# Patient Record
Sex: Female | Born: 1940 | Race: White | Hispanic: No | Smoking: Former smoker
Health system: Southern US, Community
[De-identification: ages and names within clinical notes are randomized; demographics above are authoritative.]

## PROBLEM LIST (undated history)

## (undated) DIAGNOSIS — E785 Hyperlipidemia, unspecified: Secondary | ICD-10-CM

## (undated) DIAGNOSIS — F329 Major depressive disorder, single episode, unspecified: Secondary | ICD-10-CM

## (undated) DIAGNOSIS — F419 Anxiety disorder, unspecified: Secondary | ICD-10-CM

## (undated) DIAGNOSIS — I499 Cardiac arrhythmia, unspecified: Secondary | ICD-10-CM

## (undated) DIAGNOSIS — I110 Hypertensive heart disease with heart failure: Secondary | ICD-10-CM

## (undated) DIAGNOSIS — Z720 Tobacco use: Secondary | ICD-10-CM

## (undated) DIAGNOSIS — I5032 Chronic diastolic (congestive) heart failure: Secondary | ICD-10-CM

## (undated) DIAGNOSIS — J449 Chronic obstructive pulmonary disease, unspecified: Secondary | ICD-10-CM

## (undated) DIAGNOSIS — I10 Essential (primary) hypertension: Secondary | ICD-10-CM

## (undated) DIAGNOSIS — E119 Type 2 diabetes mellitus without complications: Secondary | ICD-10-CM

## (undated) DIAGNOSIS — K219 Gastro-esophageal reflux disease without esophagitis: Secondary | ICD-10-CM

## (undated) DIAGNOSIS — I480 Paroxysmal atrial fibrillation: Secondary | ICD-10-CM

## (undated) DIAGNOSIS — F32A Depression, unspecified: Secondary | ICD-10-CM

## (undated) DIAGNOSIS — N289 Disorder of kidney and ureter, unspecified: Secondary | ICD-10-CM

## (undated) DIAGNOSIS — R251 Tremor, unspecified: Secondary | ICD-10-CM

## (undated) HISTORY — DX: Hyperlipidemia, unspecified: E78.5

## (undated) HISTORY — PX: NOSE SURGERY: SHX723

## (undated) HISTORY — DX: Hypertensive heart disease with heart failure: I11.0

## (undated) HISTORY — DX: Paroxysmal atrial fibrillation: I48.0

## (undated) HISTORY — DX: Chronic obstructive pulmonary disease, unspecified: J44.9

## (undated) HISTORY — PX: ABDOMINAL HYSTERECTOMY: SHX81

## (undated) HISTORY — DX: Essential (primary) hypertension: I10

## (undated) HISTORY — DX: Gastro-esophageal reflux disease without esophagitis: K21.9

## (undated) HISTORY — DX: Tobacco use: Z72.0

## (undated) HISTORY — DX: Chronic diastolic (congestive) heart failure: I50.32

---

## 2010-10-25 HISTORY — PX: CORONARY ARTERY BYPASS GRAFT: SHX141

## 2011-07-12 DIAGNOSIS — I2510 Atherosclerotic heart disease of native coronary artery without angina pectoris: Secondary | ICD-10-CM

## 2011-08-25 ENCOUNTER — Ambulatory Visit (INDEPENDENT_AMBULATORY_CARE_PROVIDER_SITE_OTHER): Payer: Medicare Other | Admitting: Physician Assistant

## 2011-08-25 ENCOUNTER — Encounter: Payer: Self-pay | Admitting: Thoracic Surgery (Cardiothoracic Vascular Surgery)

## 2011-08-25 DIAGNOSIS — Z951 Presence of aortocoronary bypass graft: Secondary | ICD-10-CM

## 2011-08-25 DIAGNOSIS — I2510 Atherosclerotic heart disease of native coronary artery without angina pectoris: Secondary | ICD-10-CM

## 2011-08-25 NOTE — Progress Notes (Signed)
[name] return to the office today for scheduled followup after three-vessel coronary bypass grafting by Dr. 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 She has a history of congestive heart failure at the time of her left heart catheterization. She also has a history of atrial fibrillation. Her postoperative course was uneventful. She was discharged home on amiodarone, aspirin, Lipitor, Coreg, hydrochlorothiazide, quinapril, and her pulmonary drugs for COPD including sertraline and Singulair. Since her discharge home she is made steady progress. She continues to have some shortness of breath with activity but she reports this is not unlike her symptoms prior to the operation and are related to her long-standing smoking history. She denies any angina. Her appetite is good.  On exam today her heart is in a regular rate and rhythm. She has a Holter monitor attached which has been in place for the past 6 days and is being evaluated by Dr. mildly. Her amiodarone was discontinued one week ago. Her breath sounds are clear to auscultation. The sternotomy incision is intact healing with no complications. She has no peripheral edema. Plan sternal precautions are reviewed. No further followup scheduled with her office. She is scheduled to see Dr. mildly next week. I asked her to contact us if we can be of any further assistance with her care.

## 2011-09-28 ENCOUNTER — Encounter: Payer: Self-pay | Admitting: *Deleted

## 2014-10-25 HISTORY — PX: CATARACT EXTRACTION: SUR2

## 2015-06-09 DIAGNOSIS — I110 Hypertensive heart disease with heart failure: Secondary | ICD-10-CM | POA: Insufficient documentation

## 2015-06-09 DIAGNOSIS — I480 Paroxysmal atrial fibrillation: Secondary | ICD-10-CM

## 2015-06-09 DIAGNOSIS — I5032 Chronic diastolic (congestive) heart failure: Secondary | ICD-10-CM

## 2015-06-09 HISTORY — DX: Hypertensive heart disease with heart failure: I11.0

## 2015-06-09 HISTORY — DX: Paroxysmal atrial fibrillation: I48.0

## 2015-06-09 HISTORY — DX: Chronic diastolic (congestive) heart failure: I50.32

## 2016-01-05 DIAGNOSIS — R413 Other amnesia: Secondary | ICD-10-CM | POA: Diagnosis not present

## 2016-01-05 DIAGNOSIS — Z0000 Encounter for general adult medical examination without abnormal findings: Secondary | ICD-10-CM | POA: Diagnosis not present

## 2016-01-05 DIAGNOSIS — Z6837 Body mass index (BMI) 37.0-37.9, adult: Secondary | ICD-10-CM | POA: Diagnosis not present

## 2016-01-05 DIAGNOSIS — F39 Unspecified mood [affective] disorder: Secondary | ICD-10-CM | POA: Diagnosis not present

## 2016-01-05 DIAGNOSIS — I10 Essential (primary) hypertension: Secondary | ICD-10-CM | POA: Diagnosis not present

## 2016-01-05 DIAGNOSIS — E119 Type 2 diabetes mellitus without complications: Secondary | ICD-10-CM | POA: Diagnosis not present

## 2016-01-05 DIAGNOSIS — Z1231 Encounter for screening mammogram for malignant neoplasm of breast: Secondary | ICD-10-CM | POA: Diagnosis not present

## 2016-01-05 DIAGNOSIS — E559 Vitamin D deficiency, unspecified: Secondary | ICD-10-CM | POA: Diagnosis not present

## 2016-01-05 DIAGNOSIS — E785 Hyperlipidemia, unspecified: Secondary | ICD-10-CM | POA: Diagnosis not present

## 2016-01-05 DIAGNOSIS — Z1389 Encounter for screening for other disorder: Secondary | ICD-10-CM | POA: Diagnosis not present

## 2016-01-05 DIAGNOSIS — K219 Gastro-esophageal reflux disease without esophagitis: Secondary | ICD-10-CM | POA: Diagnosis not present

## 2016-01-15 DIAGNOSIS — Z1231 Encounter for screening mammogram for malignant neoplasm of breast: Secondary | ICD-10-CM | POA: Diagnosis not present

## 2016-01-26 DIAGNOSIS — R928 Other abnormal and inconclusive findings on diagnostic imaging of breast: Secondary | ICD-10-CM | POA: Diagnosis not present

## 2016-01-26 DIAGNOSIS — N6489 Other specified disorders of breast: Secondary | ICD-10-CM | POA: Diagnosis not present

## 2016-01-26 DIAGNOSIS — N641 Fat necrosis of breast: Secondary | ICD-10-CM | POA: Diagnosis not present

## 2016-05-06 DIAGNOSIS — Z6836 Body mass index (BMI) 36.0-36.9, adult: Secondary | ICD-10-CM | POA: Diagnosis not present

## 2016-05-06 DIAGNOSIS — M1990 Unspecified osteoarthritis, unspecified site: Secondary | ICD-10-CM | POA: Diagnosis not present

## 2016-05-06 DIAGNOSIS — M79675 Pain in left toe(s): Secondary | ICD-10-CM | POA: Diagnosis not present

## 2016-05-06 DIAGNOSIS — E119 Type 2 diabetes mellitus without complications: Secondary | ICD-10-CM | POA: Diagnosis not present

## 2016-05-06 DIAGNOSIS — S92532A Displaced fracture of distal phalanx of left lesser toe(s), initial encounter for closed fracture: Secondary | ICD-10-CM | POA: Diagnosis not present

## 2016-05-06 DIAGNOSIS — E669 Obesity, unspecified: Secondary | ICD-10-CM | POA: Diagnosis not present

## 2016-05-06 DIAGNOSIS — I10 Essential (primary) hypertension: Secondary | ICD-10-CM | POA: Diagnosis not present

## 2016-05-06 DIAGNOSIS — X58XXXA Exposure to other specified factors, initial encounter: Secondary | ICD-10-CM | POA: Diagnosis not present

## 2016-05-06 DIAGNOSIS — E785 Hyperlipidemia, unspecified: Secondary | ICD-10-CM | POA: Diagnosis not present

## 2016-05-06 DIAGNOSIS — E1140 Type 2 diabetes mellitus with diabetic neuropathy, unspecified: Secondary | ICD-10-CM | POA: Diagnosis not present

## 2016-05-24 DIAGNOSIS — S92502A Displaced unspecified fracture of left lesser toe(s), initial encounter for closed fracture: Secondary | ICD-10-CM | POA: Diagnosis not present

## 2016-05-28 DIAGNOSIS — Z13820 Encounter for screening for osteoporosis: Secondary | ICD-10-CM | POA: Diagnosis not present

## 2016-05-28 DIAGNOSIS — M810 Age-related osteoporosis without current pathological fracture: Secondary | ICD-10-CM | POA: Diagnosis not present

## 2016-06-02 DIAGNOSIS — R270 Ataxia, unspecified: Secondary | ICD-10-CM | POA: Diagnosis not present

## 2016-06-02 DIAGNOSIS — S92912A Unspecified fracture of left toe(s), initial encounter for closed fracture: Secondary | ICD-10-CM | POA: Diagnosis not present

## 2016-06-02 DIAGNOSIS — E1140 Type 2 diabetes mellitus with diabetic neuropathy, unspecified: Secondary | ICD-10-CM | POA: Diagnosis not present

## 2016-06-02 DIAGNOSIS — Z6836 Body mass index (BMI) 36.0-36.9, adult: Secondary | ICD-10-CM | POA: Diagnosis not present

## 2016-06-02 DIAGNOSIS — R252 Cramp and spasm: Secondary | ICD-10-CM | POA: Diagnosis not present

## 2016-06-14 DIAGNOSIS — Z9181 History of falling: Secondary | ICD-10-CM | POA: Diagnosis not present

## 2016-06-14 DIAGNOSIS — R270 Ataxia, unspecified: Secondary | ICD-10-CM | POA: Diagnosis not present

## 2016-06-29 DIAGNOSIS — Z9181 History of falling: Secondary | ICD-10-CM | POA: Diagnosis not present

## 2016-06-29 DIAGNOSIS — R270 Ataxia, unspecified: Secondary | ICD-10-CM | POA: Diagnosis not present

## 2016-07-08 DIAGNOSIS — I480 Paroxysmal atrial fibrillation: Secondary | ICD-10-CM | POA: Diagnosis not present

## 2016-07-08 DIAGNOSIS — Z951 Presence of aortocoronary bypass graft: Secondary | ICD-10-CM | POA: Diagnosis not present

## 2016-07-08 DIAGNOSIS — I110 Hypertensive heart disease with heart failure: Secondary | ICD-10-CM | POA: Diagnosis not present

## 2016-07-08 DIAGNOSIS — I5032 Chronic diastolic (congestive) heart failure: Secondary | ICD-10-CM | POA: Diagnosis not present

## 2016-07-26 DIAGNOSIS — R270 Ataxia, unspecified: Secondary | ICD-10-CM | POA: Diagnosis not present

## 2016-07-26 DIAGNOSIS — Z9181 History of falling: Secondary | ICD-10-CM | POA: Diagnosis not present

## 2016-08-10 DIAGNOSIS — J019 Acute sinusitis, unspecified: Secondary | ICD-10-CM | POA: Diagnosis not present

## 2016-08-10 DIAGNOSIS — M1990 Unspecified osteoarthritis, unspecified site: Secondary | ICD-10-CM | POA: Diagnosis not present

## 2016-08-10 DIAGNOSIS — J209 Acute bronchitis, unspecified: Secondary | ICD-10-CM | POA: Diagnosis not present

## 2016-08-10 DIAGNOSIS — Z6835 Body mass index (BMI) 35.0-35.9, adult: Secondary | ICD-10-CM | POA: Diagnosis not present

## 2016-08-10 DIAGNOSIS — M791 Myalgia: Secondary | ICD-10-CM | POA: Diagnosis not present

## 2016-08-10 DIAGNOSIS — E669 Obesity, unspecified: Secondary | ICD-10-CM | POA: Diagnosis not present

## 2016-09-02 DIAGNOSIS — I10 Essential (primary) hypertension: Secondary | ICD-10-CM | POA: Diagnosis not present

## 2016-09-02 DIAGNOSIS — E559 Vitamin D deficiency, unspecified: Secondary | ICD-10-CM | POA: Diagnosis not present

## 2016-09-02 DIAGNOSIS — Z79899 Other long term (current) drug therapy: Secondary | ICD-10-CM | POA: Diagnosis not present

## 2016-09-02 DIAGNOSIS — E669 Obesity, unspecified: Secondary | ICD-10-CM | POA: Diagnosis not present

## 2016-09-02 DIAGNOSIS — Z6835 Body mass index (BMI) 35.0-35.9, adult: Secondary | ICD-10-CM | POA: Diagnosis not present

## 2016-09-02 DIAGNOSIS — M1990 Unspecified osteoarthritis, unspecified site: Secondary | ICD-10-CM | POA: Diagnosis not present

## 2016-09-02 DIAGNOSIS — Z23 Encounter for immunization: Secondary | ICD-10-CM | POA: Diagnosis not present

## 2016-09-02 DIAGNOSIS — J309 Allergic rhinitis, unspecified: Secondary | ICD-10-CM | POA: Diagnosis not present

## 2016-09-02 DIAGNOSIS — E1140 Type 2 diabetes mellitus with diabetic neuropathy, unspecified: Secondary | ICD-10-CM | POA: Diagnosis not present

## 2016-10-12 DIAGNOSIS — H905 Unspecified sensorineural hearing loss: Secondary | ICD-10-CM | POA: Diagnosis not present

## 2016-10-12 DIAGNOSIS — H9193 Unspecified hearing loss, bilateral: Secondary | ICD-10-CM | POA: Diagnosis not present

## 2016-10-27 DIAGNOSIS — H9072 Mixed conductive and sensorineural hearing loss, unilateral, left ear, with unrestricted hearing on the contralateral side: Secondary | ICD-10-CM | POA: Diagnosis not present

## 2016-10-27 DIAGNOSIS — H903 Sensorineural hearing loss, bilateral: Secondary | ICD-10-CM | POA: Diagnosis not present

## 2017-01-03 DIAGNOSIS — E1140 Type 2 diabetes mellitus with diabetic neuropathy, unspecified: Secondary | ICD-10-CM | POA: Diagnosis not present

## 2017-01-03 DIAGNOSIS — J449 Chronic obstructive pulmonary disease, unspecified: Secondary | ICD-10-CM | POA: Diagnosis not present

## 2017-01-03 DIAGNOSIS — I10 Essential (primary) hypertension: Secondary | ICD-10-CM | POA: Diagnosis not present

## 2017-01-03 DIAGNOSIS — E669 Obesity, unspecified: Secondary | ICD-10-CM | POA: Diagnosis not present

## 2017-01-03 DIAGNOSIS — E785 Hyperlipidemia, unspecified: Secondary | ICD-10-CM | POA: Diagnosis not present

## 2017-01-03 DIAGNOSIS — M1990 Unspecified osteoarthritis, unspecified site: Secondary | ICD-10-CM | POA: Diagnosis not present

## 2017-01-03 DIAGNOSIS — Z6835 Body mass index (BMI) 35.0-35.9, adult: Secondary | ICD-10-CM | POA: Diagnosis not present

## 2017-01-03 DIAGNOSIS — Z79899 Other long term (current) drug therapy: Secondary | ICD-10-CM | POA: Diagnosis not present

## 2017-01-03 DIAGNOSIS — Z9181 History of falling: Secondary | ICD-10-CM | POA: Diagnosis not present

## 2017-01-03 DIAGNOSIS — R413 Other amnesia: Secondary | ICD-10-CM | POA: Diagnosis not present

## 2017-01-03 DIAGNOSIS — E559 Vitamin D deficiency, unspecified: Secondary | ICD-10-CM | POA: Diagnosis not present

## 2017-01-03 DIAGNOSIS — J309 Allergic rhinitis, unspecified: Secondary | ICD-10-CM | POA: Diagnosis not present

## 2017-05-09 DIAGNOSIS — E119 Type 2 diabetes mellitus without complications: Secondary | ICD-10-CM | POA: Diagnosis not present

## 2017-05-09 DIAGNOSIS — F4321 Adjustment disorder with depressed mood: Secondary | ICD-10-CM | POA: Diagnosis not present

## 2017-05-09 DIAGNOSIS — E1140 Type 2 diabetes mellitus with diabetic neuropathy, unspecified: Secondary | ICD-10-CM | POA: Diagnosis not present

## 2017-05-09 DIAGNOSIS — Z6836 Body mass index (BMI) 36.0-36.9, adult: Secondary | ICD-10-CM | POA: Diagnosis not present

## 2017-05-09 DIAGNOSIS — E559 Vitamin D deficiency, unspecified: Secondary | ICD-10-CM | POA: Diagnosis not present

## 2017-05-09 DIAGNOSIS — J309 Allergic rhinitis, unspecified: Secondary | ICD-10-CM | POA: Diagnosis not present

## 2017-05-09 DIAGNOSIS — Z1389 Encounter for screening for other disorder: Secondary | ICD-10-CM | POA: Diagnosis not present

## 2017-05-09 DIAGNOSIS — E669 Obesity, unspecified: Secondary | ICD-10-CM | POA: Diagnosis not present

## 2017-05-09 DIAGNOSIS — K219 Gastro-esophageal reflux disease without esophagitis: Secondary | ICD-10-CM | POA: Diagnosis not present

## 2017-05-09 DIAGNOSIS — I2510 Atherosclerotic heart disease of native coronary artery without angina pectoris: Secondary | ICD-10-CM | POA: Diagnosis not present

## 2017-05-09 DIAGNOSIS — Z9181 History of falling: Secondary | ICD-10-CM | POA: Diagnosis not present

## 2017-05-09 DIAGNOSIS — E785 Hyperlipidemia, unspecified: Secondary | ICD-10-CM | POA: Diagnosis not present

## 2017-05-13 DIAGNOSIS — E785 Hyperlipidemia, unspecified: Secondary | ICD-10-CM | POA: Insufficient documentation

## 2017-05-13 DIAGNOSIS — I10 Essential (primary) hypertension: Secondary | ICD-10-CM | POA: Insufficient documentation

## 2017-05-13 DIAGNOSIS — K219 Gastro-esophageal reflux disease without esophagitis: Secondary | ICD-10-CM | POA: Insufficient documentation

## 2017-05-13 DIAGNOSIS — I6529 Occlusion and stenosis of unspecified carotid artery: Secondary | ICD-10-CM | POA: Insufficient documentation

## 2017-05-13 DIAGNOSIS — Z720 Tobacco use: Secondary | ICD-10-CM | POA: Insufficient documentation

## 2017-05-13 DIAGNOSIS — J449 Chronic obstructive pulmonary disease, unspecified: Secondary | ICD-10-CM | POA: Insufficient documentation

## 2017-05-26 ENCOUNTER — Other Ambulatory Visit: Payer: Self-pay

## 2017-05-29 ENCOUNTER — Encounter: Payer: Self-pay | Admitting: Cardiology

## 2017-05-29 DIAGNOSIS — I25119 Atherosclerotic heart disease of native coronary artery with unspecified angina pectoris: Secondary | ICD-10-CM

## 2017-05-29 HISTORY — DX: Atherosclerotic heart disease of native coronary artery with unspecified angina pectoris: I25.119

## 2017-05-30 ENCOUNTER — Encounter: Payer: Self-pay | Admitting: Cardiology

## 2017-05-30 ENCOUNTER — Ambulatory Visit (INDEPENDENT_AMBULATORY_CARE_PROVIDER_SITE_OTHER): Payer: PPO | Admitting: Cardiology

## 2017-05-30 VITALS — BP 140/52 | HR 61 | Ht 65.0 in | Wt 217.0 lb

## 2017-05-30 DIAGNOSIS — I480 Paroxysmal atrial fibrillation: Secondary | ICD-10-CM

## 2017-05-30 DIAGNOSIS — I5032 Chronic diastolic (congestive) heart failure: Secondary | ICD-10-CM | POA: Diagnosis not present

## 2017-05-30 DIAGNOSIS — I25119 Atherosclerotic heart disease of native coronary artery with unspecified angina pectoris: Secondary | ICD-10-CM | POA: Diagnosis not present

## 2017-05-30 DIAGNOSIS — I110 Hypertensive heart disease with heart failure: Secondary | ICD-10-CM | POA: Diagnosis not present

## 2017-05-30 MED ORDER — TORSEMIDE 20 MG PO TABS: 20.0000 mg | ORAL_TABLET | Freq: Every day | ORAL | 3 refills | Status: DC

## 2017-05-30 NOTE — Patient Instructions (Addendum)
Medication Instructions:  Your physician has recommended you make the following change in your medication:  STOP furosemide STOP hydrochlorothiazide START torsemide (Demadex) 20 mg daily   Labwork: None  Testing/Procedures: None  Follow-Up: Your physician wants you to follow-up in: 6 months. You will receive a reminder letter in the mail two months in advance. If you don't receive a letter, please call our office to schedule the follow-up appointment.   Any Other Special Instructions Will Be Listed Below (If Applicable).     If you need a refill on your cardiac medications before your next appointment, please call your pharmacy.    KNOW YOUR HEART FAILURE ZONES  Green Zone (Your Goal):  Marland Kitchen No shortness of breath or trouble bleeding . No weight gain of more than 3 pounds in one day or 5 pounds in a week . No swelling in your ankles, feet, stomach, or hands . No chest discomfort, heaviness or pain  Yellow Zone (Call the Doctor to get help!): Having 1 or more of the following: . Weight gain of 3 pounds in 1 day or 5 pounds in 1 week . More swelling of your feet, ankles, stomach, or hands . It is harder to breath when lying down. You need to sit up . Chest discomfort, heaviness, or pain . You feel more tired or have less energy than normal . New or worsening dizziness . Dry hacking cough . You feel uneasy and you know something is not right  Red Zone (Call 911-EMERGENCY): . Struggling to breathe. This does not go away when you sit up. . Stronger and more regular amounts of chest discomfort . New confusion or can't think clearly . Fainting or near fainting   Resources form the American heart Association: RetroDivas.ch.jsp

## 2017-05-30 NOTE — Progress Notes (Signed)
Cardiology Office Note:    Date:  05/30/2017   ID:  Cheryl Summers, DOB [birth year], MRN [id number]  PCP:  Nicholos Johns, MD  Cardiologist:  Cheryl More, MD    Referring MD: Nicholos Johns, MD    ASSESSMENT:    1. Chronic diastolic heart failure ([place])   2. Hypertensive heart disease with heart failure ([place])   3. PAF (paroxysmal atrial fibrillation) ([place])   4. Coronary artery disease involving native coronary artery of native heart with angina pectoris ([place])    PLAN:    In order of problems listed above:  1. Mildly decompensated she be switched once daily Summers potent diuretic and initiate sodium restriction compliance of medications and self-management with daily home weights 2. Stable continue current antihypertensive including beta blocker ACE inhibitor 3. Stable no clinical recurrence continue her beta blocker and aspirin for stroke prophylaxis 4. Stable course continue medical therapy aspirin or statin beta blocker   Next appointment: 6 months   Medication Adjustments/Labs and Tests Ordered: Current medicines are reviewed at length with the patient today.  Concerns regarding medicines are outlined above.  No orders of the defined types were placed in this encounter.  Meds ordered this encounter  Medications  . torsemide (DEMADEX) 20 MG tablet    Sig: Take 1 tablet (20 mg total) by mouth daily.    Dispense:  90 tablet    Refill:  3    Chief Complaint  Patient presents with  . Annual Exam    No concerns     History of Present Illness:    Cheryl Summers is a 76 y.o. female with a hx of CAD with CABG in 2012,, CHF, Atrial Fibrillation-CHADS2 vasc score=5 last seen September 2017.She is not anticoagulated due to gait dysfunction and falls. Because he urinary frequency she often skips her diuretics does not fully sodium restrict and does not weigh herself. Despite this she has little or no edema shortness of breath orthopnea PND chest pain palpitation or syncope. I've  negotiated with her that we would simplify her diuretics stopping hydrochlorothiazide and switching to once daily torsemide and that she would initiate lifestyle modification. Compliance with diet, lifestyle and medications: No Past Medical History:  Diagnosis Date  . Carotid artery stenosis, asymptomatic    50% ASYMPTOMATIC  RIGHT CAROTID STENOSIS  . Chronic diastolic heart failure ([place]) 06/09/2015  . COPD (chronic obstructive pulmonary disease) ([place])   . Gastroesophageal reflux   . Hyperlipidemia   . Hypertension   . Hypertensive heart disease with heart failure ([place]) 06/09/2015  . PAF (paroxysmal atrial fibrillation) ([place]) 06/09/2015   Overview:  CHADS2 vasc score=6  . Tobacco abuse     Past Surgical History:  Procedure Laterality Date  . ABDOMINAL HYSTERECTOMY    . CORONARY ARTERY BYPASS GRAFT  2012  . NOSE SURGERY      Current Medications: Current Meds  Medication Sig  . amLODipine (NORVASC) 10 MG tablet Take 10 mg by mouth daily.    Marland Kitchen aspirin (GOODSENSE ASPIRIN) 325 MG tablet Take 325 mg by mouth daily.  Marland Kitchen atorvastatin (LIPITOR) 20 MG tablet Take 20 mg by mouth at bedtime.   . carvedilol (COREG) 12.5 MG tablet Take 25 mg by mouth 2 (two) times daily.  Marland Kitchen donepezil (ARICEPT) 10 MG tablet Take 10 mg by mouth at bedtime.  Marland Kitchen ezetimibe (ZETIA) 10 MG tablet Take 10 mg by mouth daily.  Marland Kitchen losartan-hydrochlorothiazide (HYZAAR) 50-12.5 MG tablet Take 1 tablet by mouth daily.  Marland Kitchen  metFORMIN (GLUCOPHAGE) 500 MG tablet Take 250 mg by mouth 2 (two) times daily.  . Multiple Vitamin (MULTIVITAMIN) tablet Take 1 tablet by mouth daily.  . MULTIPLE VITAMINS-CALCIUM PO Take 1 capsule by mouth daily.  . pregabalin (LYRICA) 100 MG capsule Take 100 mg by mouth daily.  . quinapril (ACCUPRIL) 40 MG tablet Take 40 mg by mouth daily.    . sertraline (ZOLOFT) 50 MG tablet Take 50 mg by mouth daily.    . traMADol (ULTRAM) 50 MG tablet Take 50 mg by mouth every 6 (six) hours as needed.  . Vitamin D,  Ergocalciferol, (DRISDOL) 50000 units CAPS capsule Take 5,000 Units by mouth once a week.  . [DISCONTINUED] furosemide (LASIX) 20 MG tablet Take 20 mg by mouth 2 (two) times daily.  . [DISCONTINUED] hydrochlorothiazide (HYDRODIURIL) 25 MG tablet Take 25 mg by mouth 2 (two) times daily.   . [DISCONTINUED] hydrochlorothiazide (HYDRODIURIL) 25 MG tablet Take 25 mg by mouth 2 (two) times daily.     Allergies:   Patient has no known allergies.   Social History   Social History  . Marital status: Married    Spouse name: N/A  . Number of children: N/A  . Years of education: N/A   Social History Main Topics  . Smoking status: Former Research scientist (life sciences)  . Smokeless tobacco: Never Used  . Alcohol use No  . Drug use: No  . Sexual activity: Not Asked   Other Topics Concern  . None   Social History Narrative  . None     Family History: The patient's family history includes Heart attack in her brother, father, mother, and sister; Hyperlipidemia in her father and mother; Hypertension in her daughter, father, and mother; Stroke in her brother. ROS:   Please see the history of present illness.    All other systems reviewed and are negative.  EKGs/Labs/Other Studies Reviewed:    The following studies were reviewed today:   Recent Labs: No results found for requested labs within last 8760 hours.  Recent Lipid Panel No results found for: CHOL, TRIG, HDL, CHOLHDL, VLDL, LDLCALC, LDLDIRECT  Physical Exam:    VS:  BP (!) 140/52   Pulse 61   Ht 5\' 5"  (1.651 m)   Wt 217 lb (98.4 kg)   SpO2 94%   BMI 36.11 kg/m     Wt Readings from Last 3 Encounters:  05/30/17 217 lb (98.4 kg)     GEN:  Well nourished, well developed in no acute distress HEENT: Normal NECK: No JVD; No carotid bruits LYMPHATICS: No lymphadenopathy CARDIAC: RRR, no murmurs, rubs, gallops RESPIRATORY:  Clear to auscultation without rales, wheezing or rhonchi  ABDOMEN: Soft, non-tender, non-distended MUSCULOSKELETAL:  1-2+  edema; No deformity  SKIN: Warm and dry NEUROLOGIC:  Alert and oriented x 3 PSYCHIATRIC:  Normal affect    Signed, Cheryl More, MD  05/30/2017 5:24 PM    [place]

## 2017-06-10 DIAGNOSIS — L720 Epidermal cyst: Secondary | ICD-10-CM | POA: Diagnosis not present

## 2017-06-10 DIAGNOSIS — C44629 Squamous cell carcinoma of skin of left upper limb, including shoulder: Secondary | ICD-10-CM | POA: Diagnosis not present

## 2017-06-10 DIAGNOSIS — L821 Other seborrheic keratosis: Secondary | ICD-10-CM | POA: Diagnosis not present

## 2017-06-15 DIAGNOSIS — Z9181 History of falling: Secondary | ICD-10-CM | POA: Diagnosis not present

## 2017-06-15 DIAGNOSIS — Z6837 Body mass index (BMI) 37.0-37.9, adult: Secondary | ICD-10-CM | POA: Diagnosis not present

## 2017-06-15 DIAGNOSIS — Z1389 Encounter for screening for other disorder: Secondary | ICD-10-CM | POA: Diagnosis not present

## 2017-06-15 DIAGNOSIS — Z0000 Encounter for general adult medical examination without abnormal findings: Secondary | ICD-10-CM | POA: Diagnosis not present

## 2017-06-15 DIAGNOSIS — E785 Hyperlipidemia, unspecified: Secondary | ICD-10-CM | POA: Diagnosis not present

## 2017-06-21 DIAGNOSIS — D0462 Carcinoma in situ of skin of left upper limb, including shoulder: Secondary | ICD-10-CM | POA: Diagnosis not present

## 2017-07-27 DIAGNOSIS — Z6837 Body mass index (BMI) 37.0-37.9, adult: Secondary | ICD-10-CM | POA: Diagnosis not present

## 2017-07-27 DIAGNOSIS — E669 Obesity, unspecified: Secondary | ICD-10-CM | POA: Diagnosis not present

## 2017-07-27 DIAGNOSIS — R197 Diarrhea, unspecified: Secondary | ICD-10-CM | POA: Diagnosis not present

## 2017-08-01 DIAGNOSIS — R197 Diarrhea, unspecified: Secondary | ICD-10-CM | POA: Diagnosis not present

## 2017-08-19 DIAGNOSIS — Z23 Encounter for immunization: Secondary | ICD-10-CM | POA: Diagnosis not present

## 2017-09-08 DIAGNOSIS — R0602 Shortness of breath: Secondary | ICD-10-CM | POA: Diagnosis not present

## 2017-09-08 DIAGNOSIS — Z79899 Other long term (current) drug therapy: Secondary | ICD-10-CM | POA: Diagnosis not present

## 2017-09-08 DIAGNOSIS — J449 Chronic obstructive pulmonary disease, unspecified: Secondary | ICD-10-CM | POA: Diagnosis not present

## 2017-09-08 DIAGNOSIS — E669 Obesity, unspecified: Secondary | ICD-10-CM | POA: Diagnosis not present

## 2017-09-08 DIAGNOSIS — Z951 Presence of aortocoronary bypass graft: Secondary | ICD-10-CM | POA: Diagnosis not present

## 2017-09-08 DIAGNOSIS — K529 Noninfective gastroenteritis and colitis, unspecified: Secondary | ICD-10-CM | POA: Diagnosis not present

## 2017-09-08 DIAGNOSIS — Z6837 Body mass index (BMI) 37.0-37.9, adult: Secondary | ICD-10-CM | POA: Diagnosis not present

## 2017-09-08 DIAGNOSIS — J309 Allergic rhinitis, unspecified: Secondary | ICD-10-CM | POA: Diagnosis not present

## 2017-09-22 DIAGNOSIS — I509 Heart failure, unspecified: Secondary | ICD-10-CM | POA: Diagnosis not present

## 2017-09-22 DIAGNOSIS — J45909 Unspecified asthma, uncomplicated: Secondary | ICD-10-CM | POA: Diagnosis not present

## 2017-09-22 DIAGNOSIS — R0602 Shortness of breath: Secondary | ICD-10-CM | POA: Diagnosis not present

## 2017-09-22 DIAGNOSIS — E669 Obesity, unspecified: Secondary | ICD-10-CM | POA: Diagnosis not present

## 2017-09-22 DIAGNOSIS — R270 Ataxia, unspecified: Secondary | ICD-10-CM | POA: Diagnosis not present

## 2017-09-22 DIAGNOSIS — E1140 Type 2 diabetes mellitus with diabetic neuropathy, unspecified: Secondary | ICD-10-CM | POA: Diagnosis not present

## 2017-09-22 DIAGNOSIS — J309 Allergic rhinitis, unspecified: Secondary | ICD-10-CM | POA: Diagnosis not present

## 2017-09-22 DIAGNOSIS — Z6837 Body mass index (BMI) 37.0-37.9, adult: Secondary | ICD-10-CM | POA: Diagnosis not present

## 2017-09-26 DIAGNOSIS — I509 Heart failure, unspecified: Secondary | ICD-10-CM | POA: Diagnosis not present

## 2017-09-26 DIAGNOSIS — J449 Chronic obstructive pulmonary disease, unspecified: Secondary | ICD-10-CM | POA: Diagnosis not present

## 2017-09-26 DIAGNOSIS — M1991 Primary osteoarthritis, unspecified site: Secondary | ICD-10-CM | POA: Diagnosis not present

## 2017-09-26 DIAGNOSIS — Z7982 Long term (current) use of aspirin: Secondary | ICD-10-CM | POA: Diagnosis not present

## 2017-09-26 DIAGNOSIS — Z602 Problems related to living alone: Secondary | ICD-10-CM | POA: Diagnosis not present

## 2017-09-26 DIAGNOSIS — R197 Diarrhea, unspecified: Secondary | ICD-10-CM | POA: Diagnosis not present

## 2017-09-26 DIAGNOSIS — Z951 Presence of aortocoronary bypass graft: Secondary | ICD-10-CM | POA: Diagnosis not present

## 2017-09-26 DIAGNOSIS — E669 Obesity, unspecified: Secondary | ICD-10-CM | POA: Diagnosis not present

## 2017-09-26 DIAGNOSIS — F4321 Adjustment disorder with depressed mood: Secondary | ICD-10-CM | POA: Diagnosis not present

## 2017-09-26 DIAGNOSIS — I2510 Atherosclerotic heart disease of native coronary artery without angina pectoris: Secondary | ICD-10-CM | POA: Diagnosis not present

## 2017-09-26 DIAGNOSIS — E1140 Type 2 diabetes mellitus with diabetic neuropathy, unspecified: Secondary | ICD-10-CM | POA: Diagnosis not present

## 2017-09-26 DIAGNOSIS — Z6837 Body mass index (BMI) 37.0-37.9, adult: Secondary | ICD-10-CM | POA: Diagnosis not present

## 2017-09-26 DIAGNOSIS — Z87891 Personal history of nicotine dependence: Secondary | ICD-10-CM | POA: Diagnosis not present

## 2017-09-26 DIAGNOSIS — E559 Vitamin D deficiency, unspecified: Secondary | ICD-10-CM | POA: Diagnosis not present

## 2017-09-26 DIAGNOSIS — K219 Gastro-esophageal reflux disease without esophagitis: Secondary | ICD-10-CM | POA: Diagnosis not present

## 2017-09-26 DIAGNOSIS — I110 Hypertensive heart disease with heart failure: Secondary | ICD-10-CM | POA: Diagnosis not present

## 2017-09-27 DIAGNOSIS — I700 Atherosclerosis of aorta: Secondary | ICD-10-CM | POA: Diagnosis not present

## 2017-09-27 DIAGNOSIS — J449 Chronic obstructive pulmonary disease, unspecified: Secondary | ICD-10-CM | POA: Diagnosis not present

## 2017-09-27 DIAGNOSIS — E041 Nontoxic single thyroid nodule: Secondary | ICD-10-CM | POA: Diagnosis not present

## 2017-09-27 DIAGNOSIS — I509 Heart failure, unspecified: Secondary | ICD-10-CM | POA: Diagnosis not present

## 2017-09-30 DIAGNOSIS — E041 Nontoxic single thyroid nodule: Secondary | ICD-10-CM | POA: Diagnosis not present

## 2017-10-10 DIAGNOSIS — E669 Obesity, unspecified: Secondary | ICD-10-CM | POA: Diagnosis not present

## 2017-10-10 DIAGNOSIS — I509 Heart failure, unspecified: Secondary | ICD-10-CM | POA: Diagnosis not present

## 2017-10-10 DIAGNOSIS — E041 Nontoxic single thyroid nodule: Secondary | ICD-10-CM | POA: Diagnosis not present

## 2017-10-10 DIAGNOSIS — Z6836 Body mass index (BMI) 36.0-36.9, adult: Secondary | ICD-10-CM | POA: Diagnosis not present

## 2017-10-10 DIAGNOSIS — J309 Allergic rhinitis, unspecified: Secondary | ICD-10-CM | POA: Diagnosis not present

## 2017-10-20 ENCOUNTER — Other Ambulatory Visit: Payer: Self-pay

## 2017-10-20 MED ORDER — EZETIMIBE 10 MG PO TABS: 10.0000 mg | ORAL_TABLET | Freq: Every day | ORAL | 1 refills | Status: DC

## 2017-10-28 DIAGNOSIS — J449 Chronic obstructive pulmonary disease, unspecified: Secondary | ICD-10-CM | POA: Diagnosis not present

## 2017-11-28 DIAGNOSIS — J449 Chronic obstructive pulmonary disease, unspecified: Secondary | ICD-10-CM | POA: Diagnosis not present

## 2017-12-20 DIAGNOSIS — E1140 Type 2 diabetes mellitus with diabetic neuropathy, unspecified: Secondary | ICD-10-CM | POA: Diagnosis not present

## 2017-12-20 DIAGNOSIS — R413 Other amnesia: Secondary | ICD-10-CM | POA: Diagnosis not present

## 2017-12-20 DIAGNOSIS — M5440 Lumbago with sciatica, unspecified side: Secondary | ICD-10-CM | POA: Diagnosis not present

## 2017-12-20 DIAGNOSIS — E669 Obesity, unspecified: Secondary | ICD-10-CM | POA: Diagnosis not present

## 2017-12-20 DIAGNOSIS — M1990 Unspecified osteoarthritis, unspecified site: Secondary | ICD-10-CM | POA: Diagnosis not present

## 2017-12-20 DIAGNOSIS — Z6837 Body mass index (BMI) 37.0-37.9, adult: Secondary | ICD-10-CM | POA: Diagnosis not present

## 2017-12-20 DIAGNOSIS — I10 Essential (primary) hypertension: Secondary | ICD-10-CM | POA: Diagnosis not present

## 2017-12-20 DIAGNOSIS — Z79899 Other long term (current) drug therapy: Secondary | ICD-10-CM | POA: Diagnosis not present

## 2017-12-26 DIAGNOSIS — J449 Chronic obstructive pulmonary disease, unspecified: Secondary | ICD-10-CM | POA: Diagnosis not present

## 2017-12-28 DIAGNOSIS — M5440 Lumbago with sciatica, unspecified side: Secondary | ICD-10-CM | POA: Diagnosis not present

## 2017-12-28 DIAGNOSIS — M545 Low back pain: Secondary | ICD-10-CM | POA: Diagnosis not present

## 2018-01-18 ENCOUNTER — Ambulatory Visit (INDEPENDENT_AMBULATORY_CARE_PROVIDER_SITE_OTHER): Payer: PPO | Admitting: Specialist

## 2018-01-18 ENCOUNTER — Encounter (INDEPENDENT_AMBULATORY_CARE_PROVIDER_SITE_OTHER): Payer: Self-pay | Admitting: Specialist

## 2018-01-18 ENCOUNTER — Ambulatory Visit (INDEPENDENT_AMBULATORY_CARE_PROVIDER_SITE_OTHER): Payer: PPO

## 2018-01-18 VITALS — BP 112/50 | HR 46 | Ht 65.0 in | Wt 216.0 lb

## 2018-01-18 DIAGNOSIS — G8929 Other chronic pain: Secondary | ICD-10-CM

## 2018-01-18 DIAGNOSIS — M4316 Spondylolisthesis, lumbar region: Secondary | ICD-10-CM

## 2018-01-18 DIAGNOSIS — R55 Syncope and collapse: Secondary | ICD-10-CM

## 2018-01-18 DIAGNOSIS — I70209 Unspecified atherosclerosis of native arteries of extremities, unspecified extremity: Secondary | ICD-10-CM

## 2018-01-18 DIAGNOSIS — R001 Bradycardia, unspecified: Secondary | ICD-10-CM | POA: Diagnosis not present

## 2018-01-18 DIAGNOSIS — M48062 Spinal stenosis, lumbar region with neurogenic claudication: Secondary | ICD-10-CM | POA: Diagnosis not present

## 2018-01-18 DIAGNOSIS — M5441 Lumbago with sciatica, right side: Secondary | ICD-10-CM

## 2018-01-18 DIAGNOSIS — I739 Peripheral vascular disease, unspecified: Secondary | ICD-10-CM | POA: Diagnosis not present

## 2018-01-18 DIAGNOSIS — E669 Obesity, unspecified: Secondary | ICD-10-CM | POA: Diagnosis not present

## 2018-01-18 DIAGNOSIS — R252 Cramp and spasm: Secondary | ICD-10-CM | POA: Diagnosis not present

## 2018-01-18 DIAGNOSIS — R413 Other amnesia: Secondary | ICD-10-CM | POA: Diagnosis not present

## 2018-01-18 DIAGNOSIS — Z6837 Body mass index (BMI) 37.0-37.9, adult: Secondary | ICD-10-CM | POA: Diagnosis not present

## 2018-01-18 DIAGNOSIS — Z79899 Other long term (current) drug therapy: Secondary | ICD-10-CM | POA: Diagnosis not present

## 2018-01-18 MED ORDER — GABAPENTIN 100 MG PO CAPS: 100.0000 mg | ORAL_CAPSULE | Freq: Every day | ORAL | 3 refills | Status: DC

## 2018-01-18 NOTE — Patient Instructions (Addendum)
Avoid bending, stooping and avoid lifting weights greater than 10 lbs. Avoid prolong standing and walking. Avoid frequent bending and stooping  No lifting greater than 10 lbs. May use ice or moist heat for pain. Weight loss is of benefit. Handicap license is approved. Want to have your heart assessed for concerns about syncope and dizziness. Also will schedule for tests the arteries of your legs for perpheral artery disease. Recommend physical therapy with home health referral. Avoid backwards bending exercises.  Avoid NSAIDs as these may cause a worsening of the current heart condition. Arthritis strength tylenol 3-4 times per day. Gabapentin 100mg  qhs for leg.

## 2018-01-18 NOTE — Progress Notes (Signed)
Office Visit Note   Patient: Cheryl Summers           Date of Birth: 1941/05/22           MRN: [id number] Visit Date: 01/18/2018              Requested by: Nicholos Johns, MD [address], [place] [address] PCP: Nicholos Johns, MD   Assessment & Plan: Visit Diagnoses:  1. Chronic right-sided low back pain with right-sided sciatica   2. Spondylolisthesis, lumbar region   3. Spinal stenosis of lumbar region with neurogenic claudication     Plan: Avoid bending, stooping and avoid lifting weights greater than 10 lbs. Avoid prolong standing and walking. Avoid frequent bending and stooping  No lifting greater than 10 lbs. May use ice or moist heat for pain. Weight loss is of benefit. Handicap license is approved. Want to have your heart assessed for concerns about syncope and dizziness. Also will schedule for tests the arteries of your legs for perpheral artery disease. Recommend physical therap with home health referral.y. Avoid backwards bending exercises.  Avoid NSAIDs as these may cause a worsening of the current heart condition. Arthritis strength tylenol 3-4 times per day. Gabapentin 100mg  qhs for leg.  Follow-Up Instructions: Return in about 1 month (around 02/15/2018).   Orders:  Orders Placed This Encounter  Procedures  . XR Lumbar Spine 2-3 Views   No orders of the defined types were placed in this encounter.     Procedures: No procedures performed   Clinical Data: No additional findings.   Subjective: Chief Complaint  Patient presents with  . Lower Back - Pain  . Results    MRI    77 year old female with long history of low back pain since a fall in a parking lot at a [facility] where she was walking on an icey day on a incline surface. She has been having worsening pain over the last 3 months especially, daily complaints of back and leg pain to her family. She lives alone with a daughter living next door. She has more recently Missed her bills  and had her water cut off. There are concern about her loss of memory.  Her alarm system and water utility are not being paid on time. She is having trouble  Making it to the mail box and back. Daughter notes last weekend she tried to walk across her back yard to see her favorite plant and had to stop and go back inside.  She has a History of CABG x 3 at [facility], 07/13/2011 by Cardiothoracic surgeon there, her cardiologist is Dr. Bettina Gavia in [place] with [place] [facility] [employer]. Her primary care physician is Kris Hartmann MD with [facility] Has decreased capacity to walk and stand. She fell last fall tripping on her vaacum cord and she fell two summers ago trying to pull some weeds. She has been depressed with loss of 2 of 4 children, due to aneursym and colon and liver cancer. Dr. Anders Grant gave hip injections one month ago during her appointment in the right hip and placed on steroids without help. She has no difficulty reaching her shoes and socks. Not lifing anything heavy, she has to lie on the sink, leaning forward to wash her dishes. Her daughter is having to do grocery shopping, does have to lean on the cart. She has difficulty standing in one place for  Not much more than 10-15 minutes  with leaning on the sink, probably not 5 minutes with being upright. Pain is in the right low back and into the right buttock. Has diabetes and her have bothered her for last 5-8 years due to neuropathy.Her sugar is occasionally out of control, last check called to her by her primary care was at a prediabetic level. She is on Farciga 10 mg qday in AM with breakfast. Sitting down with relief of pain, she can sit all day without difficulty. No artery tests on the legs. She has smoked in the past Up until 2012 when she stopped 1ppd has smoked 60 pack years.  Bowel and bladder function are not changing, she has some stress incontinence.    Review of Systems  Constitutional: Positive for activity change  and unexpected weight change. Negative for appetite change, chills, diaphoresis, fatigue and fever.  HENT: Positive for hearing loss. Negative for congestion, dental problem, drooling, ear discharge, ear pain, facial swelling, mouth sores, nosebleeds, postnasal drip, rhinorrhea, sinus pressure, sinus pain, sneezing, sore throat, tinnitus, trouble swallowing and voice change.   Eyes: Positive for itching and visual disturbance. Negative for photophobia, pain, discharge and redness.  Respiratory: Positive for shortness of breath and wheezing. Negative for apnea, cough, choking, chest tightness and stridor.   Cardiovascular: Negative.  Negative for chest pain, palpitations and leg swelling.  Gastrointestinal: Negative.  Negative for abdominal distention, abdominal pain, blood in stool, constipation, diarrhea, nausea, rectal pain and vomiting.  Endocrine: Negative.  Negative for cold intolerance and heat intolerance.  Genitourinary: Negative.  Negative for difficulty urinating, dyspareunia, dysuria, enuresis, flank pain, frequency and hematuria.  Musculoskeletal: Positive for arthralgias, back pain, gait problem and joint swelling. Negative for myalgias, neck pain and neck stiffness.  Skin: Negative.  Negative for color change, pallor, rash and wound.  Allergic/Immunologic: Positive for food allergies and immunocompromised state. Negative for environmental allergies.  Neurological: Positive for dizziness, weakness, light-headedness and numbness. Negative for tremors, seizures, syncope, facial asymmetry, speech difficulty and headaches.  Hematological: Negative for adenopathy. Bruises/bleeds easily.  Psychiatric/Behavioral: Negative for agitation, behavioral problems, confusion, decreased concentration, dysphoric mood, hallucinations, self-injury, sleep disturbance and suicidal ideas. The patient is nervous/anxious. The patient is not hyperactive.      Objective: Vital Signs: BP (!) 112/50   Pulse (!)  46   Ht 5\' 5"  (1.651 m)   Wt 216 lb (98 kg)   BMI 35.94 kg/m   Physical Exam  Constitutional: She is oriented to person, place, and time. She appears well-developed and well-nourished.  HENT:  Head: Normocephalic and atraumatic.  Eyes: Pupils are equal, round, and reactive to light. EOM are normal.  Neck: Normal range of motion. Neck supple.  Pulmonary/Chest: Effort normal and breath sounds normal.  Abdominal: Soft. Bowel sounds are normal.  Neurological: She is alert and oriented to person, place, and time.  Skin: Skin is warm and dry.  Psychiatric: She has a normal mood and affect. Her behavior is normal. Judgment and thought content normal.    Back Exam   Tenderness  The patient is experiencing tenderness in the lumbar.  Range of Motion  Extension: abnormal  Flexion: normal  Lateral bend right: normal  Lateral bend left: normal  Rotation right: normal  Rotation left: normal   Muscle Strength  Right Quadriceps:  5/5  Left Quadriceps:  5/5  Right Hamstrings:  5/5  Left Hamstrings:  5/5   Tests  Straight leg raise right: negative Straight leg raise left: negative  Reflexes  Patellar: normal Achilles:  0/4 Biceps: normal Babinski's sign: normal   Other  Toe walk: abnormal Heel walk: abnormal Sensation: normal Gait: normal  Erythema: no back redness Scars: absent  Comments:  Hip ROM is normal, pain free.      Specialty Comments:  No specialty comments available.  Imaging: Xr Lumbar Spine 2-3 Views  Result Date: 01/18/2018 Lumbar standing Ap and lateral flexion and extension radiographs with Grade 1 spondylolisthesis L4-5, DDD with disc narrowing L1-2, L2-3, L4-5. The L3-4 disc is mildly narrowing. No change in slip with flexion and extension.    PMFS History: Patient Active Problem List   Diagnosis Date Noted  . Coronary artery disease involving native coronary artery of native heart with angina pectoris ([place]) 05/29/2017  . Tobacco abuse  05/13/2017  . Hypertension 05/13/2017  . Hyperlipidemia 05/13/2017  . GERD (gastroesophageal reflux disease) 05/13/2017  . COPD (chronic obstructive pulmonary disease) ([place]) 05/13/2017  . Carotid artery stenosis 05/13/2017  . Chronic diastolic heart failure ([place]) 06/09/2015  . Hypertensive heart disease with heart failure ([place]) 06/09/2015  . PAF (paroxysmal atrial fibrillation) ([place]) 06/09/2015   Past Medical History:  Diagnosis Date  . Carotid artery stenosis, asymptomatic    50% ASYMPTOMATIC  RIGHT CAROTID STENOSIS  . Chronic diastolic heart failure ([place]) 06/09/2015  . COPD (chronic obstructive pulmonary disease) ([place])   . Gastroesophageal reflux   . Hyperlipidemia   . Hypertension   . Hypertensive heart disease with heart failure ([place]) 06/09/2015  . PAF (paroxysmal atrial fibrillation) ([place]) 06/09/2015   Overview:  CHADS2 vasc score=6  . Tobacco abuse     Family History  Problem Relation Age of Onset  . Heart attack Mother   . Hypertension Mother   . Hyperlipidemia Mother   . Heart attack Father   . Hypertension Father   . Hyperlipidemia Father   . Heart attack Sister   . Stroke Brother   . Heart attack Brother   . Hypertension Daughter     Past Surgical History:  Procedure Laterality Date  . ABDOMINAL HYSTERECTOMY    . CORONARY ARTERY BYPASS GRAFT  2012  . NOSE SURGERY     Social History   Occupational History  . Not on file  Tobacco Use  . Smoking status: Former Research scientist (life sciences)  . Smokeless tobacco: Never Used  Substance and Sexual Activity  . Alcohol use: No  . Drug use: No  . Sexual activity: Not on file

## 2018-01-19 ENCOUNTER — Ambulatory Visit (INDEPENDENT_AMBULATORY_CARE_PROVIDER_SITE_OTHER): Payer: PPO | Admitting: Cardiology

## 2018-01-19 ENCOUNTER — Encounter: Payer: Self-pay | Admitting: Cardiology

## 2018-01-19 ENCOUNTER — Other Ambulatory Visit: Payer: Self-pay | Admitting: *Deleted

## 2018-01-19 VITALS — BP 164/68 | HR 56 | Ht 65.0 in | Wt 215.8 lb

## 2018-01-19 DIAGNOSIS — R001 Bradycardia, unspecified: Secondary | ICD-10-CM

## 2018-01-19 DIAGNOSIS — I480 Paroxysmal atrial fibrillation: Secondary | ICD-10-CM | POA: Diagnosis not present

## 2018-01-19 DIAGNOSIS — I25119 Atherosclerotic heart disease of native coronary artery with unspecified angina pectoris: Secondary | ICD-10-CM | POA: Diagnosis not present

## 2018-01-19 DIAGNOSIS — I110 Hypertensive heart disease with heart failure: Secondary | ICD-10-CM

## 2018-01-19 DIAGNOSIS — E785 Hyperlipidemia, unspecified: Secondary | ICD-10-CM

## 2018-01-19 DIAGNOSIS — I5032 Chronic diastolic (congestive) heart failure: Secondary | ICD-10-CM

## 2018-01-19 HISTORY — DX: Bradycardia, unspecified: R00.1

## 2018-01-19 MED ORDER — TELMISARTAN 40 MG PO TABS: 40.0000 mg | ORAL_TABLET | Freq: Every day | ORAL | 3 refills | Status: DC

## 2018-01-19 NOTE — Telephone Encounter (Signed)
Patient's daughter, [name], called saying that [facility] is out of telmisartan so prescription sent to [facility] in [place].

## 2018-01-19 NOTE — Progress Notes (Signed)
Cardiology Office Note:    Date:  01/19/2018   ID:  Cheryl Summers, DOB 12-Nov-1940, MRN [id number]  PCP:  Nicholos Johns, MD  Cardiologist:  Shirlee More, MD    Referring MD: Nicholos Johns, MD    ASSESSMENT:    1. Bradycardia   2. PAF (paroxysmal atrial fibrillation) ([place])   3. Chronic diastolic heart failure ([place])   4. Hypertensive heart disease with heart failure ([place])   5. Coronary artery disease involving native coronary artery of native heart with angina pectoris ([place])   6. Hyperlipidemia, unspecified hyperlipidemia type    PLAN:    In order of problems listed above:  1. Improved she will stop her beta-blocker 2. Stable remains in sinus rhythm 3. Compensated continue current diuretic 4. Her daughter will check blood pressure at home may require additional antihypertensive off carvedilol.  She takes losartan was stopped and switched to a more potent ARB myocarditis in view of her elevated blood pressure today. 5. Stable continue medical treatment 6. Stable continue her statin recent labs requested from PCP   Next appointment: One month   Medication Adjustments/Labs and Tests Ordered: Current medicines are reviewed at length with the patient today.  Concerns regarding medicines are outlined above.  No orders of the defined types were placed in this encounter.  No orders of the defined types were placed in this encounter.   Chief Complaint  Patient presents with  . Follow-up    per Dr Rica Records to evaulate bradycardia and dizziness    History of Present Illness:    Cheryl Summers is a 77 y.o. female with a hx of CAD with CABG in 2012,, CHF, Atrial Fibrillation-CHADS2 vasc score=5.She is not anticoagulated due to gait dysfunction and falls last seen 05/30/17.She called and requested to be seen today for a slow HR.  ASSESSMENT:    05/30/17   1. Chronic diastolic heart failure ([place])   2. Hypertensive heart disease with heart failure ([place])   3. PAF (paroxysmal atrial  fibrillation) ([place])   4. Coronary artery disease involving native coronary artery of native heart with angina pectoris ([place])    PLAN:    In order of problems listed above:  1. Mildly decompensated she be switched once daily more potent diuretic and initiate sodium restriction compliance of medications and self-management with daily home weights 2. Stable continue current antihypertensive including beta blocker ACE inhibitor 3. Stable no clinical recurrence continue her beta blocker and aspirin for stroke prophylaxis 4. Stable course continue medical therapy aspirin or statin beta blocker   Compliance with diet, lifestyle and medications: Yes She was seeing a back surgeon yesterday was noted to have rate of 48 symptoms here PCP beta-blocker was stopped.  Fortunately she has had no palpitations syncope chest pain shortness of breath.  Her daughter is present she is also taking Aricept that can potentiate bradycardia and still has sinus bradycardia although the rate is greater than 50 today.  Improved she will stop her beta-blocker Past Medical History:  Diagnosis Date  . Carotid artery stenosis, asymptomatic    50% ASYMPTOMATIC  RIGHT CAROTID STENOSIS  . Chronic diastolic heart failure ([place]) 06/09/2015  . COPD (chronic obstructive pulmonary disease) ([place])   . Gastroesophageal reflux   . Hyperlipidemia   . Hypertension   . Hypertensive heart disease with heart failure ([place]) 06/09/2015  . PAF (paroxysmal atrial fibrillation) ([place]) 06/09/2015   Overview:  CHADS2 vasc score=6  . Tobacco abuse     Past Surgical History:  Procedure Laterality Date  . ABDOMINAL HYSTERECTOMY    . CORONARY ARTERY BYPASS GRAFT  2012  . NOSE SURGERY      Current Medications: Current Meds  Medication Sig  . albuterol (PROVENTIL HFA;VENTOLIN HFA) 108 (90 Base) MCG/ACT inhaler Inhale 2 puffs into the lungs every 6 (six) hours as needed for wheezing or shortness of breath.  Marland Kitchen amLODipine (NORVASC) 10 MG  tablet Take 10 mg by mouth daily.    Marland Kitchen aspirin (GOODSENSE ASPIRIN) 325 MG tablet Take 325 mg by mouth daily.  Marland Kitchen atorvastatin (LIPITOR) 20 MG tablet Take 20 mg by mouth daily.  . budesonide-formoterol (SYMBICORT) 160-4.5 MCG/ACT inhaler Inhale 2 puffs into the lungs 2 (two) times daily.  Marland Kitchen buPROPion (WELLBUTRIN XL) 150 MG 24 hr tablet Take 150 mg by mouth daily.  . dapagliflozin propanediol (FARXIGA) 10 MG TABS tablet Take 10 mg by mouth daily.  Marland Kitchen donepezil (ARICEPT) 10 MG tablet Take 10 mg by mouth at bedtime.  Marland Kitchen ezetimibe (ZETIA) 10 MG tablet Take 5 mg by mouth daily.   . fluticasone furoate-vilanterol (BREO ELLIPTA) 200-25 MCG/INH AEPB Inhale 1 puff into the lungs daily.  Marland Kitchen gabapentin (NEURONTIN) 100 MG capsule Take 1 capsule (100 mg total) by mouth at bedtime.  . montelukast (SINGULAIR) 10 MG tablet Take 10 mg by mouth daily.  . Multiple Vitamin (MULTIVITAMIN) tablet Take 1 tablet by mouth daily.  Marland Kitchen omeprazole (PRILOSEC) 20 MG capsule Take 20 mg by mouth daily. prn  . pregabalin (LYRICA) 100 MG capsule Take 100 mg by mouth daily.  . quinapril (ACCUPRIL) 40 MG tablet Take 40 mg by mouth daily.    . sertraline (ZOLOFT) 50 MG tablet Take 50 mg by mouth daily.    Marland Kitchen tiZANidine (ZANAFLEX) 2 MG tablet Take 2 mg by mouth once. Once evrey 12 hr.  . torsemide (DEMADEX) 20 MG tablet Take 1 tablet (20 mg total) by mouth daily.  . [DISCONTINUED] losartan-hydrochlorothiazide (HYZAAR) 50-12.5 MG tablet Take 1 tablet by mouth daily.     Allergies:   Patient has no known allergies.   Social History   Socioeconomic History  . Marital status: Married    Spouse name: Not on file  . Number of children: Not on file  . Years of education: Not on file  . Highest education level: Not on file  Occupational History  . Not on file  Social Needs  . Financial resource strain: Not on file  . Food insecurity:    Worry: Not on file    Inability: Not on file  . Transportation needs:    Medical: Not on file      Non-medical: Not on file  Tobacco Use  . Smoking status: Former Research scientist (life sciences)  . Smokeless tobacco: Never Used  Substance and Sexual Activity  . Alcohol use: No  . Drug use: No  . Sexual activity: Not on file  Lifestyle  . Physical activity:    Days per week: Not on file    Minutes per session: Not on file  . Stress: Not on file  Relationships  . Social connections:    Talks on phone: Not on file    Gets together: Not on file    Attends religious service: Not on file    Active member of club or organization: Not on file    Attends meetings of clubs or organizations: Not on file    Relationship status: Not on file  Other Topics Concern  . Not on file  Social History  Narrative  . Not on file     Family History: The patient's family history includes Heart attack in her brother, father, mother, and sister; Hyperlipidemia in her father and mother; Hypertension in her daughter, father, and mother; Stroke in her brother. ROS:   Please see the history of present illness.    All other systems reviewed and are negative.  EKGs/Labs/Other Studies Reviewed:    The following studies were reviewed today:  EKG:  EKG ordered today.  The ekg ordered today demonstrates Sinus bradycardia 56 /min  Recent Labs: No results found for requested labs within last 8760 hours.  Recent Lipid Panel No results found for: CHOL, TRIG, HDL, CHOLHDL, VLDL, LDLCALC, LDLDIRECT  Physical Exam:    VS:  BP (!) 164/68 (BP Location: Right Arm, Patient Position: Sitting, Cuff Size: Large)   Pulse (!) 56   Ht 5\' 5"  (1.651 m)   Wt 215 lb 12.8 oz (97.9 kg)   SpO2 98%   BMI 35.91 kg/m     Wt Readings from Last 3 Encounters:  01/19/18 215 lb 12.8 oz (97.9 kg)  01/18/18 216 lb (98 kg)  05/30/17 217 lb (98.4 kg)     GEN:  Well nourished, well developed in no acute distress HEENT: Normal NECK: No JVD; No carotid bruits LYMPHATICS: No lymphadenopathy CARDIAC:  RRR, no murmurs, rubs, gallops RESPIRATORY:   Clear to auscultation without rales, wheezing or rhonchi  ABDOMEN: Soft, non-tender, non-distended MUSCULOSKELETAL:  No edema; No deformity  SKIN: Warm and dry NEUROLOGIC:  Alert and oriented x 3 PSYCHIATRIC:  Normal affect    Signed, Shirlee More, MD  01/19/2018 4:00 PM    [facility]

## 2018-01-19 NOTE — Patient Instructions (Addendum)
Medication Instructions:  Your physician has recommended you make the following change in your medication:  STOP carvedilol START telmisartan (micardis) 40 mg daily  Labwork: None  Testing/Procedures: You had an EKG today.  Your physician has recommended that you wear an event monitor. Event monitors are medical devices that record the heart's electrical activity. Doctors most often [place] these monitors to diagnose arrhythmias. Arrhythmias are problems with the speed or rhythm of the heartbeat. The monitor is a small, portable device. You can wear one while you do your normal daily activities. This is usually used to diagnose what is causing palpitations/syncope (passing out). Wear for 2 weeks.     Follow-Up: Your physician recommends that you schedule a follow-up appointment in: 1 month.   Any Other Special Instructions Will Be Listed Below (If Applicable).     If you need a refill on your cardiac medications before your next appointment, please call your pharmacy.

## 2018-01-26 DIAGNOSIS — J449 Chronic obstructive pulmonary disease, unspecified: Secondary | ICD-10-CM | POA: Diagnosis not present

## 2018-01-27 ENCOUNTER — Telehealth: Payer: Self-pay | Admitting: Cardiology

## 2018-01-27 ENCOUNTER — Telehealth (INDEPENDENT_AMBULATORY_CARE_PROVIDER_SITE_OTHER): Payer: Self-pay

## 2018-01-27 NOTE — Telephone Encounter (Signed)
Patient's daughter, [name], states Dr. Bettina Gavia took her off tramadol and started her on gabapentin. The past couple of days, the patient has been really shaky.  When looking into the patient's chart. Dr. Louanne Skye made this change. Patient was seen by Dr. Louanne Skye the day before Dr. Bettina Gavia. Marylou Mccoy to call Dr. Otho Ket office for advice. Verbalized understanding, no further questions.

## 2018-01-27 NOTE — Telephone Encounter (Signed)
Has meeting until 2:30, please call after regarding pain medicine

## 2018-01-27 NOTE — Telephone Encounter (Signed)
Patient's daughter [name]) called to let us know that since the patient started the Gabapentin 100 mg hs, she has had "trembles."  She did not have this symptom on the Tramadol.  Epimenio Sarin told her mom to stop the Gabapentin and go back on the Tramadol. She checked on her mom today, who reported the trembles were not as bad.  Should she just stay on the Tramadol, try the Gabapentin and Tylenol again, or try something else? Please advise.

## 2018-01-30 NOTE — Telephone Encounter (Signed)
Patient's daughter [name]) called to let us know that since the patient started the Gabapentin 100 mg hs, she has had "trembles."  She did not have this symptom on the Tramadol.  Epimenio Sarin told her mom to stop the Gabapentin and go back on the Tramadol. She checked on her mom today, who reported the trembles were not as bad.  Should she just stay on the Tramadol, try the Gabapentin and Tylenol again, or try something else? Please advise.

## 2018-02-02 ENCOUNTER — Ambulatory Visit: Payer: PPO

## 2018-02-02 DIAGNOSIS — I480 Paroxysmal atrial fibrillation: Secondary | ICD-10-CM | POA: Diagnosis not present

## 2018-02-02 DIAGNOSIS — R001 Bradycardia, unspecified: Secondary | ICD-10-CM | POA: Diagnosis not present

## 2018-02-03 NOTE — Telephone Encounter (Signed)
Extremely low dose of gabapentin but she may be intolerant so stop the gabapentin. jen

## 2018-02-06 NOTE — Telephone Encounter (Signed)
I called and advised Cheryl Summers, she has stopped Gabapentin but has been giving her the tramadol at night only to help her sleep.

## 2018-02-07 DIAGNOSIS — I10 Essential (primary) hypertension: Secondary | ICD-10-CM | POA: Diagnosis not present

## 2018-02-07 DIAGNOSIS — Z6837 Body mass index (BMI) 37.0-37.9, adult: Secondary | ICD-10-CM | POA: Diagnosis not present

## 2018-02-07 DIAGNOSIS — M545 Low back pain: Secondary | ICD-10-CM | POA: Diagnosis not present

## 2018-02-07 DIAGNOSIS — E669 Obesity, unspecified: Secondary | ICD-10-CM | POA: Diagnosis not present

## 2018-02-07 DIAGNOSIS — R001 Bradycardia, unspecified: Secondary | ICD-10-CM | POA: Diagnosis not present

## 2018-02-09 NOTE — Telephone Encounter (Signed)
Patients daughter called saying she just received a call from [place] and wanted to make sure it wasn't anything about her mom. If you do need to speak with her, give the daughter a call back after 2 p.m. Today # [phone]

## 2018-02-09 NOTE — Telephone Encounter (Signed)
Can you call the daughter back?

## 2018-02-10 ENCOUNTER — Ambulatory Visit: Payer: PPO | Admitting: Cardiology

## 2018-02-16 DIAGNOSIS — R413 Other amnesia: Secondary | ICD-10-CM | POA: Diagnosis not present

## 2018-02-16 DIAGNOSIS — Z6837 Body mass index (BMI) 37.0-37.9, adult: Secondary | ICD-10-CM | POA: Diagnosis not present

## 2018-02-16 DIAGNOSIS — E669 Obesity, unspecified: Secondary | ICD-10-CM | POA: Diagnosis not present

## 2018-02-16 DIAGNOSIS — N750 Cyst of Bartholin's gland: Secondary | ICD-10-CM | POA: Diagnosis not present

## 2018-02-16 DIAGNOSIS — E1140 Type 2 diabetes mellitus with diabetic neuropathy, unspecified: Secondary | ICD-10-CM | POA: Diagnosis not present

## 2018-02-20 ENCOUNTER — Ambulatory Visit: Payer: PPO | Admitting: Cardiology

## 2018-02-22 ENCOUNTER — Ambulatory Visit (INDEPENDENT_AMBULATORY_CARE_PROVIDER_SITE_OTHER): Payer: PPO | Admitting: Specialist

## 2018-02-23 NOTE — Progress Notes (Signed)
Cardiology Office Note:    Date:  02/24/2018   ID:  Cheryl Summers, DOB 10-13-1941, MRN [id number]  PCP:  Nicholos Johns, MD  Cardiologist:  Shirlee More, MD    Referring MD: Nicholos Johns, MD    ASSESSMENT:    1. Bradycardia   2. Hypertensive heart disease with heart failure ([place])   3. Chronic diastolic heart failure ([facility])    PLAN:    In order of problems listed above:  1. Improved 2. Stable blood pressure target no volume overload 3. Stable compensated continue current treatment including loop diuretic   Next appointment: 6 months   Medication Adjustments/Labs and Tests Ordered: Current medicines are reviewed at length with the patient today.  Concerns regarding medicines are outlined above.  No orders of the defined types were placed in this encounter.  No orders of the defined types were placed in this encounter.   Chief Complaint  Patient presents with  . Follow-up    bradycardia  . Hypertension    History of Present Illness:    Cheryl Summers is a 77 y.o. female with a hx of [place] CABG in 2012,,CHF, Atrial Fibrillation-CHADS2 vasc score=5.She is not anticoagulated due to gait dysfunction  last seen 01/19/18.  ASSESSMENT:    01/19/18   1. Bradycardia   2. PAF (paroxysmal atrial fibrillation) ([place])   3. Chronic diastolic heart failure ([place])   4. Hypertensive heart disease with heart failure ([place])   5. Coronary artery disease involving native coronary artery of native heart with angina pectoris ([place])   6. Hyperlipidemia, unspecified hyperlipidemia type    PLAN:    1.         Improved she will stop her beta-blocker 4. Stable remains in sinus rhythm 5. Compensated continue current diuretic 6. Her daughter will check blood pressure at home may require additional antihypertensive off carvedilol.  She takes losartan was stopped and switched to a more potent ARB myocarditis in view of her elevated blood pressure today. 7. Stable continue medical  treatment 8. Stable continue her statin recent labs requested from PCP  Compliance with diet, lifestyle and medications: She is improved heart rate and blood pressure target compliant with treatment.  Her daughter asked me if she is a candidate for back surgery and reviewed records from the surgeon he had ordered a lower extremity arterial duplex is never performed I ordered an echocardiogram to evaluate her murmur suggest aortic stenosis and left ventricular function however I think she is an acceptable operative candidate despite her comorbidities.  She has no fluid overload shortness of breath orthopnea or chest pain Past Medical History:  Diagnosis Date  . Carotid artery stenosis, asymptomatic    50% ASYMPTOMATIC  RIGHT CAROTID STENOSIS  . Chronic diastolic heart failure ([place]) 06/09/2015  . COPD (chronic obstructive pulmonary disease) ([place])   . Gastroesophageal reflux   . Hyperlipidemia   . Hypertension   . Hypertensive heart disease with heart failure ([place]) 06/09/2015  . PAF (paroxysmal atrial fibrillation) ([place]) 06/09/2015   Overview:  CHADS2 vasc score=6  . Tobacco abuse     Past Surgical History:  Procedure Laterality Date  . ABDOMINAL HYSTERECTOMY    . CORONARY ARTERY BYPASS GRAFT  2012  . NOSE SURGERY      Current Medications: Current Meds  Medication Sig  . albuterol (PROVENTIL HFA;VENTOLIN HFA) 108 (90 Base) MCG/ACT inhaler Inhale 2 puffs into the lungs every 6 (six) hours as needed for wheezing or shortness of breath.  Marland Kitchen  amLODipine (NORVASC) 10 MG tablet Take 10 mg by mouth daily.    Marland Kitchen aspirin (GOODSENSE ASPIRIN) 325 MG tablet Take 325 mg by mouth daily.  Marland Kitchen atorvastatin (LIPITOR) 20 MG tablet Take 20 mg by mouth daily.  . budesonide-formoterol (SYMBICORT) 160-4.5 MCG/ACT inhaler Inhale 2 puffs into the lungs 2 (two) times daily.  Marland Kitchen buPROPion (WELLBUTRIN XL) 150 MG 24 hr tablet Take 150 mg by mouth daily.  . dapagliflozin propanediol (FARXIGA) 10 MG TABS tablet Take 10 mg  by mouth daily.  Marland Kitchen doxycycline (DORYX) 100 MG EC tablet Take 100 mg by mouth daily.  Marland Kitchen ezetimibe (ZETIA) 10 MG tablet Take 5 mg by mouth daily.   . fluticasone furoate-vilanterol (BREO ELLIPTA) 200-25 MCG/INH AEPB Inhale 1 puff into the lungs daily.  . Memantine HCl-Donepezil HCl (NAMZARIC) 28-10 MG CP24 Take 1 capsule by mouth daily.  . montelukast (SINGULAIR) 10 MG tablet Take 10 mg by mouth daily.  . Multiple Vitamin (MULTIVITAMIN) tablet Take 1 tablet by mouth daily.  Marland Kitchen omeprazole (PRILOSEC) 20 MG capsule Take 20 mg by mouth daily. prn  . pregabalin (LYRICA) 100 MG capsule Take 100 mg by mouth daily.  . quinapril (ACCUPRIL) 40 MG tablet Take 40 mg by mouth daily.    . sertraline (ZOLOFT) 50 MG tablet Take 50 mg by mouth daily.    . Sulfamethoxazole-Trimethoprim (BACTRIM DS PO) Take 1 tablet by mouth daily.  Marland Kitchen telmisartan (MICARDIS) 40 MG tablet Take 1 tablet (40 mg total) by mouth daily.  Marland Kitchen tiZANidine (ZANAFLEX) 2 MG tablet Take 2 mg by mouth once. Once evrey 12 hr.  . torsemide (DEMADEX) 20 MG tablet Take 1 tablet (20 mg total) by mouth daily.  . traMADol (ULTRAM) 50 MG tablet Take 50 mg by mouth at bedtime.     Allergies:   Patient has no known allergies.   Social History   Socioeconomic History  . Marital status: Married    Spouse name: Not on file  . Number of children: Not on file  . Years of education: Not on file  . Highest education level: Not on file  Occupational History  . Not on file  Social Needs  . Financial resource strain: Not on file  . Food insecurity:    Worry: Not on file    Inability: Not on file  . Transportation needs:    Medical: Not on file    Non-medical: Not on file  Tobacco Use  . Smoking status: Former Research scientist (life sciences)  . Smokeless tobacco: Never Used  Substance and Sexual Activity  . Alcohol use: No  . Drug use: No  . Sexual activity: Not on file  Lifestyle  . Physical activity:    Days per week: Not on file    Minutes per session: Not on file   . Stress: Not on file  Relationships  . Social connections:    Talks on phone: Not on file    Gets together: Not on file    Attends religious service: Not on file    Active member of club or organization: Not on file    Attends meetings of clubs or organizations: Not on file    Relationship status: Not on file  Other Topics Concern  . Not on file  Social History Narrative  . Not on file     Family History: The patient's family history includes Heart attack in her brother, father, mother, and sister; Hyperlipidemia in her father and mother; Hypertension in her daughter, father, and mother;  Stroke in her brother. ROS:   Please see the history of present illness.    All other systems reviewed and are negative.  EKGs/Labs/Other Studies Reviewed:    The following studies were reviewed today:  Event Monitor: Study Highlights   Dates:                                      01/18/2018 to 02/16/2018 Indication:                              Syncope Ordering;                                Dr. Bettina Gavia Interpreting:                           Dr. Bettina Gavia Baseline transmission:             Sinus rhythm interventricular conduction delay                                                  Minimum rate 55 average rate 70 maximum rate 121 bpm Atrial arrhythmia:                    None no episodes of atrial fibrillation,  Ventricular arrhythmia:          None Conduction abnormality:       None Bradycardia:                            None Symptoms:                               10 events 1 symptomatic with lightheadedness none associated with arrhythmia Conclusion:                            Unremarkable event monitor   Recent Labs: No results found for requested labs within last 8760 hours.  Recent Lipid Panel No results found for: CHOL, TRIG, HDL, CHOLHDL, VLDL, LDLCALC, LDLDIRECT  Physical Exam:    VS:  BP (!) 90/50 (BP Location: Left Arm, Patient Position: Sitting, Cuff Size: Large)    Pulse 76   Ht 5\' 5"  (1.651 m)   Wt 215 lb (97.5 kg)   SpO2 98%   BMI 35.78 kg/m     Wt Readings from Last 3 Encounters:  02/24/18 215 lb (97.5 kg)  01/19/18 215 lb 12.8 oz (97.9 kg)  01/18/18 216 lb (98 kg)     GEN:  Well nourished, well developed in no acute distress HEENT: Normal NECK: No JVD; No carotid bruits LYMPHATICS: No lymphadenopathy CARDIAC: 2/6 SEM aortic area to carotids bilaterallyRRR, no murmurs, rubs, gallops RESPIRATORY:  Clear to auscultation without rales, wheezing or rhonchi  ABDOMEN: Soft, non-tender, non-distended MUSCULOSKELETAL:  No edema; No deformity  SKIN: Warm and dry NEUROLOGIC:  Alert and oriented x 3 PSYCHIATRIC:  Normal affect    Signed, Shirlee More, MD   02/24/2018 8:30  AM    [place]

## 2018-02-24 ENCOUNTER — Encounter: Payer: Self-pay | Admitting: Cardiology

## 2018-02-24 ENCOUNTER — Ambulatory Visit (INDEPENDENT_AMBULATORY_CARE_PROVIDER_SITE_OTHER): Payer: PPO | Admitting: Cardiology

## 2018-02-24 VITALS — BP 90/50 | HR 76 | Ht 65.0 in | Wt 215.0 lb

## 2018-02-24 DIAGNOSIS — I5032 Chronic diastolic (congestive) heart failure: Secondary | ICD-10-CM | POA: Diagnosis not present

## 2018-02-24 DIAGNOSIS — I110 Hypertensive heart disease with heart failure: Secondary | ICD-10-CM | POA: Diagnosis not present

## 2018-02-24 DIAGNOSIS — R011 Cardiac murmur, unspecified: Secondary | ICD-10-CM | POA: Insufficient documentation

## 2018-02-24 DIAGNOSIS — R001 Bradycardia, unspecified: Secondary | ICD-10-CM

## 2018-02-24 DIAGNOSIS — M79604 Pain in right leg: Secondary | ICD-10-CM | POA: Diagnosis not present

## 2018-02-24 DIAGNOSIS — M79605 Pain in left leg: Secondary | ICD-10-CM

## 2018-02-24 HISTORY — DX: Cardiac murmur, unspecified: R01.1

## 2018-02-24 NOTE — Patient Instructions (Signed)
Medication Instructions:  Your physician recommends that you continue on your current medications as directed. Please refer to the Current Medication list given to you today.  Labwork: None  Testing/Procedures: Your physician has requested that you have an echocardiogram. Echocardiography is a painless test that uses sound waves to create images of your heart. It provides your doctor with information about the size and shape of your heart and how well your heart's chambers and valves are working. This procedure takes approximately one hour. There are no restrictions for this procedure.  Your physician has requested that you have a lower extremity arterial duplex. This test is an ultrasound of the arteries in the legs. It looks at arterial blood flow in the legs. Allow one hour for Lower Arterial scans. There are no restrictions or special instructions  Follow-Up: Your physician wants you to follow-up in: 3 months. You will receive a reminder letter in the mail two months in advance. If you don't receive a letter, please call our office to schedule the follow-up appointment.  Any Other Special Instructions Will Be Listed Below (If Applicable).     If you need a refill on your cardiac medications before your next appointment, please call your pharmacy.

## 2018-02-25 DIAGNOSIS — J449 Chronic obstructive pulmonary disease, unspecified: Secondary | ICD-10-CM | POA: Diagnosis not present

## 2018-03-02 DIAGNOSIS — R42 Dizziness and giddiness: Secondary | ICD-10-CM | POA: Diagnosis not present

## 2018-03-02 DIAGNOSIS — Z6836 Body mass index (BMI) 36.0-36.9, adult: Secondary | ICD-10-CM | POA: Diagnosis not present

## 2018-03-03 ENCOUNTER — Ambulatory Visit (HOSPITAL_BASED_OUTPATIENT_CLINIC_OR_DEPARTMENT_OTHER)
Admission: RE | Admit: 2018-03-03 | Discharge: 2018-03-03 | Disposition: A | Discharge status: Home / Self Care | Payer: PPO | Source: Ambulatory Visit | Attending: Cardiology | Admitting: Cardiology

## 2018-03-03 ENCOUNTER — Ambulatory Visit (HOSPITAL_COMMUNITY)
Admission: RE | Admit: 2018-03-03 | Discharge: 2018-03-03 | Disposition: A | Discharge status: Home / Self Care | Payer: PPO | Source: Ambulatory Visit | Attending: Cardiology | Admitting: Cardiology

## 2018-03-03 ENCOUNTER — Other Ambulatory Visit: Payer: Self-pay | Admitting: Cardiology

## 2018-03-03 DIAGNOSIS — M79604 Pain in right leg: Secondary | ICD-10-CM

## 2018-03-03 DIAGNOSIS — I70201 Unspecified atherosclerosis of native arteries of extremities, right leg: Secondary | ICD-10-CM | POA: Insufficient documentation

## 2018-03-03 DIAGNOSIS — I739 Peripheral vascular disease, unspecified: Secondary | ICD-10-CM

## 2018-03-03 DIAGNOSIS — M79605 Pain in left leg: Secondary | ICD-10-CM

## 2018-03-03 DIAGNOSIS — I5032 Chronic diastolic (congestive) heart failure: Secondary | ICD-10-CM | POA: Diagnosis not present

## 2018-03-03 NOTE — Progress Notes (Signed)
ABIs and TBIs completed bilaterally. Preliminary result - Right - ABI 0.81 TBI 0.47 Left ABI 0.33 TBI 0.59. [facility], [place] 03/03/2018 3:28 PM

## 2018-03-03 NOTE — Progress Notes (Signed)
  Echocardiogram 2D Echocardiogram has been performed.  Johny Chess 03/03/2018, 2:54 PM

## 2018-03-03 NOTE — Progress Notes (Signed)
*  PRELIMINARY RESULTS* Vascular Ultrasound Left Lower Extremity Arterial Duplex has been completed.   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 No obvious evidence of hemodynamically significant stenosis involving the visualized arteries of the left lower extremity.  03/03/2018 3:32 PM Maudry Mayhew, BS, RVT, RDCS, RDMS

## 2018-03-06 ENCOUNTER — Telehealth: Payer: Self-pay

## 2018-03-06 DIAGNOSIS — I739 Peripheral vascular disease, unspecified: Secondary | ICD-10-CM

## 2018-03-06 NOTE — Telephone Encounter (Signed)
Spoke with [name] per DPR. Advised of abnormal results and need to see a vascular surgeon. [name] agreed to referral. Advised the surgeon's office would contact her to schedule an appointment.  [name] asked if this would interfere with the patient having back surgery. Marylou Mccoy would discuss with Dr. Bettina Gavia and return call. [name] verbalized understanding. No further questions.

## 2018-03-06 NOTE — Telephone Encounter (Signed)
-----   Message from Richardo Priest, MD sent at 03/04/2018  9:00 PM EDT ----- She has severe PAD. Refer to vascular surgery Dr Trula Slade

## 2018-03-06 NOTE — Telephone Encounter (Signed)
Cheryl Summers that per Dr. Bettina Gavia the patient should not have back surgery at this time. [name] verbalized understanding. No further questions.

## 2018-03-09 ENCOUNTER — Ambulatory Visit (INDEPENDENT_AMBULATORY_CARE_PROVIDER_SITE_OTHER): Payer: PPO | Admitting: Specialist

## 2018-03-09 ENCOUNTER — Encounter (INDEPENDENT_AMBULATORY_CARE_PROVIDER_SITE_OTHER): Payer: Self-pay | Admitting: Specialist

## 2018-03-09 VITALS — BP 164/67 | HR 77 | Ht 64.0 in | Wt 213.0 lb

## 2018-03-09 DIAGNOSIS — M4316 Spondylolisthesis, lumbar region: Secondary | ICD-10-CM

## 2018-03-09 DIAGNOSIS — M48062 Spinal stenosis, lumbar region with neurogenic claudication: Secondary | ICD-10-CM | POA: Diagnosis not present

## 2018-03-09 DIAGNOSIS — I70209 Unspecified atherosclerosis of native arteries of extremities, unspecified extremity: Secondary | ICD-10-CM | POA: Diagnosis not present

## 2018-03-09 MED ORDER — TRAMADOL HCL 50 MG PO TABS: 50.0000 mg | ORAL_TABLET | Freq: Four times a day (QID) | ORAL | 0 refills | Status: DC | PRN

## 2018-03-09 NOTE — Patient Instructions (Signed)
Avoid bending, stooping and avoid lifting weights greater than 10 lbs. Avoid prolong standing and walking. Avoid frequent bending and stooping  No lifting greater than 10 lbs. May use ice or moist heat for pain. Weight loss is of benefit. Handicap license is approved. Dr. Newton's secretary/Assistant will call to arrange for epidural steroid injection  

## 2018-03-09 NOTE — Progress Notes (Signed)
Office Visit Note   Patient: Cheryl Summers           Date of Birth: 12-28-1940           MRN: [id number] Visit Date: 03/09/2018              Requested by: Nicholos Johns, MD [address], [place] [id number] PCP: Nicholos Johns, MD   Assessment & Plan: Visit Diagnoses:  1. Spinal stenosis of lumbar region with neurogenic claudication   2. Spondylolisthesis, lumbar region   3. Atherosclerotic peripheral vascular disease ([place])     Plan: Avoid bending, stooping and avoid lifting weights greater than 10 lbs. Avoid prolong standing and walking. Avoid frequent bending and stooping  No lifting greater than 10 lbs. May use ice or moist heat for pain. Weight loss is of benefit. Handicap license is approved. Dr. Romona Curls secretary/Assistant will call to arrange for epidural steroid injection  Follow-Up Instructions: Return in about 3 weeks (around 03/30/2018).   Orders:  Orders Placed This Encounter  Procedures  . Ambulatory referral to Physical Medicine Rehab   Meds ordered this encounter  Medications  . traMADol (ULTRAM) 50 MG tablet    Sig: Take 1 tablet (50 mg total) by mouth every 6 (six) hours as needed for moderate pain.    Dispense:  40 tablet    Refill:  0      Procedures: No procedures performed   Clinical Data: No additional findings.   Subjective: Chief Complaint  Patient presents with  . Lower Back - Follow-up, Pain    HPI  Review of Systems   Objective: Vital Signs: BP (!) 164/67 (BP Location: Right Arm, Patient Position: Sitting, Cuff Size: Normal)   Pulse 77   Ht 5\' 4"  (1.626 m)   Wt 213 lb (96.6 kg)   BMI 36.56 kg/m   Physical Exam  Ortho Exam  Specialty Comments:  No specialty comments available.  Imaging: No results found.   PMFS History: Patient Active Problem List   Diagnosis Date Noted  . Murmur, heart 02/24/2018  . Bradycardia 01/19/2018  . Coronary artery disease involving native coronary artery of native  heart with angina pectoris ([place]) 05/29/2017  . Tobacco abuse 05/13/2017  . Hypertension 05/13/2017  . Hyperlipidemia 05/13/2017  . GERD (gastroesophageal reflux disease) 05/13/2017  . COPD (chronic obstructive pulmonary disease) ([place]) 05/13/2017  . Carotid artery stenosis 05/13/2017  . Chronic diastolic heart failure ([place]) 06/09/2015  . Hypertensive heart disease with heart failure ([place]) 06/09/2015  . PAF (paroxysmal atrial fibrillation) ([place]) 06/09/2015   Past Medical History:  Diagnosis Date  . Carotid artery stenosis, asymptomatic    50% ASYMPTOMATIC  RIGHT CAROTID STENOSIS  . Chronic diastolic heart failure ([place]) 06/09/2015  . COPD (chronic obstructive pulmonary disease) ([place])   . Gastroesophageal reflux   . Hyperlipidemia   . Hypertension   . Hypertensive heart disease with heart failure ([place]) 06/09/2015  . PAF (paroxysmal atrial fibrillation) ([place]) 06/09/2015   Overview:  CHADS2 vasc score=6  . Tobacco abuse     Family History  Problem Relation Age of Onset  . Heart attack Mother   . Hypertension Mother   . Hyperlipidemia Mother   . Heart attack Father   . Hypertension Father   . Hyperlipidemia Father   . Heart attack Sister   . Stroke Brother   . Heart attack Brother   . Hypertension Daughter     Past Surgical History:  Procedure Laterality Date  .  ABDOMINAL HYSTERECTOMY    . CORONARY ARTERY BYPASS GRAFT  2012  . NOSE SURGERY     Social History   Occupational History  . Not on file  Tobacco Use  . Smoking status: Former Research scientist (life sciences)  . Smokeless tobacco: Never Used  Substance and Sexual Activity  . Alcohol use: No  . Drug use: No  . Sexual activity: Not on file

## 2018-03-10 ENCOUNTER — Telehealth (INDEPENDENT_AMBULATORY_CARE_PROVIDER_SITE_OTHER): Payer: Self-pay | Admitting: *Deleted

## 2018-03-10 NOTE — Telephone Encounter (Signed)
I called and spoke with Cheryl Summers, and advised that I can only fill out the form based on our office visits which would be 1 a month. She states that she takes her mother to multiple appts a month with other Drs. She is going to call her HR dept and see what they advise to do about this.  I told her that I would discuss this with Cheryl Summers next week.

## 2018-03-13 NOTE — Telephone Encounter (Signed)
Please advise---Patients Daughter needs FMLA form completed, however we generally only fill out the form for OUR office, however patients daughter is requesting the form completed for all other appointments for her mother as well.

## 2018-03-16 NOTE — Telephone Encounter (Signed)
[name] said the fmla form was supposed to be faxed to her at # [phone].

## 2018-03-17 NOTE — Telephone Encounter (Signed)
I refaxed forms and rec'd confirmation for forms

## 2018-03-28 DIAGNOSIS — J449 Chronic obstructive pulmonary disease, unspecified: Secondary | ICD-10-CM | POA: Diagnosis not present

## 2018-03-30 ENCOUNTER — Ambulatory Visit (INDEPENDENT_AMBULATORY_CARE_PROVIDER_SITE_OTHER): Payer: PPO | Admitting: Specialist

## 2018-04-03 ENCOUNTER — Other Ambulatory Visit (INDEPENDENT_AMBULATORY_CARE_PROVIDER_SITE_OTHER): Payer: Self-pay | Admitting: Specialist

## 2018-04-03 ENCOUNTER — Encounter (INDEPENDENT_AMBULATORY_CARE_PROVIDER_SITE_OTHER): Payer: Self-pay | Admitting: Physical Medicine and Rehabilitation

## 2018-04-03 NOTE — Telephone Encounter (Signed)
Tramadol refill request 

## 2018-04-04 NOTE — Telephone Encounter (Signed)
This rx was NOT called into [facility], when I called to give it to them, the pharmacist states that Dr. Chipper Herb office called in an rx for tramadol 50mg ,1 po every 12 hours PRN, and it was called in this morning.

## 2018-04-04 NOTE — Telephone Encounter (Signed)
This rx was NOT called into [facility], when I called to give it to them, the pharmacist states that Dr. Chipper Herb office called in an rx for tramadol 50mg ,1 po every 12 hours PRN

## 2018-04-05 ENCOUNTER — Ambulatory Visit (INDEPENDENT_AMBULATORY_CARE_PROVIDER_SITE_OTHER): Payer: PPO | Admitting: Physical Medicine and Rehabilitation

## 2018-04-05 ENCOUNTER — Ambulatory Visit (INDEPENDENT_AMBULATORY_CARE_PROVIDER_SITE_OTHER): Payer: PPO

## 2018-04-05 ENCOUNTER — Encounter (INDEPENDENT_AMBULATORY_CARE_PROVIDER_SITE_OTHER): Payer: Self-pay | Admitting: Physical Medicine and Rehabilitation

## 2018-04-05 VITALS — BP 139/73 | HR 75

## 2018-04-05 DIAGNOSIS — M5416 Radiculopathy, lumbar region: Secondary | ICD-10-CM

## 2018-04-05 MED ORDER — BETAMETHASONE SOD PHOS & ACET 6 (3-3) MG/ML IJ SUSP: 12.0000 mg | Freq: Once | INTRAMUSCULAR | Status: DC

## 2018-04-05 NOTE — Progress Notes (Signed)
   Numeric Pain Rating Scale and Functional Assessment Average Pain 10   In the last MONTH (on 0-10 scale) has pain interfered with the following?  1. General activity like being  able to carry out your everyday physical activities such as walking, climbing stairs, carrying groceries, or moving a chair?  Rating(8)   +Driver, -BT, -Dye Allergies.  

## 2018-04-06 NOTE — Progress Notes (Signed)
Cheryl Summers - 77 y.o. female MRN [id number]  Date of birth: [birth year]  Office Visit Note: Visit Date: 04/05/2018 PCP: Nicholos Johns, MD Referred by: Nicholos Johns, MD  Subjective: Chief Complaint  Patient presents with  . Lower Back - Pain  . Right Leg - Pain  . Left Leg - Pain   HPI: Cheryl Summers is a 77 year old female who is accompanied by her daughter who provides some of the history.  She recently saw Dr. [name] who request a right L4 transforaminal injection for her low back and hip and leg pain.  The patient is having bilateral hip and leg pain consistent more with a claudication type pattern.  She has had x-ray imaging showing spondylitic change with more degenerative change at L4-5.  She has not had prior lumbar spine intervention but is failed conservative care including therapy and medication management.  She has a complicated history of coronary artery disease as well as heart failure and COPD.  We will complete bilateral L4 transforaminal injection today diagnostically and therapeutically.  Depending on her relief she really would need advanced imaging either CT of the lumbar spine or MRI before any other interventions.   ROS Otherwise per HPI.  Assessment & Plan: Visit Diagnoses:  1. Lumbar radiculopathy     Plan: No additional findings.   Meds & Orders:  Meds ordered this encounter  Medications  . betamethasone acetate-betamethasone sodium phosphate (CELESTONE) injection 12 mg    Orders Placed This Encounter  Procedures  . XR C-ARM NO REPORT  . Epidural Steroid injection    Follow-up: No follow-ups on file.   Procedures: No procedures performed  Lumbosacral Transforaminal Epidural Steroid Injection - Sub-Pedicular Approach with Fluoroscopic Guidance  Patient: Cheryl Summers      Date of Birth: 04/06/1941 MRN: [id number] PCP: Nicholos Johns, MD      Visit Date: 04/05/2018   Universal Protocol:    Date/Time: 04/05/2018  Consent Given By: the  patient  Position: PRONE  Additional Comments: Vital signs were monitored before and after the procedure. Patient was prepped and draped in the usual sterile fashion. The correct patient, procedure, and site was verified.   Injection Procedure Details:  Procedure Site One Meds Administered:  Meds ordered this encounter  Medications  . betamethasone acetate-betamethasone sodium phosphate (CELESTONE) injection 12 mg    Laterality: Bilateral  Location/Site:  L4-L5  Needle size: 22 G  Needle type: Spinal  Needle Placement: Transforaminal  Findings:    -Comments: Excellent flow of contrast along the nerve and into the epidural space.  Procedure Details: After squaring off the end-plates to get a true AP view, the C-arm was positioned so that an oblique view of the foramen as noted above was visualized. The target area is just inferior to the "nose of the scotty dog" or sub pedicular. The soft tissues overlying this structure were infiltrated with 2-3 ml. of 1% Lidocaine without Epinephrine.  The spinal needle was inserted toward the target using a "trajectory" view along the fluoroscope beam.  Under AP and lateral visualization, the needle was advanced so it did not puncture dura and was located close the 6 O'Clock position of the pedical in AP tracterory. Biplanar projections were used to confirm position. Aspiration was confirmed to be negative for CSF and/or blood. A 1-2 ml. volume of Isovue-250 was injected and flow of contrast was noted at each level. Radiographs were obtained for documentation purposes.   After attaining the desired flow of contrast  documented above, a 0.5 to 1.0 ml test dose of 0.25% Marcaine was injected into each respective transforaminal space.  The patient was observed for 90 seconds post injection.  After no sensory deficits were reported, and normal lower extremity motor function was noted,   the above injectate was administered so that equal amounts of  the injectate were placed at each foramen (level) into the transforaminal epidural space.   Additional Comments:  The patient tolerated the procedure well Dressing: Band-Aid    Post-procedure details: Patient was observed during the procedure. Post-procedure instructions were reviewed.  Patient left the clinic in stable condition.    Clinical History: No specialty comments available.   She reports that she has quit smoking. She has never used smokeless tobacco. No results for input(s): HGBA1C, LABURIC in the last 8760 hours.  Objective:  VS:  HT:    WT:   BMI:     BP:139/73  HR:75bpm  TEMP: ( )  RESP:  Physical Exam  Ortho Exam Imaging: Xr C-arm No Report  Result Date: 04/05/2018 Please see Notes or Procedures tab for imaging impression.   Past Medical/Family/Surgical/Social History: Medications & Allergies reviewed per EMR, new medications updated. Patient Active Problem List   Diagnosis Date Noted  . Murmur, heart 02/24/2018  . Bradycardia 01/19/2018  . Coronary artery disease involving native coronary artery of native heart with angina pectoris ([place]) 05/29/2017  . Tobacco abuse 05/13/2017  . Hypertension 05/13/2017  . Hyperlipidemia 05/13/2017  . GERD (gastroesophageal reflux disease) 05/13/2017  . COPD (chronic obstructive pulmonary disease) ([place]) 05/13/2017  . Carotid artery stenosis 05/13/2017  . Chronic diastolic heart failure ([place]) 06/09/2015  . Hypertensive heart disease with heart failure ([place]) 06/09/2015  . PAF (paroxysmal atrial fibrillation) ([place]) 06/09/2015   Past Medical History:  Diagnosis Date  . Carotid artery stenosis, asymptomatic    50% ASYMPTOMATIC  RIGHT CAROTID STENOSIS  . Chronic diastolic heart failure ([place]) 06/09/2015  . COPD (chronic obstructive pulmonary disease) ([place])   . Gastroesophageal reflux   . Hyperlipidemia   . Hypertension   . Hypertensive heart disease with heart failure ([place]) 06/09/2015  . PAF (paroxysmal atrial  fibrillation) ([place]) 06/09/2015   Overview:  CHADS2 vasc score=6  . Tobacco abuse    Family History  Problem Relation Age of Onset  . Heart attack Mother   . Hypertension Mother   . Hyperlipidemia Mother   . Heart attack Father   . Hypertension Father   . Hyperlipidemia Father   . Heart attack Sister   . Stroke Brother   . Heart attack Brother   . Hypertension Daughter    Past Surgical History:  Procedure Laterality Date  . ABDOMINAL HYSTERECTOMY    . CORONARY ARTERY BYPASS GRAFT  2012  . NOSE SURGERY     Social History   Occupational History  . Not on file  Tobacco Use  . Smoking status: Former Research scientist (life sciences)  . Smokeless tobacco: Never Used  Substance and Sexual Activity  . Alcohol use: No  . Drug use: No  . Sexual activity: Not on file

## 2018-04-06 NOTE — Procedures (Signed)
Lumbosacral Transforaminal Epidural Steroid Injection - Sub-Pedicular Approach with Fluoroscopic Guidance  Patient: Cheryl Summers      Date of Birth: 10-03-1941 MRN: [id number] PCP: Nicholos Johns, MD      Visit Date: 04/05/2018   Universal Protocol:    Date/Time: 04/05/2018  Consent Given By: the patient  Position: PRONE  Additional Comments: Vital signs were monitored before and after the procedure. Patient was prepped and draped in the usual sterile fashion. The correct patient, procedure, and site was verified.   Injection Procedure Details:  Procedure Site One Meds Administered:  Meds ordered this encounter  Medications  . betamethasone acetate-betamethasone sodium phosphate (CELESTONE) injection 12 mg    Laterality: Bilateral  Location/Site:  L4-L5  Needle size: 22 G  Needle type: Spinal  Needle Placement: Transforaminal  Findings:    -Comments: Excellent flow of contrast along the nerve and into the epidural space.  Procedure Details: After squaring off the end-plates to get a true AP view, the C-arm was positioned so that an oblique view of the foramen as noted above was visualized. The target area is just inferior to the "nose of the scotty dog" or sub pedicular. The soft tissues overlying this structure were infiltrated with 2-3 ml. of 1% Lidocaine without Epinephrine.  The spinal needle was inserted toward the target using a "trajectory" view along the fluoroscope beam.  Under AP and lateral visualization, the needle was advanced so it did not puncture dura and was located close the 6 O'Clock position of the pedical in AP tracterory. Biplanar projections were used to confirm position. Aspiration was confirmed to be negative for CSF and/or blood. A 1-2 ml. volume of Isovue-250 was injected and flow of contrast was noted at each level. Radiographs were obtained for documentation purposes.   After attaining the desired flow of contrast documented above, a 0.5 to  1.0 ml test dose of 0.25% Marcaine was injected into each respective transforaminal space.  The patient was observed for 90 seconds post injection.  After no sensory deficits were reported, and normal lower extremity motor function was noted,   the above injectate was administered so that equal amounts of the injectate were placed at each foramen (level) into the transforaminal epidural space.   Additional Comments:  The patient tolerated the procedure well Dressing: Band-Aid    Post-procedure details: Patient was observed during the procedure. Post-procedure instructions were reviewed.  Patient left the clinic in stable condition.

## 2018-04-12 ENCOUNTER — Encounter

## 2018-04-12 ENCOUNTER — Encounter: Payer: Self-pay | Admitting: Vascular Surgery

## 2018-04-12 ENCOUNTER — Other Ambulatory Visit: Payer: Self-pay

## 2018-04-12 ENCOUNTER — Ambulatory Visit (INDEPENDENT_AMBULATORY_CARE_PROVIDER_SITE_OTHER): Payer: PPO | Admitting: Vascular Surgery

## 2018-04-12 VITALS — BP 129/64 | HR 71 | Temp 97.4°F | Resp 20 | Ht 64.0 in | Wt 214.0 lb

## 2018-04-12 DIAGNOSIS — I739 Peripheral vascular disease, unspecified: Secondary | ICD-10-CM

## 2018-04-12 NOTE — Progress Notes (Signed)
Patient name: Cheryl Summers MRN: [id number] DOB: 1940/12/20 Sex: female   REASON FOR CONSULT:    Peripheral vascular disease.  The consult is requested by Dr. Bettina Gavia.  HPI:   Cheryl Summers is a pleasant 77 y.o. female, sent for evaluation of peripheral vascular disease.  The patient has had some numbness in her feet for years which has been attributed to neuropathy.  Most recently she is had some back issues and is followed by Dr. Louanne Skye.  Her back pain limits her ability to walk.  For this reason I really do not get any history of claudication.  She denies any history of rest pain or nonhealing ulcers.  Her risk factors for peripheral vascular disease include diabetes, hypertension, hypercholesterolemia, a family history of premature cardiovascular disease, and a history of tobacco use.  She quit in 2012.  She had a myocardial infarction in 2012.  She has undergone previous coronary revascularization.  Vein was taken from the left leg.  Past Medical History:  Diagnosis Date  . Carotid artery stenosis, asymptomatic    50% ASYMPTOMATIC  RIGHT CAROTID STENOSIS  . Chronic diastolic heart failure ([place]) 06/09/2015  . COPD (chronic obstructive pulmonary disease) ([place])   . Gastroesophageal reflux   . Hyperlipidemia   . Hypertension   . Hypertensive heart disease with heart failure ([place]) 06/09/2015  . PAF (paroxysmal atrial fibrillation) ([place]) 06/09/2015   Overview:  CHADS2 vasc score=6  . Tobacco abuse     Family History  Problem Relation Age of Onset  . Heart attack Mother   . Hypertension Mother   . Hyperlipidemia Mother   . Heart attack Father   . Hypertension Father   . Hyperlipidemia Father   . Heart attack Sister   . Stroke Brother   . Heart attack Brother   . Hypertension Daughter   She does have a family history of premature cardiovascular disease.   SOCIAL HISTORY: Social History   Socioeconomic History  . Marital status: Married    Spouse name: Not on file  .  Number of children: Not on file  . Years of education: Not on file  . Highest education level: Not on file  Occupational History  . Not on file  Social Needs  . Financial resource strain: Not on file  . Food insecurity:    Worry: Not on file    Inability: Not on file  . Transportation needs:    Medical: Not on file    Non-medical: Not on file  Tobacco Use  . Smoking status: Former Research scientist (life sciences)  . Smokeless tobacco: Never Used  Substance and Sexual Activity  . Alcohol use: No  . Drug use: No  . Sexual activity: Not on file  Lifestyle  . Physical activity:    Days per week: Not on file    Minutes per session: Not on file  . Stress: Not on file  Relationships  . Social connections:    Talks on phone: Not on file    Gets together: Not on file    Attends religious service: Not on file    Active member of club or organization: Not on file    Attends meetings of clubs or organizations: Not on file    Relationship status: Not on file  . Intimate partner violence:    Fear of current or ex partner: Not on file    Emotionally abused: Not on file    Physically abused: Not on file    Forced  sexual activity: Not on file  Other Topics Concern  . Not on file  Social History Narrative  . Not on file    No Known Allergies  Current Outpatient Medications  Medication Sig Dispense Refill  . albuterol (PROVENTIL HFA;VENTOLIN HFA) 108 (90 Base) MCG/ACT inhaler Inhale 2 puffs into the lungs every 6 (six) hours as needed for wheezing or shortness of breath.    Marland Kitchen amLODipine (NORVASC) 10 MG tablet Take 10 mg by mouth daily.      Marland Kitchen aspirin (GOODSENSE ASPIRIN) 325 MG tablet Take 325 mg by mouth daily.    Marland Kitchen atorvastatin (LIPITOR) 20 MG tablet Take 20 mg by mouth daily.    . budesonide-formoterol (SYMBICORT) 160-4.5 MCG/ACT inhaler Inhale 2 puffs into the lungs 2 (two) times daily.    Marland Kitchen buPROPion (WELLBUTRIN XL) 150 MG 24 hr tablet Take 150 mg by mouth daily.    . dapagliflozin propanediol (FARXIGA)  10 MG TABS tablet Take 10 mg by mouth daily.    Marland Kitchen ezetimibe (ZETIA) 10 MG tablet Take 5 mg by mouth daily.     . fluticasone furoate-vilanterol (BREO ELLIPTA) 200-25 MCG/INH AEPB Inhale 1 puff into the lungs daily.    . Memantine HCl-Donepezil HCl (NAMZARIC) 28-10 MG CP24 Take 1 capsule by mouth daily.    . montelukast (SINGULAIR) 10 MG tablet Take 10 mg by mouth daily.    . Multiple Vitamin (MULTIVITAMIN) tablet Take 1 tablet by mouth daily.    Marland Kitchen omeprazole (PRILOSEC) 20 MG capsule Take 20 mg by mouth daily. prn    . pregabalin (LYRICA) 100 MG capsule Take 100 mg by mouth daily.    . quinapril (ACCUPRIL) 40 MG tablet Take 40 mg by mouth daily.      . sertraline (ZOLOFT) 50 MG tablet Take 50 mg by mouth daily.      . Sulfamethoxazole-Trimethoprim (BACTRIM DS PO) Take 1 tablet by mouth daily.    Marland Kitchen telmisartan (MICARDIS) 40 MG tablet Take 1 tablet (40 mg total) by mouth daily. 30 tablet 3  . tiZANidine (ZANAFLEX) 2 MG tablet Take 2 mg by mouth once. Once evrey 12 hr.    . torsemide (DEMADEX) 20 MG tablet Take 1 tablet (20 mg total) by mouth daily. 90 tablet 3  . traMADol (ULTRAM) 50 MG tablet TAKE 1 TABLET BY MOUTH EVERY 6 HOURS AS NEEDED FOR MODERATE PAIN 40 tablet 0   Current Facility-Administered Medications  Medication Dose Route Frequency Provider Last Rate Last Dose  . betamethasone acetate-betamethasone sodium phosphate (CELESTONE) injection 12 mg  12 mg Other Once Magnus Sinning, MD        REVIEW OF SYSTEMS:  [X]  denotes positive finding, [ ]  denotes negative finding Cardiac  Comments:  Chest pain or chest pressure:    Shortness of breath upon exertion: x   Short of breath when lying flat:    Irregular heart rhythm:        Vascular    Pain in calf, thigh, or hip brought on by ambulation: x   Pain in feet at night that wakes you up from your sleep:  x   Blood clot in your veins:    Leg swelling:  x       Pulmonary    Oxygen at home:    Productive cough:     Wheezing:           Neurologic    Sudden weakness in arms or legs:     Sudden numbness in arms or  legs:     Sudden onset of difficulty speaking or slurred speech:    Temporary loss of vision in one eye:     Problems with dizziness:         Gastrointestinal    Blood in stool:     Vomited blood:         Genitourinary    Burning when urinating:     Blood in urine:        Psychiatric    Major depression:         Hematologic    Bleeding problems:    Problems with blood clotting too easily:        Skin    Rashes or ulcers:        Constitutional    Fever or chills:     PHYSICAL EXAM:   Vitals:   04/12/18 1100  BP: 129/64  Pulse: 71  Resp: 20  Temp: (!) 97.4 F (36.3 C)  TempSrc: Oral  SpO2: 95%  Weight: 214 lb (97.1 kg)  Height: 5\' 4"  (1.626 m)    GENERAL: The patient is a well-nourished female, in no acute distress. The vital signs are documented above. CARDIAC: There is a regular rate and rhythm.  VASCULAR: She has soft bilateral carotid bruits. On the left side, I cannot palpate a femoral pulse.  I cannot palpate popliteal or pedal pulses. On the right side, she has a palpable femoral pulse and a diminished but palpable right dorsalis pedis pulse. She has mild bilateral lower extremity swelling. PULMONARY: There is good air exchange bilaterally without wheezing or rales. ABDOMEN: Soft and non-tender with normal pitched bowel sounds.  MUSCULOSKELETAL: There are no major deformities or cyanosis. NEUROLOGIC: No focal weakness or paresthesias are detected. SKIN: There are no ulcers or rashes noted. PSYCHIATRIC: The patient has a normal affect.  DATA:    ARTERIAL DOPPLER STUDY: I reviewed her arterial Doppler study that was done on 03/03/2018.  This shows triphasic Doppler signals in the right foot with an ABI of 81%.  Toe pressure on the right is 59 mmHg.  On the left side there are monophasic Doppler signals in the dorsalis pedis and posterior tibial positions.  ABI is 33%.  Toe  pressure on the left was noted to be 74 mmHg.  ARTERIAL DUPLEX: I have reviewed her arterial duplex scan that was done on 03/03/2018.  This shows a biphasic common femoral artery waveform on the right.  On the left side she has a monophasic common femoral artery waveform and monophasic signals below that.  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  Given that she is asymptomatic, at this point I would not recommend arteriography unless she developed rest pain or nonhealing ulcer.  I have discussed with her the importance of being sure that she does not develop any wounds on her left foot as this could quickly become a limb threatening problem.  I encouraged her to make sure that her shoes fit correctly and to inspect her feet daily.  I have recommended a follow-up Doppler study in 1 year and I will see her back at that time.  She knows to call sooner if she has problems.  I have encouraged her to stay as active as possible.  We also discussed the importance of nutrition.  Fortunately  she has quit smoking.  I do not see her vascular disease is a contraindication to back surgery if she is being considered for this.  BILATERAL CAROTID BRUITS: The patient has soft bilateral carotid bruits.  She is asymptomatic.  She is on aspirin and is on a statin.  Patient tells me that she is previously had carotid duplex scans done by Dr. Joya Gaskins office.  It would be easier for her to have these tests done there so I have not ordered a carotid duplex scan today.  She is scheduled to follow-up with Dr. Bettina Gavia next week.  Deitra Mayo [facility] [phone]

## 2018-04-19 ENCOUNTER — Ambulatory Visit (INDEPENDENT_AMBULATORY_CARE_PROVIDER_SITE_OTHER): Payer: Self-pay | Admitting: Specialist

## 2018-04-24 ENCOUNTER — Ambulatory Visit (INDEPENDENT_AMBULATORY_CARE_PROVIDER_SITE_OTHER): Payer: PPO | Admitting: Specialist

## 2018-04-24 ENCOUNTER — Encounter (INDEPENDENT_AMBULATORY_CARE_PROVIDER_SITE_OTHER): Payer: Self-pay | Admitting: Specialist

## 2018-04-24 ENCOUNTER — Encounter: Payer: PPO | Admitting: Surgery

## 2018-04-24 ENCOUNTER — Ambulatory Visit (INDEPENDENT_AMBULATORY_CARE_PROVIDER_SITE_OTHER): Payer: Self-pay

## 2018-04-24 VITALS — BP 150/74 | HR 74 | Ht 64.0 in | Wt 213.0 lb

## 2018-04-24 DIAGNOSIS — M25551 Pain in right hip: Secondary | ICD-10-CM

## 2018-04-24 DIAGNOSIS — M4316 Spondylolisthesis, lumbar region: Secondary | ICD-10-CM

## 2018-04-24 DIAGNOSIS — M48062 Spinal stenosis, lumbar region with neurogenic claudication: Secondary | ICD-10-CM

## 2018-04-24 MED ORDER — HYDROCODONE-ACETAMINOPHEN 5-325 MG PO TABS: 1.0000 | ORAL_TABLET | Freq: Four times a day (QID) | ORAL | 0 refills | Status: DC | PRN

## 2018-04-24 NOTE — Progress Notes (Addendum)
Office Visit Note   Patient: Cheryl Summers           Date of Birth: 08/03/1941           MRN: [id number] Visit Date: 04/24/2018              Requested by: Nicholos Johns, MD [address] [place], [place] [address] PCP: Nicholos Johns, MD   Assessment & Plan: Visit Diagnoses:  1. Pain of right hip joint   2. Spinal stenosis of lumbar region with neurogenic claudication   3. Spondylolisthesis, lumbar region     Plan: Avoid bending, stooping and avoid lifting weights greater than 10 lbs. Avoid prolong standing and walking. Order for a new walker with wheels. Surgery scheduling secretary Kandice Hams, will call you in the next week to schedule for surgery.  Surgery recommended is a four level lumbar decompression L2-3, L3-4, L4-5 and L5-S1 with fusion L4-5 this fusion would be done with rods, screws and cages with local bone graft and allograft (donor bone graft). Take hydrocodone for for pain.The laminectomies at L2-3, L3-4 and L4-5 would be central, the L5-S1 would be bilateral partial hemilaminectomies.  Risk of surgery includes risk of infection 1 in 100 patients, bleeding 4% chance you would need a transfusion.   Risk to the nerves is one in 10,000. You will need to use a brace for 3 months and wean from the brace on the 4th month. Expect improved walking and standing tolerance. Expect relief of leg pain but numbness may persist depending on the length and degree of pressure that has been present.   Follow-Up Instructions: Return in about 1 month (around 05/22/2018).   Orders:  Orders Placed This Encounter  Procedures  . XR HIP UNILAT W OR W/O PELVIS 2-3 VIEWS RIGHT   No orders of the defined types were placed in this encounter.     Procedures: No procedures performed   Clinical Data: No additional findings.   Subjective: Chief Complaint  Patient presents with  . Lower Back - Pain  . Right Hip - Pain    77 year old female with history of lumbar spinal  stenosis. Difficulty with standing or walking. She is unable to stand in the backyard and make it back into the house. She is not experiencing pain with sitting but immediately with standing up and walking. Using a wheel chair for longer distance. History of CHF and CAD. History of carotic artery stenosis. Underwent arterial studies by Dr. Doren Custard showing left leg ASPVD greater than right. Most of her pain is in the right buttocka and right leg. Attempt at  transforamenal ESI by Dr. Ernestina Patches was not successful in relieving her pain for more than a few hours. As soon as she began getting up she began hurting again.Currently on Lyrica, wellbutrin XL and Zoloft. We have given low dose of tramadol q 12 hours but she is not getting any relief.    Review of Systems  Constitutional: Negative.   HENT: Negative.   Eyes: Negative.   Respiratory: Negative.   Cardiovascular: Negative.   Gastrointestinal: Negative.   Endocrine: Negative.   Genitourinary: Negative.   Musculoskeletal: Negative.   Skin: Negative.   Allergic/Immunologic: Negative.   Neurological: Negative.   Hematological: Negative.   Psychiatric/Behavioral: Negative.      Objective: Vital Signs: BP (!) 150/74 (BP Location: Left Arm, Patient Position: Sitting)   Pulse 74   Ht 5\' 4"  (1.626 m)   Wt 213 lb (  96.6 kg)   BMI 36.56 kg/m   Physical Exam  Constitutional: She is oriented to person, place, and time. She appears well-developed and well-nourished.  HENT:  Head: Normocephalic and atraumatic.  Eyes: Pupils are equal, round, and reactive to light. EOM are normal.  Neck: Normal range of motion. Neck supple.  Pulmonary/Chest: Effort normal and breath sounds normal.  Abdominal: Soft. Bowel sounds are normal.  Neurological: She is alert and oriented to person, place, and time.  Skin: Skin is warm and dry.  Psychiatric: She has a normal mood and affect. Her behavior is normal. Judgment and thought content normal.    Back Exam    Tenderness  The patient is experiencing tenderness in the lumbar.  Range of Motion  Extension: abnormal  Flexion: normal  Lateral bend right: abnormal  Lateral bend left: abnormal  Rotation right: abnormal  Rotation left: abnormal   Muscle Strength  Right Quadriceps:  5/5  Left Quadriceps:  5/5  Right Hamstrings:  5/5  Left Hamstrings:  5/5   Tests  Straight leg raise right: negative Straight leg raise left: negative  Reflexes  Patellar: normal Achilles: normal Biceps: normal Babinski's sign: normal   Other  Toe walk: abnormal Heel walk: abnormal Sensation: normal Gait: normal  Erythema: no back redness Scars: absent  Comments:  Motor in sitting position is normal.       Specialty Comments:  No specialty comments available.  Imaging: No results found.   PMFS History: Patient Active Problem List   Diagnosis Date Noted  . Murmur, heart 02/24/2018  . Bradycardia 01/19/2018  . Coronary artery disease involving native coronary artery of native heart with angina pectoris ([place]) 05/29/2017  . Tobacco abuse 05/13/2017  . Hypertension 05/13/2017  . Hyperlipidemia 05/13/2017  . GERD (gastroesophageal reflux disease) 05/13/2017  . COPD (chronic obstructive pulmonary disease) ([place]) 05/13/2017  . Carotid artery stenosis 05/13/2017  . Chronic diastolic heart failure ([place]) 06/09/2015  . Hypertensive heart disease with heart failure ([place]) 06/09/2015  . PAF (paroxysmal atrial fibrillation) ([place]) 06/09/2015   Past Medical History:  Diagnosis Date  . Carotid artery stenosis, asymptomatic    50% ASYMPTOMATIC  RIGHT CAROTID STENOSIS  . Chronic diastolic heart failure ([place]) 06/09/2015  . COPD (chronic obstructive pulmonary disease) ([place])   . Gastroesophageal reflux   . Hyperlipidemia   . Hypertension   . Hypertensive heart disease with heart failure ([place]) 06/09/2015  . PAF (paroxysmal atrial fibrillation) ([place]) 06/09/2015   Overview:  CHADS2 vasc score=6  .  Tobacco abuse     Family History  Problem Relation Age of Onset  . Heart attack Mother   . Hypertension Mother   . Hyperlipidemia Mother   . Heart attack Father   . Hypertension Father   . Hyperlipidemia Father   . Heart attack Sister   . Stroke Brother   . Heart attack Brother   . Hypertension Daughter     Past Surgical History:  Procedure Laterality Date  . ABDOMINAL HYSTERECTOMY    . CORONARY ARTERY BYPASS GRAFT  2012  . NOSE SURGERY     Social History   Occupational History  . Not on file  Tobacco Use  . Smoking status: Former Research scientist (life sciences)  . Smokeless tobacco: Never Used  Substance and Sexual Activity  . Alcohol use: No  . Drug use: No  . Sexual activity: Not on file

## 2018-04-24 NOTE — Patient Instructions (Addendum)
Avoid bending, stooping and avoid lifting weights greater than 10 lbs. Avoid prolong standing and walking. Order for a new walker with wheels. Surgery scheduling secretary Kandice Hams, will call you in the next week to schedule for surgery.  Surgery recommended is a four level lumbar decompression L2-3, L3-4, L4-5 and L5-S1 with fusion L4-5 this fusion would be done with rods, screws and cages with local bone graft and allograft (donor bone graft). Take hydrocodone for for pain.The laminectomies at L2-3, L3-4 and L4-5 would be central, the L5-S1 would be bilateral partial hemilaminectomies.  Risk of surgery includes risk of infection 1 in 100 patients, bleeding 4% chance you would need a transfusion.   Risk to the nerves is one in 10,000. You will need to use a brace for 3 months and wean from the brace on the 4th month. Expect improved walking and standing tolerance. Expect relief of leg pain but numbness may persist depending on the length and degree of pressure that has been present.

## 2018-04-27 DIAGNOSIS — J449 Chronic obstructive pulmonary disease, unspecified: Secondary | ICD-10-CM | POA: Diagnosis not present

## 2018-04-28 ENCOUNTER — Telehealth (INDEPENDENT_AMBULATORY_CARE_PROVIDER_SITE_OTHER): Payer: Self-pay | Admitting: Specialist

## 2018-04-28 NOTE — Telephone Encounter (Signed)
Patients daughter called and would like to know if patient can take Hydrocodone 3 times a day because it wears off after 8 hours. CB # [phone]

## 2018-05-01 NOTE — Telephone Encounter (Signed)
Patients daughter called and would like to know if patient can take Hydrocodone 3 times a day because it wears off after 8 hours.

## 2018-05-01 NOTE — Progress Notes (Signed)
Cardiology Office Note:    Date:  05/02/2018   ID:  Cheryl Summers, DOB January 08, 1941, MRN [id number]  PCP:  Cheryl Johns, MD  Cardiologist:  Cheryl More, MD    Referring MD: Cheryl Johns, MD    ASSESSMENT:    1. Preop cardiovascular exam   2. PAF (paroxysmal atrial fibrillation) ([place])   3. Chronic diastolic heart failure ([place])   4. Hypertensive heart disease with heart failure ([place])   5. Coronary artery disease involving native coronary artery of native heart with angina pectoris ([place])   6. Bradycardia   7. Bilateral carotid artery stenosis    PLAN:     Preoperative cardiovascular evaluation summary  Surgeon: Dr Cheryl Summers   Procedure: Surgery recommended is a four level lumbar decompression L2-3, L3-4, L4-5 and L5-S1 with fusion L4-5 this fusion would be   done  with rods, screws and cages with local bone graft and allograft (donor bone graft).   The surgery is elective Active cardiac problems  CAD, CABG 2012,diastolic heart failure and paroxysmal atrial fibrillation.  The cardiac status is stable. The planned procedure is intermediate risk. The functional capacity is 4 mets or greater no she is limited by pain Recent cardiac tests performed cardiac echo May 2019 Given the above his overall risk for the planned procedure is low acceptable Antiplatelet/ anticoagulant recommendation: Discontinue aspirin 1 week prior to surgery please tell her when she can restart Other cardiac medication or device recommendation: None Anesthesia recommendation: None Observation, monitoring,and postoperative test recommendation: Postoperatively please place her in a monitored bed with there are a history of atrial fibrillation check an EKG postoperative day 1 and if any concerns contact heart care The patient is optimized from a cardiology perspective: Yes  In order of problems listed above:  1. Please review above she is optimized from a cardiac perspective for planned back surgery 2. Stable  maintaining sinus rhythm on today's EKG 3. Stable compensated continue current diuretic 4. Blood pressure control is acceptable continue treatment including calcium channel blocker 5. Stable since bypass surgery 2012 I do not think she requires a preoperative ischemia evaluation 6. Improved off of beta-blocker 7. As recommended by vascular surgery will do a carotid duplex in my office in the next week.  I do not think that her back surgery needs to be delayed  Next appointment: 6 months  Medication Adjustments/Labs and Tests Ordered: Current medicines are reviewed at length with the patient today.  Concerns regarding medicines are outlined above.  Orders Placed This Encounter  Procedures  . EKG 12-Lead   No orders of the defined types were placed in this encounter.   Chief Complaint  Patient presents with  . Follow-up    History of Present Illness:    Cheryl Summers is a 77 y.o. female with a hx of CABG in 2012,, CHF, Atrial Fibrillation-CHADS2 vasc score=5, bradycardia, hypertension, and hyperlipidemia.She is not anticoagulated due to gait dysfunction. She was last seen 02/24/18  Surgery recommended by Dr Cheryl Summers a four level lumbar decompression L2-3, L3-4, L4-5 and L5-S1 with fusion L4-5 this fusion would be done with rods, screws and cages with local bone graft and allograft (donor bone graft).  She has multilevel arterial occlusive disease left lower extremity ABI of 0.33 has been seen by vascular surgery and at this time not recommended for any intervention.  She also has bilateral carotid bruit, I have no record of the carotid duplex being performed in my records in epic  Compliance with  diet, lifestyle and medications: Yes  She is committed to having her surgery and improving function and reducing pain.  She is limited in her activities by her back pain but still is capable of ADLs and ambulating in and out of the car and into our office.  She has had no chest pain dyspnea  palpitations syncope or TIA  Past Medical History:  Diagnosis Date  . Carotid artery stenosis, asymptomatic    50% ASYMPTOMATIC  RIGHT CAROTID STENOSIS  . Chronic diastolic heart failure ([place]) 06/09/2015  . COPD (chronic obstructive pulmonary disease) ([place])   . Gastroesophageal reflux   . Hyperlipidemia   . Hypertension   . Hypertensive heart disease with heart failure ([place]) 06/09/2015  . PAF (paroxysmal atrial fibrillation) ([place]) 06/09/2015   Overview:  CHADS2 vasc score=6  . Tobacco abuse     Past Surgical History:  Procedure Laterality Date  . ABDOMINAL HYSTERECTOMY    . CORONARY ARTERY BYPASS GRAFT  2012  . NOSE SURGERY      Current Medications: Current Meds  Medication Sig  . albuterol (PROVENTIL HFA;VENTOLIN HFA) 108 (90 Base) MCG/ACT inhaler Inhale 2 puffs into the lungs every 6 (six) hours as needed for wheezing or shortness of breath.  Marland Kitchen amLODipine (NORVASC) 10 MG tablet Take 10 mg by mouth daily.    Marland Kitchen aspirin (GOODSENSE ASPIRIN) 325 MG tablet Take 325 mg by mouth daily.  Marland Kitchen atorvastatin (LIPITOR) 20 MG tablet Take 20 mg by mouth daily.  . budesonide-formoterol (SYMBICORT) 160-4.5 MCG/ACT inhaler Inhale 2 puffs into the lungs 2 (two) times daily.  Marland Kitchen buPROPion (WELLBUTRIN XL) 150 MG 24 hr tablet Take 150 mg by mouth daily.  . dapagliflozin propanediol (FARXIGA) 10 MG TABS tablet Take 10 mg by mouth daily.  Marland Kitchen ezetimibe (ZETIA) 10 MG tablet Take 5 mg by mouth daily.   . fluticasone furoate-vilanterol (BREO ELLIPTA) 200-25 MCG/INH AEPB Inhale 1 puff into the lungs daily.  Marland Kitchen HYDROcodone-acetaminophen (NORCO/VICODIN) 5-325 MG tablet Take 1-2 tablets by mouth every 6 (six) hours as needed for moderate pain. (Patient taking differently: Take 1 tablet by mouth 2 (two) times daily. )  . Memantine HCl-Donepezil HCl (NAMZARIC) 28-10 MG CP24 Take 1 capsule by mouth daily.  . montelukast (SINGULAIR) 10 MG tablet Take 10 mg by mouth daily.  . Multiple Vitamin (MULTIVITAMIN) tablet Take 1  tablet by mouth daily.  Marland Kitchen omeprazole (PRILOSEC) 20 MG capsule Take 20 mg by mouth daily. prn  . pregabalin (LYRICA) 100 MG capsule Take 100 mg by mouth 2 (two) times daily.   . sertraline (ZOLOFT) 50 MG tablet Take 50 mg by mouth daily.    Marland Kitchen telmisartan (MICARDIS) 40 MG tablet Take 1 tablet (40 mg total) by mouth daily.  Marland Kitchen tiZANidine (ZANAFLEX) 2 MG tablet Take 2 mg by mouth once. Once evrey 12 hr.  . torsemide (DEMADEX) 20 MG tablet Take 1 tablet (20 mg total) by mouth daily.   Current Facility-Administered Medications for the 05/02/18 encounter (Office Visit) with Richardo Priest, MD  Medication  . betamethasone acetate-betamethasone sodium phosphate (CELESTONE) injection 12 mg     Allergies:   Patient has no known allergies.   Social History   Socioeconomic History  . Marital status: Married    Spouse name: Not on file  . Number of children: Not on file  . Years of education: Not on file  . Highest education level: Not on file  Occupational History  . Not on file  Social Needs  .  Financial resource strain: Not on file  . Food insecurity:    Worry: Not on file    Inability: Not on file  . Transportation needs:    Medical: Not on file    Non-medical: Not on file  Tobacco Use  . Smoking status: Former Research scientist (life sciences)  . Smokeless tobacco: Never Used  Substance and Sexual Activity  . Alcohol use: No  . Drug use: No  . Sexual activity: Not on file  Lifestyle  . Physical activity:    Days per week: Not on file    Minutes per session: Not on file  . Stress: Not on file  Relationships  . Social connections:    Talks on phone: Not on file    Gets together: Not on file    Attends religious service: Not on file    Active member of club or organization: Not on file    Attends meetings of clubs or organizations: Not on file    Relationship status: Not on file  Other Topics Concern  . Not on file  Social History Narrative  . Not on file     Family History: The patient's family  history includes Heart attack in her brother, father, mother, and sister; Hyperlipidemia in her father and mother; Hypertension in her daughter, father, and mother; Stroke in her brother. ROS:   Please see the history of present illness.    All other systems reviewed and are negative.  EKGs/Labs/Other Studies Reviewed:    The following studies were reviewed today:  EKG:  EKG ordered today.  The ekg ordered today demonstrates sinus rhythm lead reversal was recognized on PVC Echocardiogram 03/03/2018 EF 60 to 65% normal left ventricular diastolic pressure trivial mitral regurgitation Recent Labs:   01/18/2018 CMP was normal except for creatinine 1.76 GFR 27 cc/min CBC was normal No results found for requested labs within last 8760 hours.  Recent Lipid Panel No results found for: CHOL, TRIG, HDL, CHOLHDL, VLDL, LDLCALC, LDLDIRECT  Physical Exam:    VS:  BP (!) 154/64 (BP Location: Right Arm, Patient Position: Sitting, Cuff Size: Large)   Pulse 71   Ht 5\' 4"  (1.626 m)   Wt 215 lb 12.8 oz (97.9 kg)   SpO2 98%   BMI 37.04 kg/m     Wt Readings from Last 3 Encounters:  05/02/18 215 lb 12.8 oz (97.9 kg)  04/24/18 213 lb (96.6 kg)  04/12/18 214 lb (97.1 kg)     GEN:  Well nourished, well developed in no acute distress HEENT: Normal NECK: No JVD; soft bilateral carotid bruits LYMPHATICS: No lymphadenopathy CARDIAC:  RRR, no murmurs, rubs, gallops RESPIRATORY:  Clear to auscultation without rales, wheezing or rhonchi  ABDOMEN: Soft, non-tender, non-distended MUSCULOSKELETAL:  No edema; No deformity  SKIN: Warm and dry NEUROLOGIC:  Alert and oriented x 3 PSYCHIATRIC:  Normal affect    Signed, Cheryl More, MD  05/02/2018 4:53 PM    [facility]

## 2018-05-01 NOTE — Telephone Encounter (Signed)
I called and her of this

## 2018-05-01 NOTE — Telephone Encounter (Signed)
She call go to 3 times per day, but need for more narcotics can also relate to developing a tolerance. I am okay with x per day. jen

## 2018-05-02 ENCOUNTER — Ambulatory Visit (INDEPENDENT_AMBULATORY_CARE_PROVIDER_SITE_OTHER): Payer: PPO | Admitting: Cardiology

## 2018-05-02 ENCOUNTER — Encounter: Payer: Self-pay | Admitting: Cardiology

## 2018-05-02 VITALS — BP 154/64 | HR 71 | Ht 64.0 in | Wt 215.8 lb

## 2018-05-02 DIAGNOSIS — I6523 Occlusion and stenosis of bilateral carotid arteries: Secondary | ICD-10-CM | POA: Diagnosis not present

## 2018-05-02 DIAGNOSIS — I110 Hypertensive heart disease with heart failure: Secondary | ICD-10-CM | POA: Diagnosis not present

## 2018-05-02 DIAGNOSIS — I25119 Atherosclerotic heart disease of native coronary artery with unspecified angina pectoris: Secondary | ICD-10-CM

## 2018-05-02 DIAGNOSIS — Z01810 Encounter for preprocedural cardiovascular examination: Secondary | ICD-10-CM | POA: Diagnosis not present

## 2018-05-02 DIAGNOSIS — I5032 Chronic diastolic (congestive) heart failure: Secondary | ICD-10-CM

## 2018-05-02 DIAGNOSIS — R001 Bradycardia, unspecified: Secondary | ICD-10-CM

## 2018-05-02 DIAGNOSIS — I480 Paroxysmal atrial fibrillation: Secondary | ICD-10-CM | POA: Diagnosis not present

## 2018-05-02 NOTE — Patient Instructions (Signed)
Medication Instructions:  Your physician recommends that you continue on your current medications as directed. Please refer to the Current Medication list given to you today.   Labwork: NONE  Testing/Procedures: You had an EKG today  Your physician has requested that you have a carotid duplex. This test is an ultrasound of the carotid arteries in your neck. It looks at blood flow through these arteries that supply the brain with blood. Allow one hour for this exam. There are no restrictions or special instructions.    Follow-Up: Your physician wants you to follow-up in: 6 months. You will receive a reminder letter in the mail two months in advance. If you don't receive a letter, please call our office to schedule the follow-up appointment.   Any Other Special Instructions Will Be Listed Below (If Applicable).     If you need a refill on your cardiac medications before your next appointment, please call your pharmacy.

## 2018-05-05 ENCOUNTER — Ambulatory Visit (HOSPITAL_COMMUNITY)
Admission: RE | Admit: 2018-05-05 | Discharge: 2018-05-05 | Disposition: A | Discharge status: Home / Self Care | Payer: PPO | Source: Ambulatory Visit | Attending: Cardiology | Admitting: Cardiology

## 2018-05-05 DIAGNOSIS — I6523 Occlusion and stenosis of bilateral carotid arteries: Secondary | ICD-10-CM | POA: Diagnosis not present

## 2018-05-05 DIAGNOSIS — I5032 Chronic diastolic (congestive) heart failure: Secondary | ICD-10-CM | POA: Insufficient documentation

## 2018-05-05 DIAGNOSIS — Z01810 Encounter for preprocedural cardiovascular examination: Secondary | ICD-10-CM | POA: Insufficient documentation

## 2018-05-05 DIAGNOSIS — I480 Paroxysmal atrial fibrillation: Secondary | ICD-10-CM | POA: Diagnosis not present

## 2018-05-05 NOTE — Progress Notes (Signed)
Bilateral carotid duplex completed. 40% to 59% ICA stenosis probable upper end of scale. [facility], [place] 05/05/2018, 3:55 pm

## 2018-05-08 DIAGNOSIS — Z6837 Body mass index (BMI) 37.0-37.9, adult: Secondary | ICD-10-CM | POA: Diagnosis not present

## 2018-05-08 DIAGNOSIS — I10 Essential (primary) hypertension: Secondary | ICD-10-CM | POA: Diagnosis not present

## 2018-05-08 DIAGNOSIS — Z01818 Encounter for other preprocedural examination: Secondary | ICD-10-CM | POA: Diagnosis not present

## 2018-05-08 DIAGNOSIS — E1140 Type 2 diabetes mellitus with diabetic neuropathy, unspecified: Secondary | ICD-10-CM | POA: Diagnosis not present

## 2018-05-08 DIAGNOSIS — M545 Low back pain: Secondary | ICD-10-CM | POA: Diagnosis not present

## 2018-05-12 ENCOUNTER — Telehealth (INDEPENDENT_AMBULATORY_CARE_PROVIDER_SITE_OTHER): Payer: Self-pay | Admitting: Specialist

## 2018-05-12 NOTE — Telephone Encounter (Signed)
Pt daughter called stated mother needed a refill for Hydrocodone.  Please call daughter [name] [phone]

## 2018-05-12 NOTE — Telephone Encounter (Signed)
Rx request 

## 2018-05-15 ENCOUNTER — Other Ambulatory Visit (INDEPENDENT_AMBULATORY_CARE_PROVIDER_SITE_OTHER): Payer: Self-pay | Admitting: Specialist

## 2018-05-15 MED ORDER — HYDROCODONE-ACETAMINOPHEN 5-325 MG PO TABS: 1.0000 | ORAL_TABLET | Freq: Two times a day (BID) | ORAL | 0 refills | Status: DC

## 2018-05-15 NOTE — Telephone Encounter (Signed)
Sent request to Dr. Nitka 

## 2018-05-15 NOTE — Telephone Encounter (Signed)
Daughter is aware her rx is ready for pick up

## 2018-05-15 NOTE — Telephone Encounter (Signed)
Patient's daughter [name]) called advised her mother took her last pain medicine this morning. [name] said she will need to pick up Rx today (Hydrocodone)  [name] asked if her mother is a candidate for pain management rather than having surgery. The number to contact [name] is [phone]

## 2018-05-15 NOTE — Telephone Encounter (Signed)
Cheryl Summers called to check again on prescription, she said she needed to know something by 4 since she lives in [place] and will have to leave work to come get prescription. Please call as soon as possible # [phone]

## 2018-05-17 ENCOUNTER — Telehealth (INDEPENDENT_AMBULATORY_CARE_PROVIDER_SITE_OTHER): Payer: Self-pay | Admitting: Specialist

## 2018-05-17 NOTE — Telephone Encounter (Signed)
Patient's daughter [name]) called asking if pain management would be an option for her mother instead of surgery?  The number to contact [name] is [phone]

## 2018-05-17 NOTE — Telephone Encounter (Signed)
Patient's daughter [name]) called asking if pain management would be an option for her mother instead of surgery?

## 2018-05-23 ENCOUNTER — Telehealth: Payer: Self-pay | Admitting: Cardiology

## 2018-05-23 NOTE — Telephone Encounter (Signed)
Spoke with daughter regarding preoperative clearance question. Patient has been cleared per Dr. Bettina Gavia. Staff message sent asking if anything else was need for this clearance to [facility] with Dr. Louanne Skye. Will contact patient by end of the day with updates.

## 2018-05-23 NOTE — Telephone Encounter (Signed)
Please call daughter about patient having surgery. No responses from surgical people.

## 2018-05-23 NOTE — Telephone Encounter (Signed)
Spoke with patient's daughter. Surgery scheduled. Issues resolved.

## 2018-05-31 NOTE — Progress Notes (Signed)
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   History includes former smoker, COPD, CAD (s/p CABG '12, details unknown), chronic diastolic CHF, PAF (not anticoagulated due to gait dysfunction), DM2, carotid artery stenosis (40-59% BICA 04/2018), GERD, HTN, HLD, tremors. Labs from PCP and PAT show renal insufficiency (Cr ~ 1.60-1.70). BMI is consistent with obesity.   Patient has medical and cardiac clearance for surgery. Overall, I think her renal function is stable (Cr 1.77) but will need on-going monitoring. Based on currently available information, I would anticipate that she can proceed as planned if no acute changes.   VS: BP (!) 116/54   Pulse 73   Temp 36.6 C   Resp 20   Ht 5' 4" (1.626 m)   Wt 98.6 kg   SpO2 96%   BMI 37.32 kg/m   PROVIDERS: Nicholos Johns, MD is PCP ([facility]) who signed a medical clearance for surgery. Following 05/09/18 labs, Dr. Rica Records noted that patient had an elevated creatinine. She advised patient to avoid NSAIDS and OTC medications unless recommended by medical provider. She discussed periodic monitoring of renal function and symptoms to watch out for. If progressive increase then would consider nephrology referral.    Shirlee More, MD is cardiologist. Last visit 05/02/18 for preoperative evaluation. According to his note, overall risk was considered "low acceptable". Permission to hold ASA 1 week prior to surgery given. Post-operatively, he recommended place her  on a monitored bed and EKG of POD #1 and contact [facility] if needed.     LABS: Preoperative labs noted. BUN 24, Cr 1.77, eGFR (non AA) 26. Comparison labs from 05/09/18 from PCP show a Creatinine of 1.61, BUN 25, eGFR 30, A1c 5.4, TSH 1.63, H/H 12.2/37.9. Dr. Rica Records is currently monitoring patient's renal function.  (all labs ordered are listed, but only abnormal results are displayed)  Labs Reviewed  GLUCOSE, CAPILLARY - Abnormal; Notable for the following components:      Result Value   Glucose-Capillary 139 (*)    All other components within normal limits  CBC - Abnormal; Notable for the following components:   MCHC 29.9 (*)    All other components within normal limits  COMPREHENSIVE METABOLIC PANEL - Abnormal; Notable for the following components:   Sodium 147 (*)    Glucose, Bld 162 (*)    BUN 24 (*)    Creatinine, Ser 1.77 (*)    GFR calc non Af Amer 26 (*)    GFR calc Af Amer 30 (*)    All other components within normal limits  URINALYSIS, ROUTINE W REFLEX MICROSCOPIC - Abnormal; Notable for the following components:   Color, Urine STRAW (*)    Specific Gravity, Urine 1.003 (*)    Glucose, UA >=500 (*)    All other components within normal limits  SURGICAL PCR SCREEN  SURGICAL PCR SCREEN  APTT  PROTIME-INR  TYPE AND SCREEN  ABO/RH    EKG: 01/19/18: SB at 56 bpm. Cannot  rule out anterior infarct (age undetermined). There is also a tracing from 05/02/18 that showed SR with occasional PCV, but has suspected lead reversal.     CV: Carotid U/S 05/05/18: Final Interpretation: Right Carotid: Velocities in the right ICA are consistent with a 40-59% stenosis. Left Carotid: Velocities in the left ICA are consistent with a 40-59% stenosis. Vertebrals: Bilateral vertebral arteries demonstrate antegrade flow. Subclavians: Normal flow hemodynamics were seen in bilateral subclavian arteries.  Echo 03/03/18: Study Conclusions - Left ventricle: The cavity size was normal. Wall  thickness was   normal. Systolic function was normal. The estimated ejection   fraction was in the range of 60% to 65%. Wall motion was normal;   there were no regional wall motion abnormalities. Doppler   parameters are consistent with abnormal left ventricular   relaxation (grade 1 diastolic dysfunction). The E/e&' ratio is <8,   suggesting normal LV filling pressure. - Mitral valve: Mildly thickened leaflets . There was trivial   regurgitation. - Left atrium: The atrium was normal in size. - Inferior vena cava: The vessel was normal in size. The   respirophasic diameter changes were in the normal range (>= 50%),   consistent with normal central venous pressure. Impressions: - LVEF 60-65%, normal wall thickness, normal wall motion, grade 1   DD, normal LV filling pressure, trivial MR, normal LA size,   normal IVC.  Cardiac event monitor 01/18/18-02/16/18: Baseline transmission: Sinus rhythm interventricular conduction delay                                          Minimum rate 55 average rate 70 maximum rate 121 bpm Atrial arrhythmia:                     None no episodes of atrial fibrillation,  Ventricular arrhythmia:          None Conduction abnormality:        None Bradycardia:                           None Symptoms:                             10 events 1 symptomatic with lightheadedness none associated with arrhythmia Conclusion:                            Unremarkable event monitor   Past Medical History:  Diagnosis Date  . Anxiety   . Carotid artery stenosis, asymptomatic    50% ASYMPTOMATIC  RIGHT CAROTID STENOSIS  . Chronic diastolic heart failure ([place]) 06/09/2015  . COPD (chronic obstructive pulmonary disease) ([place])   . Depression   . Diabetes mellitus without complication ([place])   . Dysrhythmia   . Gastroesophageal reflux   . Hyperlipidemia   . Hypertension   . Hypertensive heart disease with heart failure ([place]) 06/09/2015  . PAF (paroxysmal atrial fibrillation)  ([place]) 06/09/2015   Overview:  CHADS2 vasc score=6  . Renal insufficiency   . Tobacco abuse   . Tremors of nervous system     Past Surgical History:  Procedure Laterality Date  . ABDOMINAL HYSTERECTOMY    . CORONARY ARTERY BYPASS GRAFT  2012  . NOSE SURGERY      MEDICATIONS: .  acetaminophen (TYLENOL) 650 MG CR tablet  . albuterol (PROVENTIL HFA;VENTOLIN HFA) 108 (90 Base) MCG/ACT inhaler  . albuterol (PROVENTIL) (2.5 MG/3ML) 0.083% nebulizer solution  . amLODipine (NORVASC) 10 MG tablet  . Artificial Tear Solution (SOOTHE XP) SOLN  . aspirin (GOODSENSE ASPIRIN) 325 MG tablet  . atorvastatin (LIPITOR) 20 MG tablet  . budesonide-formoterol (SYMBICORT) 160-4.5 MCG/ACT inhaler  . buPROPion (WELLBUTRIN XL) 150 MG 24 hr tablet  . dapagliflozin propanediol (FARXIGA) 10 MG TABS tablet  . ezetimibe (ZETIA) 10 MG tablet  . HYDROcodone-acetaminophen (NORCO/VICODIN) 5-325 MG tablet  . HYDROcodone-acetaminophen (NORCO/VICODIN) 5-325 MG tablet  . Memantine HCl-Donepezil HCl (NAMZARIC) 28-10 MG CP24  . montelukast (SINGULAIR) 10 MG tablet  . Multiple Vitamin (MULTIVITAMIN) tablet  . nystatin-triamcinolone (MYCOLOG II) cream  . omeprazole (PRILOSEC) 20 MG capsule  . pregabalin (LYRICA) 75 MG capsule  . quinapril (ACCUPRIL) 40 MG tablet  . sertraline (ZOLOFT) 50 MG tablet  . sodium chloride (OCEAN) 0.65 % SOLN nasal spray  . telmisartan (MICARDIS) 40 MG tablet  . tiZANidine (ZANAFLEX) 2 MG tablet  . torsemide (DEMADEX) 20 MG tablet   . betamethasone acetate-betamethasone sodium phosphate (CELESTONE) injection 12 mg    George Hugh [facility] Short Stay Center/Anesthesiology Phone [phone] 06/01/2018 5:31 PM

## 2018-05-31 NOTE — Pre-Procedure Instructions (Signed)
Cheryl Summers  05/31/2018      [facility] - [address], [address] [place] [address] Phone: [phone] Fax: [phone]    Your procedure is scheduled on Tuesday August 13.  Report to [facility] Admitting at 5:30 A.M.  Call this number if you have problems the morning of surgery:  [phone]   Remember:  Do not eat or drink after midnight.    Take these medicines the morning of surgery with A SIP OF WATER:   Amlodipine (norvasc) Bupropion (wellbutrin) Namzarc Pregabalin (Lyrica) Sertraline (zoloft) Omeprazole (prilosec) if needed Albuterol if needed (please bring inhaler to hospital with you) Symbicort BREO Hydrocodone-acetaminophen ([place]) if needed  DO NOT TAKE Farxiga the day of surgery  7 days prior to surgery STOP taking any Aspirin(unless otherwise instructed by your surgeon), Aleve, Naproxen, Ibuprofen, Motrin, Advil, Goody's, BC's, all herbal medications, fish oil, and all vitamins     Do not wear jewelry, make-up or nail polish.  Do not wear lotions, powders, or perfumes, or deodorant.  Do not shave 48 hours prior to surgery.  Men may shave face and neck.  Do not bring valuables to the hospital.  [facility] is not responsible for any belongings or valuables.  Contacts, dentures or bridgework may not be worn into surgery.  Leave your suitcase in the car.  After surgery it may be brought to your room.  For patients admitted to the hospital, discharge time will be determined by your treatment team.  Patients discharged the day of surgery will not be allowed to drive home.   Special instructions:    [facility]- Preparing For Surgery  Before surgery, you can play an important role. Because skin is not sterile, your skin needs to be as free of germs as possible. You can reduce the number of germs on your skin by washing with CHG (chlorahexidine gluconate) Soap before surgery.  CHG is an antiseptic  cleaner which kills germs and bonds with the skin to continue killing germs even after washing.    Oral Hygiene is also important to reduce your risk of infection.  Remember - BRUSH YOUR TEETH THE MORNING OF SURGERY WITH YOUR REGULAR TOOTHPASTE  Please do not use if you have an allergy to CHG or antibacterial soaps. If your skin becomes reddened/irritated stop using the CHG.  Do not shave (including legs and underarms) for at least 48 hours prior to first CHG shower. It is OK to shave your face.  Please follow these instructions carefully.   1. Shower the NIGHT BEFORE SURGERY and the MORNING OF SURGERY with CHG.   2. If you chose to wash your hair, wash your hair first as usual with your normal shampoo.  3. After you shampoo, rinse your hair and body thoroughly to remove the shampoo.  4. Use CHG as you would any other liquid soap. You can apply CHG directly to the skin and wash gently with a scrungie or a clean washcloth.   5. Apply the CHG Soap to your body ONLY FROM THE NECK DOWN.  Do not use on open wounds or open sores. Avoid contact with your eyes, ears, mouth and genitals (private parts). Wash Face and genitals (private parts)  with your normal soap.  6. Wash thoroughly, paying special attention to the area where your surgery will be performed.  7. Thoroughly rinse your body with warm water from the neck down.  8. DO NOT  shower/wash with your normal soap after using and rinsing off the CHG Soap.  9. Pat yourself dry with a CLEAN TOWEL.  10. Wear CLEAN PAJAMAS to bed the night before surgery, wear comfortable clothes the morning of surgery  11. Place CLEAN SHEETS on your bed the night of your first shower and DO NOT SLEEP WITH PETS.    Day of Surgery:  Do not apply any deodorants/lotions.  Please wear clean clothes to the hospital/surgery center.   Remember to brush your teeth WITH YOUR REGULAR TOOTHPASTE.    Please read over the following fact sheets that you were  given. Coughing and Deep Breathing, MRSA Information and Surgical Site Infection Prevention

## 2018-06-01 ENCOUNTER — Encounter (HOSPITAL_COMMUNITY)
Admission: RE | Admit: 2018-06-01 | Discharge: 2018-06-01 | Disposition: A | Discharge status: Home / Self Care | Payer: PPO | Source: Ambulatory Visit | Attending: Specialist | Admitting: Specialist

## 2018-06-01 ENCOUNTER — Ambulatory Visit (INDEPENDENT_AMBULATORY_CARE_PROVIDER_SITE_OTHER): Payer: PPO | Admitting: Surgery

## 2018-06-01 ENCOUNTER — Encounter (INDEPENDENT_AMBULATORY_CARE_PROVIDER_SITE_OTHER): Payer: Self-pay | Admitting: Surgery

## 2018-06-01 ENCOUNTER — Encounter (HOSPITAL_COMMUNITY): Payer: Self-pay

## 2018-06-01 VITALS — BP 128/66 | HR 70 | Ht 64.0 in | Wt 213.0 lb

## 2018-06-01 DIAGNOSIS — M48062 Spinal stenosis, lumbar region with neurogenic claudication: Secondary | ICD-10-CM

## 2018-06-01 DIAGNOSIS — Z79899 Other long term (current) drug therapy: Secondary | ICD-10-CM | POA: Diagnosis not present

## 2018-06-01 DIAGNOSIS — M48061 Spinal stenosis, lumbar region without neurogenic claudication: Secondary | ICD-10-CM | POA: Diagnosis not present

## 2018-06-01 DIAGNOSIS — K219 Gastro-esophageal reflux disease without esophagitis: Secondary | ICD-10-CM | POA: Insufficient documentation

## 2018-06-01 DIAGNOSIS — E119 Type 2 diabetes mellitus without complications: Secondary | ICD-10-CM | POA: Diagnosis not present

## 2018-06-01 DIAGNOSIS — I5032 Chronic diastolic (congestive) heart failure: Secondary | ICD-10-CM | POA: Diagnosis not present

## 2018-06-01 DIAGNOSIS — I480 Paroxysmal atrial fibrillation: Secondary | ICD-10-CM | POA: Diagnosis not present

## 2018-06-01 DIAGNOSIS — I110 Hypertensive heart disease with heart failure: Secondary | ICD-10-CM | POA: Diagnosis not present

## 2018-06-01 DIAGNOSIS — J449 Chronic obstructive pulmonary disease, unspecified: Secondary | ICD-10-CM | POA: Diagnosis not present

## 2018-06-01 DIAGNOSIS — Z7982 Long term (current) use of aspirin: Secondary | ICD-10-CM | POA: Diagnosis not present

## 2018-06-01 DIAGNOSIS — Z01812 Encounter for preprocedural laboratory examination: Secondary | ICD-10-CM | POA: Diagnosis not present

## 2018-06-01 DIAGNOSIS — Z951 Presence of aortocoronary bypass graft: Secondary | ICD-10-CM | POA: Insufficient documentation

## 2018-06-01 DIAGNOSIS — E785 Hyperlipidemia, unspecified: Secondary | ICD-10-CM | POA: Insufficient documentation

## 2018-06-01 HISTORY — DX: Disorder of kidney and ureter, unspecified: N28.9

## 2018-06-01 HISTORY — DX: Major depressive disorder, single episode, unspecified: F32.9

## 2018-06-01 HISTORY — DX: Anxiety disorder, unspecified: F41.9

## 2018-06-01 HISTORY — DX: Type 2 diabetes mellitus without complications: E11.9

## 2018-06-01 HISTORY — DX: Tremor, unspecified: R25.1

## 2018-06-01 HISTORY — DX: Cardiac arrhythmia, unspecified: I49.9

## 2018-06-01 HISTORY — DX: Depression, unspecified: F32.A

## 2018-06-01 LAB — URINALYSIS, ROUTINE W REFLEX MICROSCOPIC
BACTERIA UA: NONE SEEN
BILIRUBIN URINE: NEGATIVE
Glucose, UA: >=500 mg/dL — AB
Hgb urine dipstick: NEGATIVE
Ketones, ur: NEGATIVE mg/dL
Leukocytes, UA: NEGATIVE
Nitrite: NEGATIVE
Protein, ur: NEGATIVE mg/dL
Specific Gravity, Urine: 1.003 — ABNORMAL LOW (ref 1.005–1.030)
pH: 5 (ref 5.0–8.0)

## 2018-06-01 LAB — COMPREHENSIVE METABOLIC PANEL
ALBUMIN: 3.9 g/dL (ref 3.5–5.0)
ALK PHOS: 101 U/L (ref 38–126)
ALT: 25 U/L (ref 0–44)
AST: 26 U/L (ref 15–41)
Anion gap: 12 (ref 5–15)
BILIRUBIN TOTAL: 0.9 mg/dL (ref 0.3–1.2)
BUN: 24 mg/dL — AB (ref 8–23)
CALCIUM: 9.7 mg/dL (ref 8.9–10.3)
CO2: 28 mmol/L (ref 22–32)
CREATININE: 1.77 mg/dL — AB (ref 0.44–1.00)
Chloride: 107 mmol/L (ref 98–111)
GFR calc Af Amer: 30 mL/min — ABNORMAL LOW (ref >60)
GFR calc non Af Amer: 26 mL/min — ABNORMAL LOW (ref >60)
GLUCOSE: 162 mg/dL — AB (ref 70–99)
Potassium: 4.2 mmol/L (ref 3.5–5.1)
Sodium: 147 mmol/L — ABNORMAL HIGH (ref 135–145)
TOTAL PROTEIN: 6.8 g/dL (ref 6.5–8.1)

## 2018-06-01 LAB — SURGICAL PCR SCREEN
MRSA, PCR: NEGATIVE
Staphylococcus aureus: NEGATIVE

## 2018-06-01 LAB — APTT: APTT: 30 s (ref 24–36)

## 2018-06-01 LAB — CBC
HEMATOCRIT: 40.1 % (ref 36.0–46.0)
HEMOGLOBIN: 12 g/dL (ref 12.0–15.0)
MCH: 29.6 pg (ref 26.0–34.0)
MCHC: 29.9 g/dL — ABNORMAL LOW (ref 30.0–36.0)
MCV: 98.8 fL (ref 78.0–100.0)
Platelets: 217 10*3/uL (ref 150–400)
RBC: 4.06 MIL/uL (ref 3.87–5.11)
RDW: 14.3 % (ref 11.5–15.5)
WBC: 7.1 10*3/uL (ref 4.0–10.5)

## 2018-06-01 LAB — PROTIME-INR
INR: 0.94
Prothrombin Time: 12.5 seconds (ref 11.4–15.2)

## 2018-06-01 LAB — GLUCOSE, CAPILLARY: Glucose-Capillary: 139 mg/dL — ABNORMAL HIGH (ref 70–99)

## 2018-06-01 LAB — ABO/RH: ABO/RH(D): O POS

## 2018-06-01 MED ORDER — HYDROCODONE-ACETAMINOPHEN 5-325 MG PO TABS: 1.0000 | ORAL_TABLET | Freq: Four times a day (QID) | ORAL | 0 refills | Status: DC | PRN

## 2018-06-01 NOTE — Progress Notes (Signed)
49 77-year-old white female history of lumbar stenosis comes in for preop evaluation.  We have received preop medical and cardiac clearances.  Today full history and physical performed.

## 2018-06-05 NOTE — Anesthesia Preprocedure Evaluation (Addendum)
Anesthesia Evaluation  Patient identified by MRN, date of birth, ID band Patient awake    Reviewed: Allergy & Precautions, NPO status , Patient's Chart, lab work & pertinent test results  History of Anesthesia Complications Negative for: history of anesthetic complications  Airway Mallampati: III  TM Distance: >3 FB Neck ROM: Full    Dental  (+) Edentulous Upper, Edentulous Lower, Dental Advisory Given   Pulmonary COPD,  COPD inhaler, former smoker,    breath sounds clear to auscultation       Cardiovascular hypertension, Pt. on medications + CAD, + CABG (2012, no futher details known), + Peripheral Vascular Disease and +CHF (diastolic CHF)  + dysrhythmias (paroxysmal afib, not on anticoagulation secondary to unstable gait)  Rhythm:Regular Rate:Normal  05/05/2018 Carotid duplex: 40-59% stenosis of both right and left ICA  03/03/18 TTE EF 00-37%, grade 1 diastolic dysfunction, no valvular abnormalities    Neuro/Psych Anxiety Depression negative neurological ROS     GI/Hepatic Neg liver ROS, GERD  Medicated,  Endo/Other  diabetes, Type 2, Oral Hypoglycemic Agents  Renal/GU Renal InsufficiencyRenal disease (Cr. 1.6)negative Renal ROS     Musculoskeletal Lumbar stenosis   Abdominal   Peds  Hematology negative hematology ROS (+)   Anesthesia Other Findings Day of surgery medications reviewed with the patient.  Reproductive/Obstetrics                            Anesthesia Physical Anesthesia Plan  ASA: III  Anesthesia Plan: General   Post-op Pain Management:    Induction: Intravenous  PONV Risk Score and Plan: 3 and Dexamethasone and Ondansetron  Airway Management Planned: Oral ETT  Additional Equipment:   Intra-op Plan:   Post-operative Plan: Extubation in OR  Informed Consent: I have reviewed the patients History and Physical, chart, labs and discussed the procedure including the  risks, benefits and alternatives for the proposed anesthesia with the patient or authorized representative who has indicated his/her understanding and acceptance.   Dental advisory given  Plan Discussed with: CRNA  Anesthesia Plan Comments:        Anesthesia Quick Evaluation

## 2018-06-05 NOTE — H&P (Signed)
Cheryl Summers is an 77 y.o. female.   Chief Complaint: Back pain and leg pain HPI: [age]-year-old female history of lumbar stenosis and HNP and the above complaint presents for surgical intervention.  Progressively worsening symptoms.  Failed conservative treatment.  Past Medical History:  Diagnosis Date  . Anxiety   . Carotid artery stenosis, asymptomatic    50% ASYMPTOMATIC  RIGHT CAROTID STENOSIS  . Chronic diastolic heart failure ([place]) 06/09/2015  . COPD (chronic obstructive pulmonary disease) ([place])   . Depression   . Diabetes mellitus without complication ([place] [facility])   . Dysrhythmia   . Gastroesophageal reflux   . Hyperlipidemia   . Hypertension   . Hypertensive heart disease with heart failure ([place]) 06/09/2015  . PAF (paroxysmal atrial fibrillation) ([place]) 06/09/2015   Overview:  CHADS2 vasc score=6  . Renal insufficiency   . Tobacco abuse   . Tremors of nervous system     Past Surgical History:  Procedure Laterality Date  . ABDOMINAL HYSTERECTOMY    . CORONARY ARTERY BYPASS GRAFT  2012  . NOSE SURGERY      Family History  Problem Relation Age of Onset  . Heart attack Mother   . Hypertension Mother   . Hyperlipidemia Mother   . Heart attack Father   . Hypertension Father   . Hyperlipidemia Father   . Heart attack Sister   . Stroke Brother   . Heart attack Brother   . Hypertension Daughter    Social History:  reports that she has quit smoking. She has never used smokeless tobacco. She reports that she does not drink alcohol or use drugs.  Allergies: No Known Allergies  No medications prior to admission.    No results found for this or any previous visit (from the past 48 hour(s)). No results found.  Review of Systems  Constitutional: Negative.   HENT: Negative.   Respiratory: Negative.   Cardiovascular: Negative.   Skin: Negative.     There were no vitals taken for this visit. Physical Exam  Constitutional: She is oriented to person, place, and time. She  appears well-developed. No distress.  HENT:  Head: Normocephalic and atraumatic.  Eyes: Pupils are equal, round, and reactive to light. EOM are normal.  Cardiovascular: Normal rate.  Respiratory: Effort normal. No respiratory distress.  GI: She exhibits no distension.  Musculoskeletal: She exhibits tenderness.  Neurological: She is alert and oriented to person, place, and time.  Skin: Skin is warm and dry.  Psychiatric: She has a normal mood and affect.     Assessment/Plan Lumbar stenosis and HNP  We will proceed with CENTRAL LAMINECTOMY L2-3, L3-4, L4-5, BILATERAL PARTIAL HEMILAMINECTOMY L5-S1, RIGHT L4-5 TRANSFORAMINAL LUMBAR INTERBODY FUSION WITH CAGE, MPACT SCREWS AND RODS, VIVIGEN, LOCAL AND ALLOGRAFT BONE GRAFT as scheduled.  Surgical procedure along with possible rehab/recovery time discussed.  All questions answered.  Benjiman Core, PA-C 06/05/2018, 3:27 PM

## 2018-06-06 ENCOUNTER — Inpatient Hospital Stay (HOSPITAL_COMMUNITY): Payer: PPO | Admitting: Anesthesiology

## 2018-06-06 ENCOUNTER — Encounter (HOSPITAL_COMMUNITY)
Admission: RE | Disposition: A | Discharge status: Skilled Nursing Facility | Payer: Self-pay | Source: Ambulatory Visit | Attending: Specialist

## 2018-06-06 ENCOUNTER — Inpatient Hospital Stay (HOSPITAL_COMMUNITY): Payer: PPO

## 2018-06-06 ENCOUNTER — Encounter (HOSPITAL_COMMUNITY): Payer: Self-pay | Admitting: Certified Registered"

## 2018-06-06 ENCOUNTER — Inpatient Hospital Stay (HOSPITAL_COMMUNITY): Payer: PPO | Admitting: Vascular Surgery

## 2018-06-06 ENCOUNTER — Inpatient Hospital Stay (HOSPITAL_COMMUNITY)
Admission: RE | Admit: 2018-06-06 | Discharge: 2018-06-10 | DRG: 453 | Disposition: A | Discharge status: Skilled Nursing Facility | Payer: PPO | Source: Ambulatory Visit | Attending: Specialist | Admitting: Specialist

## 2018-06-06 DIAGNOSIS — Z981 Arthrodesis status: Secondary | ICD-10-CM | POA: Diagnosis not present

## 2018-06-06 DIAGNOSIS — D519 Vitamin B12 deficiency anemia, unspecified: Secondary | ICD-10-CM | POA: Diagnosis not present

## 2018-06-06 DIAGNOSIS — G9782 Other postprocedural complications and disorders of nervous system: Secondary | ICD-10-CM | POA: Diagnosis not present

## 2018-06-06 DIAGNOSIS — F419 Anxiety disorder, unspecified: Secondary | ICD-10-CM | POA: Diagnosis not present

## 2018-06-06 DIAGNOSIS — I959 Hypotension, unspecified: Secondary | ICD-10-CM

## 2018-06-06 DIAGNOSIS — I5030 Unspecified diastolic (congestive) heart failure: Secondary | ICD-10-CM | POA: Diagnosis not present

## 2018-06-06 DIAGNOSIS — M4316 Spondylolisthesis, lumbar region: Secondary | ICD-10-CM | POA: Diagnosis not present

## 2018-06-06 DIAGNOSIS — I480 Paroxysmal atrial fibrillation: Secondary | ICD-10-CM | POA: Diagnosis not present

## 2018-06-06 DIAGNOSIS — J449 Chronic obstructive pulmonary disease, unspecified: Secondary | ICD-10-CM | POA: Diagnosis present

## 2018-06-06 DIAGNOSIS — K5900 Constipation, unspecified: Secondary | ICD-10-CM | POA: Diagnosis not present

## 2018-06-06 DIAGNOSIS — Z6835 Body mass index (BMI) 35.0-35.9, adult: Secondary | ICD-10-CM

## 2018-06-06 DIAGNOSIS — I2510 Atherosclerotic heart disease of native coronary artery without angina pectoris: Secondary | ICD-10-CM | POA: Diagnosis present

## 2018-06-06 DIAGNOSIS — I10 Essential (primary) hypertension: Secondary | ICD-10-CM | POA: Diagnosis not present

## 2018-06-06 DIAGNOSIS — I9589 Other hypotension: Secondary | ICD-10-CM | POA: Diagnosis not present

## 2018-06-06 DIAGNOSIS — K219 Gastro-esophageal reflux disease without esophagitis: Secondary | ICD-10-CM | POA: Diagnosis present

## 2018-06-06 DIAGNOSIS — E1142 Type 2 diabetes mellitus with diabetic polyneuropathy: Secondary | ICD-10-CM | POA: Diagnosis not present

## 2018-06-06 DIAGNOSIS — D72829 Elevated white blood cell count, unspecified: Secondary | ICD-10-CM | POA: Diagnosis not present

## 2018-06-06 DIAGNOSIS — M5489 Other dorsalgia: Secondary | ICD-10-CM | POA: Diagnosis not present

## 2018-06-06 DIAGNOSIS — N170 Acute kidney failure with tubular necrosis: Secondary | ICD-10-CM | POA: Diagnosis not present

## 2018-06-06 DIAGNOSIS — G960 Cerebrospinal fluid leak: Secondary | ICD-10-CM | POA: Diagnosis not present

## 2018-06-06 DIAGNOSIS — M48062 Spinal stenosis, lumbar region with neurogenic claudication: Principal | ICD-10-CM | POA: Diagnosis present

## 2018-06-06 DIAGNOSIS — Z951 Presence of aortocoronary bypass graft: Secondary | ICD-10-CM | POA: Diagnosis not present

## 2018-06-06 DIAGNOSIS — Z87891 Personal history of nicotine dependence: Secondary | ICD-10-CM | POA: Diagnosis not present

## 2018-06-06 DIAGNOSIS — E119 Type 2 diabetes mellitus without complications: Secondary | ICD-10-CM | POA: Diagnosis present

## 2018-06-06 DIAGNOSIS — F329 Major depressive disorder, single episode, unspecified: Secondary | ICD-10-CM | POA: Diagnosis not present

## 2018-06-06 DIAGNOSIS — Z419 Encounter for procedure for purposes other than remedying health state, unspecified: Secondary | ICD-10-CM

## 2018-06-06 DIAGNOSIS — E669 Obesity, unspecified: Secondary | ICD-10-CM | POA: Diagnosis not present

## 2018-06-06 DIAGNOSIS — I110 Hypertensive heart disease with heart failure: Secondary | ICD-10-CM | POA: Diagnosis not present

## 2018-06-06 DIAGNOSIS — M2550 Pain in unspecified joint: Secondary | ICD-10-CM | POA: Diagnosis not present

## 2018-06-06 DIAGNOSIS — M4807 Spinal stenosis, lumbosacral region: Secondary | ICD-10-CM | POA: Diagnosis not present

## 2018-06-06 DIAGNOSIS — Z7951 Long term (current) use of inhaled steroids: Secondary | ICD-10-CM

## 2018-06-06 DIAGNOSIS — R278 Other lack of coordination: Secondary | ICD-10-CM | POA: Diagnosis not present

## 2018-06-06 DIAGNOSIS — D62 Acute posthemorrhagic anemia: Secondary | ICD-10-CM | POA: Diagnosis not present

## 2018-06-06 DIAGNOSIS — D509 Iron deficiency anemia, unspecified: Secondary | ICD-10-CM | POA: Diagnosis not present

## 2018-06-06 DIAGNOSIS — E785 Hyperlipidemia, unspecified: Secondary | ICD-10-CM | POA: Diagnosis present

## 2018-06-06 DIAGNOSIS — Z7984 Long term (current) use of oral hypoglycemic drugs: Secondary | ICD-10-CM

## 2018-06-06 DIAGNOSIS — I5032 Chronic diastolic (congestive) heart failure: Secondary | ICD-10-CM | POA: Diagnosis not present

## 2018-06-06 DIAGNOSIS — I9581 Postprocedural hypotension: Secondary | ICD-10-CM | POA: Diagnosis not present

## 2018-06-06 DIAGNOSIS — N289 Disorder of kidney and ureter, unspecified: Secondary | ICD-10-CM | POA: Diagnosis not present

## 2018-06-06 DIAGNOSIS — Z79899 Other long term (current) drug therapy: Secondary | ICD-10-CM

## 2018-06-06 DIAGNOSIS — Z7401 Bed confinement status: Secondary | ICD-10-CM | POA: Diagnosis not present

## 2018-06-06 DIAGNOSIS — Y838 Other surgical procedures as the cause of abnormal reaction of the patient, or of later complication, without mention of misadventure at the time of the procedure: Secondary | ICD-10-CM | POA: Diagnosis not present

## 2018-06-06 DIAGNOSIS — E861 Hypovolemia: Secondary | ICD-10-CM | POA: Diagnosis not present

## 2018-06-06 DIAGNOSIS — R0989 Other specified symptoms and signs involving the circulatory and respiratory systems: Secondary | ICD-10-CM | POA: Diagnosis not present

## 2018-06-06 LAB — POCT I-STAT 7, (LYTES, BLD GAS, ICA,H+H)
ACID-BASE DEFICIT: 1 mmol/L (ref 0.0–2.0)
Bicarbonate: 24.2 mmol/L (ref 20.0–28.0)
CALCIUM ION: 1.16 mmol/L (ref 1.15–1.40)
HEMATOCRIT: 26 % — AB (ref 36.0–46.0)
HEMOGLOBIN: 8.8 g/dL — AB (ref 12.0–15.0)
O2 SAT: 100 %
PH ART: 7.347 — AB (ref 7.350–7.450)
PO2 ART: 251 mmHg — AB (ref 83.0–108.0)
POTASSIUM: 4.7 mmol/L (ref 3.5–5.1)
SODIUM: 143 mmol/L (ref 135–145)
TCO2: 26 mmol/L (ref 22–32)
pCO2 arterial: 44.2 mmHg (ref 32.0–48.0)

## 2018-06-06 LAB — GLUCOSE, CAPILLARY
GLUCOSE-CAPILLARY: 145 mg/dL — AB (ref 70–99)
GLUCOSE-CAPILLARY: 168 mg/dL — AB (ref 70–99)
GLUCOSE-CAPILLARY: 169 mg/dL — AB (ref 70–99)

## 2018-06-06 LAB — HEMOGLOBIN A1C
HEMOGLOBIN A1C: 5.4 % (ref 4.8–5.6)
MEAN PLASMA GLUCOSE: 108.28 mg/dL

## 2018-06-06 SURGERY — POSTERIOR LUMBAR FUSION 1 LEVEL
Anesthesia: General

## 2018-06-06 MED ORDER — MONTELUKAST SODIUM 10 MG PO TABS
10.0000 mg | ORAL_TABLET | Freq: Every day | ORAL | Status: DC
  Administered 2018-06-06 – 2018-06-07 (×2): 10 mg via ORAL
  Filled 2018-06-06 (×2): qty 1

## 2018-06-06 MED ORDER — FLEET ENEMA 7-19 GM/118ML RE ENEM: 1.0000 | ENEMA | Freq: Once | RECTAL | Status: DC | PRN

## 2018-06-06 MED ORDER — SODIUM CHLORIDE 0.9% FLUSH
3.0000 mL | Freq: Two times a day (BID) | INTRAVENOUS | Status: DC
  Administered 2018-06-08 – 2018-06-10 (×4): 3 mL via INTRAVENOUS

## 2018-06-06 MED ORDER — THROMBIN 20000 UNITS EX KIT
PACK | CUTANEOUS | Status: AC
  Filled 2018-06-06: qty 1

## 2018-06-06 MED ORDER — PROPOFOL 10 MG/ML IV BOLUS
INTRAVENOUS | Status: DC | PRN
  Administered 2018-06-06: 120 mg via INTRAVENOUS

## 2018-06-06 MED ORDER — CEFAZOLIN SODIUM-DEXTROSE 2-4 GM/100ML-% IV SOLN
2.0000 g | INTRAVENOUS | Status: AC
  Administered 2018-06-06: 2 g via INTRAVENOUS
  Filled 2018-06-06: qty 100

## 2018-06-06 MED ORDER — PHENYLEPHRINE 40 MCG/ML (10ML) SYRINGE FOR IV PUSH (FOR BLOOD PRESSURE SUPPORT)
PREFILLED_SYRINGE | INTRAVENOUS | Status: DC | PRN
  Administered 2018-06-06 (×2): 40 ug via INTRAVENOUS

## 2018-06-06 MED ORDER — TIZANIDINE HCL 4 MG PO TABS
2.0000 mg | ORAL_TABLET | Freq: Every day | ORAL | Status: DC
  Administered 2018-06-06 – 2018-06-09 (×4): 2 mg via ORAL
  Filled 2018-06-06 (×4): qty 1

## 2018-06-06 MED ORDER — SERTRALINE HCL 50 MG PO TABS
50.0000 mg | ORAL_TABLET | Freq: Every day | ORAL | Status: DC
  Administered 2018-06-06 – 2018-06-09 (×4): 50 mg via ORAL
  Filled 2018-06-06 (×4): qty 1

## 2018-06-06 MED ORDER — EVICEL 2 ML EX KIT
PACK | CUTANEOUS | Status: DC | PRN
  Administered 2018-06-06: 2 mL

## 2018-06-06 MED ORDER — BUPIVACAINE LIPOSOME 1.3 % IJ SUSP
20.0000 mL | INTRAMUSCULAR | Status: AC
  Administered 2018-06-06: 20 mL
  Filled 2018-06-06: qty 20

## 2018-06-06 MED ORDER — FENTANYL CITRATE (PF) 100 MCG/2ML IJ SOLN
INTRAMUSCULAR | Status: DC | PRN
  Administered 2018-06-06 (×5): 25 ug via INTRAVENOUS
  Administered 2018-06-06: 50 ug via INTRAVENOUS
  Administered 2018-06-06: 25 ug via INTRAVENOUS
  Administered 2018-06-06: 50 ug via INTRAVENOUS

## 2018-06-06 MED ORDER — LIDOCAINE 2% (20 MG/ML) 5 ML SYRINGE
INTRAMUSCULAR | Status: DC | PRN
  Administered 2018-06-06: 100 mg via INTRAVENOUS

## 2018-06-06 MED ORDER — ONDANSETRON HCL 4 MG/2ML IJ SOLN
INTRAMUSCULAR | Status: DC | PRN
  Administered 2018-06-06: 4 mg via INTRAVENOUS

## 2018-06-06 MED ORDER — LACTATED RINGERS IV SOLN
INTRAVENOUS | Status: DC | PRN
  Administered 2018-06-06 (×2): via INTRAVENOUS

## 2018-06-06 MED ORDER — INSULIN ASPART 100 UNIT/ML ~~LOC~~ SOLN
0.0000 [IU] | Freq: Three times a day (TID) | SUBCUTANEOUS | Status: DC
  Administered 2018-06-07 – 2018-06-10 (×6): 2 [IU] via SUBCUTANEOUS

## 2018-06-06 MED ORDER — ONDANSETRON HCL 4 MG PO TABS: 4.0000 mg | ORAL_TABLET | Freq: Four times a day (QID) | ORAL | Status: DC | PRN

## 2018-06-06 MED ORDER — POLYETHYLENE GLYCOL 3350 17 G PO PACK
17.0000 g | PACK | Freq: Every day | ORAL | Status: DC | PRN
  Administered 2018-06-09: 17 g via ORAL
  Filled 2018-06-06: qty 1

## 2018-06-06 MED ORDER — ADULT MULTIVITAMIN W/MINERALS CH
1.0000 | ORAL_TABLET | Freq: Every day | ORAL | Status: DC
  Administered 2018-06-07 – 2018-06-10 (×4): 1 via ORAL
  Filled 2018-06-06 (×4): qty 1

## 2018-06-06 MED ORDER — METHOCARBAMOL 1000 MG/10ML IJ SOLN
500.0000 mg | Freq: Four times a day (QID) | INTRAVENOUS | Status: DC | PRN
  Filled 2018-06-06: qty 5

## 2018-06-06 MED ORDER — HYDROCODONE-ACETAMINOPHEN 7.5-325 MG PO TABS
1.0000 | ORAL_TABLET | ORAL | Status: DC | PRN
  Administered 2018-06-09: 1 via ORAL

## 2018-06-06 MED ORDER — MEMANTINE HCL-DONEPEZIL HCL 28-10 MG PO CP24
1.0000 | ORAL_CAPSULE | Freq: Every day | ORAL | Status: DC
Start: 2018-06-06 — End: 2018-06-06

## 2018-06-06 MED ORDER — LISINOPRIL 40 MG PO TABS
40.0000 mg | ORAL_TABLET | Freq: Every day | ORAL | Status: DC
  Filled 2018-06-06: qty 1

## 2018-06-06 MED ORDER — SODIUM CHLORIDE 0.9 % IV SOLN
INTRAVENOUS | Status: DC
  Administered 2018-06-06 – 2018-06-07 (×2): via INTRAVENOUS

## 2018-06-06 MED ORDER — MENTHOL 3 MG MT LOZG: 1.0000 | LOZENGE | OROMUCOSAL | Status: DC | PRN

## 2018-06-06 MED ORDER — PHENOL 1.4 % MT LIQD: 1.0000 | OROMUCOSAL | Status: DC | PRN

## 2018-06-06 MED ORDER — HEMOSTATIC AGENTS (NO CHARGE) OPTIME
TOPICAL | Status: DC | PRN
  Administered 2018-06-06: 1 via TOPICAL

## 2018-06-06 MED ORDER — KETOROLAC TROMETHAMINE 15 MG/ML IJ SOLN
7.5000 mg | Freq: Four times a day (QID) | INTRAMUSCULAR | Status: AC
  Administered 2018-06-06 – 2018-06-07 (×4): 7.5 mg via INTRAVENOUS
  Filled 2018-06-06 (×4): qty 1

## 2018-06-06 MED ORDER — SALINE SPRAY 0.65 % NA SOLN
1.0000 | NASAL | Status: DC | PRN
  Filled 2018-06-06: qty 44

## 2018-06-06 MED ORDER — BUPIVACAINE HCL 0.5 % IJ SOLN
INTRAMUSCULAR | Status: DC | PRN
  Administered 2018-06-06: 20 mL

## 2018-06-06 MED ORDER — MORPHINE SULFATE (PF) 2 MG/ML IV SOLN: 1.0000 mg | INTRAVENOUS | Status: DC | PRN

## 2018-06-06 MED ORDER — SUGAMMADEX SODIUM 200 MG/2ML IV SOLN
INTRAVENOUS | Status: DC | PRN
  Administered 2018-06-06: 200 mg via INTRAVENOUS

## 2018-06-06 MED ORDER — CEFAZOLIN SODIUM-DEXTROSE 2-4 GM/100ML-% IV SOLN
2.0000 g | Freq: Once | INTRAVENOUS | Status: AC
  Administered 2018-06-06: 2 g via INTRAVENOUS
  Filled 2018-06-06: qty 100

## 2018-06-06 MED ORDER — HYDROCODONE-ACETAMINOPHEN 10-325 MG PO TABS
2.0000 | ORAL_TABLET | ORAL | Status: DC | PRN
  Administered 2018-06-06: 2 via ORAL
  Filled 2018-06-06 (×2): qty 2

## 2018-06-06 MED ORDER — BUPIVACAINE HCL (PF) 0.5 % IJ SOLN
INTRAMUSCULAR | Status: AC
  Filled 2018-06-06: qty 30

## 2018-06-06 MED ORDER — ACETAMINOPHEN 650 MG RE SUPP: 650.0000 mg | RECTAL | Status: DC | PRN

## 2018-06-06 MED ORDER — SODIUM CHLORIDE 0.9 % IV SOLN
INTRAVENOUS | Status: DC | PRN
  Administered 2018-06-06: 40 ug/min via INTRAVENOUS
  Administered 2018-06-06: 50 ug/min via INTRAVENOUS

## 2018-06-06 MED ORDER — BUPROPION HCL ER (XL) 150 MG PO TB24
150.0000 mg | ORAL_TABLET | Freq: Every day | ORAL | Status: DC
  Administered 2018-06-06 – 2018-06-09 (×4): 150 mg via ORAL
  Filled 2018-06-06 (×4): qty 1

## 2018-06-06 MED ORDER — HYDROMORPHONE HCL 1 MG/ML IJ SOLN
INTRAMUSCULAR | Status: AC
  Filled 2018-06-06: qty 1

## 2018-06-06 MED ORDER — THROMBIN 20000 UNITS EX SOLR
CUTANEOUS | Status: DC | PRN
  Administered 2018-06-06: 20 mL via TOPICAL

## 2018-06-06 MED ORDER — CHLORHEXIDINE GLUCONATE 4 % EX LIQD: 60.0000 mL | Freq: Once | CUTANEOUS | Status: DC

## 2018-06-06 MED ORDER — QUINAPRIL HCL 10 MG PO TABS: 40.0000 mg | ORAL_TABLET | Freq: Every day | ORAL | Status: DC

## 2018-06-06 MED ORDER — LACTATED RINGERS IV SOLN
INTRAVENOUS | Status: DC | PRN
  Administered 2018-06-06: 07:00:00 via INTRAVENOUS

## 2018-06-06 MED ORDER — PROPOFOL 10 MG/ML IV BOLUS
INTRAVENOUS | Status: AC
  Filled 2018-06-06: qty 20

## 2018-06-06 MED ORDER — ALBUTEROL SULFATE HFA 108 (90 BASE) MCG/ACT IN AERS: 2.0000 | INHALATION_SPRAY | Freq: Four times a day (QID) | RESPIRATORY_TRACT | Status: DC | PRN

## 2018-06-06 MED ORDER — ONDANSETRON HCL 4 MG/2ML IJ SOLN
INTRAMUSCULAR | Status: AC
  Filled 2018-06-06: qty 2

## 2018-06-06 MED ORDER — EPHEDRINE 5 MG/ML INJ
INTRAVENOUS | Status: AC
  Filled 2018-06-06: qty 10

## 2018-06-06 MED ORDER — DOCUSATE SODIUM 100 MG PO CAPS
100.0000 mg | ORAL_CAPSULE | Freq: Two times a day (BID) | ORAL | Status: DC
  Administered 2018-06-06 – 2018-06-10 (×7): 100 mg via ORAL
  Filled 2018-06-06 (×7): qty 1

## 2018-06-06 MED ORDER — FENTANYL CITRATE (PF) 250 MCG/5ML IJ SOLN
INTRAMUSCULAR | Status: AC
  Filled 2018-06-06: qty 5

## 2018-06-06 MED ORDER — LIDOCAINE 2% (20 MG/ML) 5 ML SYRINGE
INTRAMUSCULAR | Status: AC
  Filled 2018-06-06: qty 5

## 2018-06-06 MED ORDER — DEXAMETHASONE SODIUM PHOSPHATE 10 MG/ML IJ SOLN
INTRAMUSCULAR | Status: DC | PRN
  Administered 2018-06-06: 10 mg via INTRAVENOUS

## 2018-06-06 MED ORDER — PREGABALIN 75 MG PO CAPS
75.0000 mg | ORAL_CAPSULE | Freq: Two times a day (BID) | ORAL | Status: DC
  Administered 2018-06-06 – 2018-06-10 (×8): 75 mg via ORAL
  Filled 2018-06-06 (×8): qty 1

## 2018-06-06 MED ORDER — HYDROCODONE-ACETAMINOPHEN 7.5-325 MG PO TABS
1.0000 | ORAL_TABLET | Freq: Four times a day (QID) | ORAL | Status: DC
  Administered 2018-06-06 – 2018-06-10 (×14): 1 via ORAL
  Filled 2018-06-06 (×15): qty 1

## 2018-06-06 MED ORDER — ACETAMINOPHEN 325 MG PO TABS
650.0000 mg | ORAL_TABLET | ORAL | Status: DC | PRN
  Administered 2018-06-07 – 2018-06-08 (×2): 650 mg via ORAL
  Filled 2018-06-06 (×2): qty 2

## 2018-06-06 MED ORDER — PHENYLEPHRINE 40 MCG/ML (10ML) SYRINGE FOR IV PUSH (FOR BLOOD PRESSURE SUPPORT)
PREFILLED_SYRINGE | INTRAVENOUS | Status: AC
  Filled 2018-06-06: qty 10

## 2018-06-06 MED ORDER — ROCURONIUM BROMIDE 10 MG/ML (PF) SYRINGE
PREFILLED_SYRINGE | INTRAVENOUS | Status: AC
  Filled 2018-06-06: qty 10

## 2018-06-06 MED ORDER — ROCURONIUM BROMIDE 100 MG/10ML IV SOLN
INTRAVENOUS | Status: DC | PRN
  Administered 2018-06-06 (×2): 10 mg via INTRAVENOUS
  Administered 2018-06-06: 20 mg via INTRAVENOUS
  Administered 2018-06-06: 10 mg via INTRAVENOUS
  Administered 2018-06-06: 20 mg via INTRAVENOUS
  Administered 2018-06-06: 50 mg via INTRAVENOUS
  Administered 2018-06-06: 5 mg via INTRAVENOUS

## 2018-06-06 MED ORDER — EPHEDRINE SULFATE-NACL 50-0.9 MG/10ML-% IV SOSY
PREFILLED_SYRINGE | INTRAVENOUS | Status: DC | PRN
  Administered 2018-06-06: 10 mg via INTRAVENOUS
  Administered 2018-06-06 (×2): 5 mg via INTRAVENOUS
  Administered 2018-06-06 (×2): 10 mg via INTRAVENOUS

## 2018-06-06 MED ORDER — 0.9 % SODIUM CHLORIDE (POUR BTL) OPTIME
TOPICAL | Status: DC | PRN
  Administered 2018-06-06: 1000 mL

## 2018-06-06 MED ORDER — MEMANTINE HCL ER 28 MG PO CP24
28.0000 mg | ORAL_CAPSULE | Freq: Every day | ORAL | Status: DC
  Administered 2018-06-07 – 2018-06-10 (×4): 28 mg via ORAL
  Filled 2018-06-06 (×4): qty 1

## 2018-06-06 MED ORDER — SOOTHE XP OP SOLN: 1.0000 [drp] | Freq: Every day | OPHTHALMIC | Status: DC | PRN

## 2018-06-06 MED ORDER — HYDROMORPHONE HCL 1 MG/ML IJ SOLN
0.2500 mg | INTRAMUSCULAR | Status: DC | PRN
  Administered 2018-06-06: 0.5 mg via INTRAVENOUS

## 2018-06-06 MED ORDER — PANTOPRAZOLE SODIUM 40 MG PO TBEC
80.0000 mg | DELAYED_RELEASE_TABLET | Freq: Every day | ORAL | Status: DC
  Administered 2018-06-07 – 2018-06-10 (×4): 80 mg via ORAL
  Filled 2018-06-06 (×4): qty 2

## 2018-06-06 MED ORDER — SODIUM CHLORIDE 0.9% FLUSH: 3.0000 mL | INTRAVENOUS | Status: DC | PRN

## 2018-06-06 MED ORDER — DONEPEZIL HCL 10 MG PO TABS
10.0000 mg | ORAL_TABLET | Freq: Every day | ORAL | Status: DC
  Administered 2018-06-07 – 2018-06-10 (×4): 10 mg via ORAL
  Filled 2018-06-06 (×4): qty 1

## 2018-06-06 MED ORDER — ALUM & MAG HYDROXIDE-SIMETH 200-200-20 MG/5ML PO SUSP: 30.0000 mL | Freq: Four times a day (QID) | ORAL | Status: DC | PRN

## 2018-06-06 MED ORDER — EZETIMIBE 10 MG PO TABS
5.0000 mg | ORAL_TABLET | Freq: Every day | ORAL | Status: DC
  Administered 2018-06-06 – 2018-06-09 (×4): 5 mg via ORAL
  Filled 2018-06-06 (×4): qty 1

## 2018-06-06 MED ORDER — ALBUTEROL SULFATE (2.5 MG/3ML) 0.083% IN NEBU
2.5000 mg | INHALATION_SOLUTION | Freq: Every day | RESPIRATORY_TRACT | Status: DC | PRN
  Administered 2018-06-09: 2.5 mg via RESPIRATORY_TRACT
  Filled 2018-06-06: qty 3

## 2018-06-06 MED ORDER — TORSEMIDE 20 MG PO TABS
20.0000 mg | ORAL_TABLET | Freq: Every day | ORAL | Status: DC
  Administered 2018-06-07: 20 mg via ORAL
  Filled 2018-06-06: qty 1

## 2018-06-06 MED ORDER — NYSTATIN-TRIAMCINOLONE 100000-0.1 UNIT/GM-% EX CREA: 1.0000 "application " | TOPICAL_CREAM | Freq: Every day | CUTANEOUS | Status: DC | PRN

## 2018-06-06 MED ORDER — ATORVASTATIN CALCIUM 20 MG PO TABS
20.0000 mg | ORAL_TABLET | Freq: Every day | ORAL | Status: DC
  Administered 2018-06-06 – 2018-06-09 (×4): 20 mg via ORAL
  Filled 2018-06-06 (×4): qty 1

## 2018-06-06 MED ORDER — SODIUM CHLORIDE 0.9 % IV SOLN
250.0000 mL | INTRAVENOUS | Status: DC
  Administered 2018-06-07: 10:00:00 via INTRAVENOUS

## 2018-06-06 MED ORDER — AMLODIPINE BESYLATE 10 MG PO TABS
10.0000 mg | ORAL_TABLET | Freq: Every day | ORAL | Status: DC
  Filled 2018-06-06: qty 1

## 2018-06-06 MED ORDER — IRBESARTAN 150 MG PO TABS: 150.0000 mg | ORAL_TABLET | Freq: Every day | ORAL | Status: DC

## 2018-06-06 MED ORDER — METHOCARBAMOL 500 MG PO TABS
500.0000 mg | ORAL_TABLET | Freq: Four times a day (QID) | ORAL | Status: DC | PRN
  Administered 2018-06-07: 500 mg via ORAL
  Filled 2018-06-06: qty 1

## 2018-06-06 MED ORDER — ASPIRIN 325 MG PO TABS
325.0000 mg | ORAL_TABLET | Freq: Every day | ORAL | Status: DC
  Administered 2018-06-06 – 2018-06-07 (×2): 325 mg via ORAL
  Filled 2018-06-06 (×2): qty 1

## 2018-06-06 MED ORDER — MOMETASONE FURO-FORMOTEROL FUM 200-5 MCG/ACT IN AERO: 2.0000 | INHALATION_SPRAY | Freq: Two times a day (BID) | RESPIRATORY_TRACT | Status: DC

## 2018-06-06 MED ORDER — BISACODYL 5 MG PO TBEC
5.0000 mg | DELAYED_RELEASE_TABLET | Freq: Every day | ORAL | Status: DC | PRN
  Administered 2018-06-09: 5 mg via ORAL
  Filled 2018-06-06: qty 1

## 2018-06-06 MED ORDER — ONDANSETRON HCL 4 MG/2ML IJ SOLN: 4.0000 mg | Freq: Four times a day (QID) | INTRAMUSCULAR | Status: DC | PRN

## 2018-06-06 SURGICAL SUPPLY — 75 items
BLADE CLIPPER SURG (BLADE) IMPLANT
BONE VIVIGEN FORMABLE 5.4CC (Bone Implant) ×3 IMPLANT
BUR MATCHSTICK NEURO 3.0 LAGG (BURR) ×3 IMPLANT
BUR SABER RD CUTTING 3.0 (BURR) IMPLANT
BUR SABER RD CUTTING 3.0MM (BURR)
COVER BACK TABLE 80X110 HD (DRAPES) ×3 IMPLANT
COVER SURGICAL LIGHT HANDLE (MISCELLANEOUS) ×3 IMPLANT
DECANTER SPIKE VIAL GLASS SM (MISCELLANEOUS) ×3 IMPLANT
DERMABOND ADVANCED (GAUZE/BANDAGES/DRESSINGS) ×2
DERMABOND ADVANCED .7 DNX12 (GAUZE/BANDAGES/DRESSINGS) ×1 IMPLANT
DRAPE C-ARM 42X72 X-RAY (DRAPES) ×3 IMPLANT
DRAPE C-ARMOR (DRAPES) ×3 IMPLANT
DRAPE MICROSCOPE LEICA (MISCELLANEOUS) ×3 IMPLANT
DRAPE POUCH INSTRU U-SHP 10X18 (DRAPES) ×3 IMPLANT
DRAPE SURG 17X23 STRL (DRAPES) ×12 IMPLANT
DRSG MEPILEX BORDER 4X4 (GAUZE/BANDAGES/DRESSINGS) IMPLANT
DRSG MEPILEX BORDER 4X8 (GAUZE/BANDAGES/DRESSINGS) ×3 IMPLANT
DURAPREP 26ML APPLICATOR (WOUND CARE) ×3 IMPLANT
ELECT BLADE 4.0 EZ CLEAN MEGAD (MISCELLANEOUS) ×3
ELECT BLADE 6.5 EXT (BLADE) ×3 IMPLANT
ELECT CAUTERY BLADE 6.4 (BLADE) ×3 IMPLANT
ELECT REM PT RETURN 9FT ADLT (ELECTROSURGICAL) ×3
ELECTRODE BLDE 4.0 EZ CLN MEGD (MISCELLANEOUS) ×1 IMPLANT
ELECTRODE REM PT RTRN 9FT ADLT (ELECTROSURGICAL) ×1 IMPLANT
EVACUATOR 1/8 PVC DRAIN (DRAIN) IMPLANT
GAUZE SPONGE 4X4 12PLY STRL (GAUZE/BANDAGES/DRESSINGS) ×3 IMPLANT
GLOVE BIOGEL PI IND STRL 8 (GLOVE) ×1 IMPLANT
GLOVE BIOGEL PI INDICATOR 8 (GLOVE) ×2
GLOVE ECLIPSE 9.0 STRL (GLOVE) ×3 IMPLANT
GLOVE ORTHO TXT STRL SZ7.5 (GLOVE) ×3 IMPLANT
GLOVE SURG 8.5 LATEX PF (GLOVE) ×3 IMPLANT
GOWN STRL REUS W/ TWL LRG LVL3 (GOWN DISPOSABLE) ×1 IMPLANT
GOWN STRL REUS W/TWL 2XL LVL3 (GOWN DISPOSABLE) ×6 IMPLANT
GOWN STRL REUS W/TWL LRG LVL3 (GOWN DISPOSABLE) ×2
GRAFT DURAGEN MATRIX 1WX1L (Tissue) ×3 IMPLANT
KIT BASIN OR (CUSTOM PROCEDURE TRAY) ×3 IMPLANT
KIT POSITION SURG JACKSON T1 (MISCELLANEOUS) ×3 IMPLANT
KIT TURNOVER KIT B (KITS) ×3 IMPLANT
NEEDLE 22X1 1/2 (OR ONLY) (NEEDLE) ×3 IMPLANT
NEEDLE SPNL 18GX3.5 QUINCKE PK (NEEDLE) ×3 IMPLANT
NS IRRIG 1000ML POUR BTL (IV SOLUTION) ×3 IMPLANT
PACK LAMINECTOMY ORTHO (CUSTOM PROCEDURE TRAY) ×3 IMPLANT
PAD ARMBOARD 7.5X6 YLW CONV (MISCELLANEOUS) ×6 IMPLANT
PATTIES SURGICAL .5 X.5 (GAUZE/BANDAGES/DRESSINGS) ×3 IMPLANT
PATTIES SURGICAL .75X.75 (GAUZE/BANDAGES/DRESSINGS) ×3 IMPLANT
PATTIES SURGICAL 1X1 (DISPOSABLE) ×3 IMPLANT
ROD PRE BENT EXP 40MM (Rod) ×3 IMPLANT
ROD PRE LORDOSED 5.5X45 (Rod) ×3 IMPLANT
SCREW SET SINGLE INNER (Screw) ×12 IMPLANT
SCREW VIPER 7X45MM (Screw) ×3 IMPLANT
SCREW VIPER 7X50MM (Screw) ×9 IMPLANT
SPACER CONCORDE PRO 9X11X27 (Spacer) ×3 IMPLANT
SPOGE SURGIFLO 8M (HEMOSTASIS) ×2
SPONGE LAP 4X18 RFD (DISPOSABLE) ×6 IMPLANT
SPONGE SURGIFLO 8M (HEMOSTASIS) ×1 IMPLANT
SPONGE SURGIFOAM ABS GEL 100 (HEMOSTASIS) ×3 IMPLANT
STAPLER VISISTAT 35W (STAPLE) ×3 IMPLANT
SUT NURALON 4 0 TR CR/8 (SUTURE) ×3 IMPLANT
SUT VIC AB 0 CT1 27 (SUTURE) ×2
SUT VIC AB 0 CT1 27XBRD ANBCTR (SUTURE) ×1 IMPLANT
SUT VIC AB 1 CTX 36 (SUTURE) ×4
SUT VIC AB 1 CTX36XBRD ANBCTR (SUTURE) ×2 IMPLANT
SUT VIC AB 2-0 CT1 27 (SUTURE) ×2
SUT VIC AB 2-0 CT1 TAPERPNT 27 (SUTURE) ×1 IMPLANT
SUT VIC AB 3-0 X1 27 (SUTURE) ×3 IMPLANT
SYR 20CC LL (SYRINGE) ×3 IMPLANT
SYR CONTROL 10ML LL (SYRINGE) ×9 IMPLANT
TAP CANN VIPER2 DL 5.0 (TAP) ×3 IMPLANT
TAP CANN VIPER2 DL 6.0 (TAP) ×3 IMPLANT
TAP CANN VIPER2 DL 7.0 (TAP) ×3 IMPLANT
TOWEL GREEN STERILE (TOWEL DISPOSABLE) ×3 IMPLANT
TOWEL GREEN STERILE FF (TOWEL DISPOSABLE) ×3 IMPLANT
TRAY FOLEY MTR SLVR 16FR STAT (SET/KITS/TRAYS/PACK) ×3 IMPLANT
WATER STERILE IRR 1000ML POUR (IV SOLUTION) IMPLANT
YANKAUER SUCT BULB TIP NO VENT (SUCTIONS) ×3 IMPLANT

## 2018-06-06 NOTE — Brief Op Note (Addendum)
06/06/2018  2:06 PM  PATIENT:  Cheryl Summers  77 y.o. female  PRE-OPERATIVE DIAGNOSIS:  L2-3 to L5-S1 spinal stenosis, L4-5 lumbar spondylolisthesis  POST-OPERATIVE DIAGNOSIS:  L2-3 to L5-S1 spinal stenosis, L4-5 lumbar spondylolisthesis  PROCEDURE:  Procedure(s): CENTRAL LAMINECTOMY L2-3, L3-4, L4-5, BILATERAL PARTIAL HEMILAMINECTOMY L5-S1, RIGHT L4-5 TRANSFORAMINAL LUMBAR INTERBODY FUSION WITH CAGE, MPACT SCREWS AND RODS, VIVIGEN, LOCAL AND ALLOGRAFT BONE GRAFT (N/A)  SURGEON:  Surgeon(s) and Role:    * Jessy Oto, MD - Primary  PHYSICIAN ASSISTANT: Benjiman Core, PA-C  ANESTHESIA:   local and general, Dr. Lanetta Inch.  EBL:  650 mL   BLOOD ADMINISTERED:225 CC CELLSAVER   COMPLICATIONS:  A dural central area of spread of the fibers noted at the superior base of L5 spinous process this was likely due to removal of the superior arch of the superior lamina Of L5 but may also relate to attrition from previous ESI. In either case a repair was necessary and carried out without difficulty, no neural element involvement, the length of the spread of the Dural fiber was 68mm and spread was about 25mm. Repair with interrupted 4-0 neurolon x 4 sutures valsalva then fibrin accuseal glue and duragen.     DRAINS: Urinary Catheter (Foley)   LOCAL MEDICATIONS USED:  MARCAINE 0.5% 1:1EXPAREL 1.3% Amount: 30 ml  SPECIMEN:  No Specimen  DISPOSITION OF SPECIMEN:  N/A  COUNTS:  YES  TOURNIQUET:  * No tourniquets in log *  DICTATION: .Dragon Dictation  PLAN OF CARE: Admit to inpatient   PATIENT DISPOSITION:  PACU - hemodynamically stable.   Delay start of Pharmacological VTE agent (>24hrs) due to surgical blood loss or risk of bleeding: yes

## 2018-06-06 NOTE — Transfer of Care (Signed)
Immediate Anesthesia Transfer of Care Note  Patient: Cheryl Summers  Procedure(s) Performed: CENTRAL LAMINECTOMY L2-3, L3-4, L4-5, BILATERAL PARTIAL HEMILAMINECTOMY L5-S1, RIGHT L4-5 TRANSFORAMINAL LUMBAR INTERBODY FUSION WITH CAGE, MPACT SCREWS AND RODS, VIVIGEN, LOCAL AND ALLOGRAFT BONE GRAFT (N/A )  Patient Location: PACU  Anesthesia Type:General  Level of Consciousness: awake and drowsy  Airway & Oxygen Therapy: Patient Spontanous Breathing and Patient connected to face mask oxygen  Post-op Assessment: Report given to RN and Post -op Vital signs reviewed and stable  Post vital signs: Reviewed and stable  Last Vitals:  Vitals Value Taken Time  BP 116/53 06/06/2018  2:28 PM  Temp    Pulse 84 06/06/2018  2:33 PM  Resp 17 06/06/2018  2:33 PM  SpO2 100 % 06/06/2018  2:33 PM  Vitals shown include unvalidated device data.  Last Pain:  Vitals:   06/06/18 0556  TempSrc: Oral         Complications: No apparent anesthesia complications

## 2018-06-06 NOTE — Anesthesia Procedure Notes (Signed)
Procedure Name: Intubation Date/Time: 06/06/2018 7:39 AM Performed by: Gwyndolyn Saxon, CRNA Pre-anesthesia Checklist: Patient identified, Emergency Drugs available, Suction available and Patient being monitored Patient Re-evaluated:Patient Re-evaluated prior to induction Oxygen Delivery Method: Circle system utilized Preoxygenation: Pre-oxygenation with 100% oxygen Induction Type: IV induction Ventilation: Mask ventilation without difficulty and Oral airway inserted - appropriate to patient size Laryngoscope Size: Sabra Heck and 2 Grade View: Grade I Tube type: Oral Tube size: 7.0 mm Number of attempts: 1 Placement Confirmation: ETT inserted through vocal cords under direct vision,  positive ETCO2,  CO2 detector and breath sounds checked- equal and bilateral Secured at: 20 cm Tube secured with: Tape Dental Injury: Teeth and Oropharynx as per pre-operative assessment

## 2018-06-06 NOTE — Op Note (Signed)
06/06/2018  4:37 PM  PATIENT:  Cheryl Summers  77 y.o. 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 Dr.Woodrum.   EBL: 650CC  CELL SAVER BLOOD RETURNED: 225CC.    COMPLICATIONS:  A dural central area of spread of the fibers noted at the superior base of L5 spinous process this was likely due to removal of the superior arch of the superior lamina Of L5 but may also relate to attrition from previous ESI. In either case a repair was necessary and carried out without difficulty, no neural element involvement, the length of the spread of the Dural fiber was 63m and spread was about 312m Repair with interrupted 4-0 neurolon x 4 sutures valsalva then fibrin accuseal glue and duragen.     COMPONENTS  Implant Name Type Inv. Item Serial No. Manufacturer Lot No. LRB No. Used  GRAFT DURAGEN MATRIX [id number] - [id number] GRAFT DURAGEN MATRIX [id number]  [employer] [id number]  BONE VIVIGEN FORMABLE 5.4CC - [id number] Implant BONE VIVIGEN FORMABLE 5.4CC [id number] [place] [facility]  N/A 1  SPACER CONCORDE PRO [id number] - [id number] SPACER CONCORDE PRO [id number]  [employer] [id number] N/A 1  SCREW VIPER 7X50MM - [id number] SCREW VIPER 7X50MM  [employer]  N/A 3  ROD PRE  LORDOSED 5.5X45 - [id number] ROD PRE LORDOSED 5.5X45  [employer]  N/A 1  SCREW VIPER 7X45MM - [id number] SCREW VIPER 7X45MM  [employer]  N/A 1  ROD PRE BENT EXP 40MM - [id number] ROD PRE BENT EXP 40MM  [employer]  N/A 1  SCREW SET SINGLE INNER - [id number] SCREW SET SINGLE INNER  [employer]  N/A 4  :   PROCEDURE: The patient was met in the holding area, and the appropriate lumbar levels right L4-5 TLIF and L2-3 to L5-S1 laminectomies, central at L2-3, L3-4 and L4-5 and bilateral partial hemilaminectomies L5-S1 identified and marked with an "X" and my initials. I had discussion with the patient in the preop holding area regarding a change of consent form.The fusion levels are reidentified as L4-5 . Patient understands the rationale to perform TLIFs at one level to decompress the L4-5 central stenosis and to prevent recurrence of stenosis due to spondylolisthesis.. The patient was then transported to OR and was placed under general anestheticwithout difficulty. The patient received appropriate preoperative antibiotic prophylaxis 2 gm Ancef.  Nursing staff inserted a Foley catheter under sterile conditions. The patient was then turned to a prone position using the [place] frame. PAS. all pressure points well padded the arms at the side to 90 90. Standard prep with DuraPrep solution draped in the usual manner from the lower dorsal spine the mid sacral segment. Iodine Vi-Drape was used and the old incision scar was marked. Time-out procedure was  called and correct. Skin in the midline between L2 and S2 was then infiltrated with local anesthesia, marcaine 1/2% 1:1 exparel 1.3% total 20 cc used. Incision was then made  extending from L2-S2  through the skin and subcutaneous layers down to the patient's lumbodorsal fascia and spinous processes. The incision then carried sharply excising the supraspinous ligament and then continuing  the lateral aspect of the spinous processes of L2, L3, L4, L5 and S1. Cobb elevator used to carefully elevate the paralumbar muscles off of the posterior elements using electrocautery carefully drilled bleeding and perform dissection of the muscle tissues of the preserving the facet capsule at the L2-3, L3-4 and L5-S1 levels. Continuing the exposure out laterally to expose the lateral margin of the facet joint line at L3-4 and L4-5. Incision was carried in the midline down to the S1 level area bleeders controlled using electrocautery monopolar electrocautery.  Clamp were then placed at the L4-5 interspinous process level observed on lateral radiograph the clamp was beneath the L4 spinous process. Spinous processes of L4-L5 marked with  OR marking pen sterilely the Insight self retaining retractor was used for the L4-5 part of the incision and the Boss McCoullough distally and for proximal exposure during the laminectomy portions of the case.  C-arm fluoroscopy was then brought into the field and using C-arm fluoroscopy then a hole made into the medial aspect of the pedicle of L4 observed in the pedicle using C arm at the 5 oclock position on the left L4 pedicle nerve probe initial entry was determined on fluoroscopy to be good position alignment so that a 4.35 mm tap was passed to 50 mm within the left L4 pedicle to a depth of nearly 50 mm observed on C-arm fluoroscopy to be beyond the midpoint of the lumbar vertebra and then position alignment within the left L4 pedicle this was then removed and the pedicle channel probed demonstrating patency no sign of rupture the cortex of the pedicle. Tapping with a 4 mm screw tap then 5 mm tap then a 6.55m tap and then a 7.046mtap, a 7.0 mm x 50 mm screw was placed on the left side at the L4 level. C-arm fluoroscopy was then brought into the field and using C-arm fluoroscopy then a hole made into the posterior medial aspect of the pedicle of right L4 observed in the pedicle  using ball tipped nerve hook and hockey stick nerve probe initial entry was determined on fluoroscopy to be good position alignment so that 4.79m3map was then used to tap the right L4 pedicle to a depth of nearly 40 mm observed on C-arm fluoroscopy to be beyond the midpoint of the lumbar vertebra and then position alignment within the right L4 pedicle this was then removed and the pedicle channel probed demonstrating patency no sign of rupture the cortex of the pedicle. Tapping with a 5 mm screw tap  then a 6.79mm979mp and then a 7.79mm 58m, a 7.0 mm x 50 mm screw was chosen to be placed later following decompression and TLIF. C-arm fluoroscopy was then brought into the field and using C-arm fluoroscopy then a hole made into the posterior and medial aspect of the left pedicle of L5 observed in the pedicle using ball tipped nerve hook and hockey stick nerve probe initial entry was determined on fluoroscopy to be good position alignment so that a 4.35mm 28mwas then used to tap the left L5 pedicle to a depth of nearly 30 mm observed on  C-arm fluoroscopy to be beyond the posterior one third of the lumbar vertebra and good position alignment within the left L5 pedicle this was then removed and the pedicle channel probed demonstrating patency no sign of rupture the cortex of the pedicle. Tapping with a 5 mm screw tap then a 6.39m tap and then a 7.028mtap, a 7.0 mm x 45 mm screw  was placed on the left side at the L5 level. The pedicle channel of L5 on the left probed demonstrating patency no sign of rupture the cortex of the pedicle. Viper screw for fixation of this level was measured as 7.0 mm x 45 mm screw so  was placed on the left side at the L5 level. C-arm fluoroscopy was then brought into the field and using C-arm fluoroscopy then a hole made into the posterior and medial aspect of the right pedicle of L5 observed in the pedicle using ball tipped nerve hook and hockey stick nerve probe initial entry was determined on  fluoroscopy to be good position alignment so that a 4.35m635map was then used to tap the left L5 pedicle to a depth of nearly 30 mm observed on C-arm fluoroscopy to be beyond the posterior one third of the lumbar vertebra and good position alignment within the right L5 pedicle this was then removed and the pedicle channel probed demonstrating patency no sign of rupture the cortex of the pedicle. Tapping with a 5 mm  then a 6.35mm29mp and then a 7.35mm 53m, a 7.0 mm x 45 mm screw for fixation of this level was measured and left for insertion after decompression and TLIF. A Boss McCollough retractor then placed at the L5-S1 level and the bilateral L5-S1 posterior interlaminar area exposed. Ligamentum flavum was further debrided superiorly to the level L5-S1 disc with partial resection of the L5 lamina inferiorly bilaterally performed. With this then the disc space at L5-S1 was easily visualized and not herniated. Ligamentum flavum was debrided and lateral recess along the medial aspect of both L5-S1 facets was performed with osteotome and then kerrisons. A Hockey stick nerve probe was then able to carefully palpate the neuroforamen for L5 and S1 finding these to be well decompressed. Attention then turned to the L2-3 to L4-5 levels which where then exposed with the Boss retractor. The L2-3 to L4-5 levels were then exposed using Cobb elevators and electrocautery. These areas were then packed. Boss McCollough retractor was inserted at the upper incision site. Leksell rongeur used to remove a portion of the inferior aspect is process of L2 50% and the entire spinous processes of L3 and L4. Leksell rongeur was then used to further remove bone down to the ligamentum flavum and the central portions of the lamina and base of the spinous process were thinned.Leskell rounger was also used to thin the lamina of L3 and L4 centrally. The facets were then exposed out laterally and were hypertrophic.Osteotomes then used medial  aspect of the L3-4 and L4-5 facets and L2-3 facet bilaterally resecting approximately 10-15% of the facet bilaterally. Kerrisons were then used to resect central portions of the superior lamina of L4 and the entire L3 lamina. Ligamentum flavum at the L2-3 L3-4 and L4-5 levels were then carefully resected centrally preserving at least 7-8 mm with at the pars level L2 and L3. The central laminectomy was further widened performing more resection of the medial aspect of facet at both L2-3 and L3-4. Note that loupe magnification and headlight was used for this portion procedure.the  central portions of the ligamentum flavum at the L2-3 level were resected and the medial aspect of the L2-3 facet resected over about 5-10%. Hypertrophic flava was found to be present. The operating room microscope sterilely draped brought into the field and then carefully the right side decompressed at each level beginning at the right L5 neuroforamen resect bone over the superior lamina of L5 and decompressing the right L5 foramen and reflected portion ligamentum flavum off the medial aspect of the right L3-4 facet. Hockey-stick nerve probe could be passed out both the L4 and L5 neuroforamen. The medial aspect of facet at the L3-4 level was then carefully evaluated and hypertrophic ligamentum flavum resected using Kerrisons decompressing the lateral recess on this right side. This was done such that hockey-stick nerve probe could be used to pass the outer recess demonstrating patency and decompression of the L4 nerve root and L5 nerve roots. Attention then turned to the L2-3 were further debridement of the lateral recess and hypertrophic ligamentum flavum and reflected portions of ligamentum flavum were carried out. Changing sides of the OR table in decompression was carried out in similar fashion on the left side from the left L5 nerve root to the L2-3 level. At the left L3-4 level severe lateral recess stenosis was noted lateral recess was  decompressed and the L4 nerve root freed up.  Further decompression was then carried out the left side of the spinal canal decompressing the L 4, the L3 and the L2 and of of a nerve roots. Following this then a hockey-stick nerve probe could be passed out easily bilateral L2-L3 and L4 neuroforamen. A central spit in the fibers of the dura was found under the OR microscope just at the superior margin of the base of the L5 spinous process. There was intact subarchnoid layer and not visible leak. Still the weakness of the posterior thecal sac in this Area was likely to result in an active leak without repair so that a dural repair was carried out using interrupted 4-0 Neurolon  Sutures total of 4 were placed. Then at the end of the case prior to closure fibrin glue accuseal and duragen over the glue was applied following valsalva to 30 mm Hg demonstrating no active leak. The left medial 40% of the facet of L4-5 was resected in order to decompress the left side of the lumbar thecal sac at  L4-5 and decompress the left L4 and L5 neuroforamen. Osteotomes and 75m and 333mkerrisons were used for this portion of the decompression. Similarly the right side decompression was carried out but complete facetectomies were perform on the right at L4-5 to provide for exposure of the right side L4-5 neuroforamen for ease of placement of TLIF (transforaminal lumbar interbody fusion) at this level,  inferior portions of the lamina and pars were also resected first beginning with the Leksell rongeur and osteotomes and then resecting using 2 and 3 mm Kerrison. Continued laminectomy was carried out resecting the central portions of the lamina of L4,  performing foraminotomies on the right side at the L4 and L5 levels. The inferior articular process L4 and superior articular process of L5 were resected on the right side. The L5 nerve root identified bilaterally and the medial aspect of the L5 pedicle for placement of cage. A large  amount of hypertrophic ligmentum flavum was found impressing on the right lateral recesses at L4-5 and narrowing the respective L4 and L5 neuroforamen. Steriley draped OR microscope used during this portion procedure.  Attention then turned to placement of the transforaminal lumbar interbody fusion cages. Using a Penfield 4 the right lateral aspect of the thecal sac at the L4-5 disc space was carefully freed up The thecal sac could then easily be retracted in the posterior lateral aspect of the L4-5 disc was exposed 15 blade scalpel used to incise the posterolateral disc and an osteotome used to resect a small portion of bone off the superior aspect of the posterior superior vertebral body of L5 in order to ease the entry into the L4-5 disc space. A  33m kerrison rongeur was then able to be introduced in the disc space debrided it was quite narrow. 7 mm dilator shaver was used to dialate the L4-5 disc space on the right side attempts were made to dilate further to 11 mm were successful and using small curettes and the disc space was debrided a minimal degenerative disc present in the endplates debrided to bleeding endplate bone. Shavers were inserted to trial the intervertebral disc space. A 11 mm x 215mProTi 5 degree lordotic Concorde cage was carefully packed with morcellized bone graft and the been harvested from previous laminotomies.The cage was then inserted with the articulating insertion handle.  Additional bone graft was then packed into the intervertebral disc space. Bleeding controlled using bipolar electrocautery thrombin soaked gel cottonoids.With this then the transforaminal lumbar interbody fusion portion of the case was completed bleeders were controlled using bipolar electrocautery thrombin-soaked Gelfoam were appropriate.Decortication of the facet joints carried out bilateral L4-5. These were packed with cancellous local bone graft. 2 pedicle screws on the left already in place, the 2 viper  corticofixation screws on the right at L4 and L5 were each placed and then each fastener carefully aligned  to allow for placement of rods. The right side first quarter inch titanium rod was then carefully contoured using the french benders and a precontoured [id number]. This was then placed into the pedicle screws on the right extending from L4-L5 each of the caps were carefully placed loosely tightened. Attention turned to the left side were similarly and then screws were carefully adjusted to allow for a better pattern screws to allow for placement of fixation of the rod a quarter inch 40 mm precontoured titanium rod was then carefully contoured. This was able to be inserted into the left pedicle screw fasteners, Caps onto the L4 fasteners were tightened to 80 foot lbs. Across the right side  L4-5 screw fasteners compression was obtained on the right side between L4 and L5 by compressing between the fasteners and tightening the screw caps 85 pounds. Similarly this was done on the left side at L4-5 obtaining compression and tightened 85 pounds. Irrigation was carried out with copious amounts of saline solution this was done throughout the case. Cell Saver was used during the case. irrigation was carried out. Hockey stick neuroprobe was used to probe the neuroforamen bilateral L3,L4, L5 and S1, these were determined to be well decompressed. Permanent C-arm images were obtained in AP and lateral plane and oblique planes. Remaining local bone graft was then applied along both lateral posterior lateral region extending from L4 to L5 facet beds. At this time Gelfoam was then removed spinal canal, with the L4-5 level hemostased fibrin glue accuseal and duragen over the glue was applied to the area of dura repair following valsalva to 30 mm Hg demonstrating no active leak. The lumbodorsal musculature carefully exam debrided of any devitalized tissue following removal of Viper retractors were the  bleeders were controlled  using electrocautery and the area dorsal lumbar muscle were then approximated in the midline with interrupted #1 Vicryl sutures loose the dorsal fascia was reattached to the spinous process of L2 to superiorly and L5-S1 inferiorly this was done with #1 Vicryl sutures. Subcutaneous layers then approximated using interrupted 0 Vicryl sutures and 2-0 Vicryl sutures. Skin was closed with stainless steel staples then MedPlex bandage applied. All instrument and sponge counts were correct. The patient was then returned to a supine position on her bed reactivated extubated and returned to the recovery room in satisfactory condition.   Benjiman Core, PA-C perform the duties of assistant surgeon during this case. He was present from the beginning of the case to the end of the case assisting in transfer the patient from his stretcher to the OR table and back to the stretcher at the end of the case. Assisted in careful retraction and suction of the laminectomy site delicate neural structures operating under the operating room microscope. He performed closure of the incision from the fascia to the skin applying the dressing.         [name]  06/06/2018, 4:37 PM

## 2018-06-06 NOTE — Discharge Instructions (Signed)

## 2018-06-06 NOTE — Anesthesia Procedure Notes (Signed)
Arterial Line Insertion Start/End8/13/2019 7:40 AM, 06/06/2018 7:45 AM Performed by: Gwyndolyn Saxon, CRNA, CRNA  Patient location: OR. Preanesthetic checklist: patient identified, IV checked, site marked, risks and benefits discussed, surgical consent, monitors and equipment checked, pre-op evaluation, timeout performed and anesthesia consent Lidocaine 1% used for infiltration Left, radial was placed Catheter size: 20 G Hand hygiene performed , maximum sterile barriers used  and Seldinger technique used Allen's test indicative of satisfactory collateral circulation Attempts: 1 Procedure performed without using ultrasound guided technique. Following insertion, dressing applied and Biopatch. Post procedure assessment: normal  Patient tolerated the procedure well with no immediate complications.

## 2018-06-06 NOTE — Anesthesia Postprocedure Evaluation (Signed)
Anesthesia Post Note  Patient: Cheryl Summers  Procedure(s) Performed: CENTRAL LAMINECTOMY L2-3, L3-4, L4-5, BILATERAL PARTIAL HEMILAMINECTOMY L5-S1, RIGHT L4-5 TRANSFORAMINAL LUMBAR INTERBODY FUSION WITH CAGE, MPACT SCREWS AND RODS, VIVIGEN, LOCAL AND ALLOGRAFT BONE GRAFT (N/A )     Patient location during evaluation: PACU Anesthesia Type: General Level of consciousness: awake and alert Pain management: pain level controlled Vital Signs Assessment: post-procedure vital signs reviewed and stable Respiratory status: spontaneous breathing, nonlabored ventilation, respiratory function stable and patient connected to nasal cannula oxygen Cardiovascular status: blood pressure returned to baseline and stable Postop Assessment: no apparent nausea or vomiting Anesthetic complications: no    Last Vitals:  Vitals:   06/06/18 1515 06/06/18 1530  BP: 113/71 112/75  Pulse: 80 84  Resp: 15 20  Temp:    SpO2: 100% 100%    Last Pain:  Vitals:   06/06/18 1530  TempSrc:   PainSc: 0-No pain        RLE Motor Response: Purposeful movement;Responds to commands (06/06/18 1530)        [place]

## 2018-06-07 ENCOUNTER — Inpatient Hospital Stay (HOSPITAL_COMMUNITY): Payer: PPO

## 2018-06-07 ENCOUNTER — Other Ambulatory Visit: Payer: Self-pay

## 2018-06-07 DIAGNOSIS — R278 Other lack of coordination: Secondary | ICD-10-CM

## 2018-06-07 DIAGNOSIS — E1142 Type 2 diabetes mellitus with diabetic polyneuropathy: Secondary | ICD-10-CM

## 2018-06-07 DIAGNOSIS — I959 Hypotension, unspecified: Secondary | ICD-10-CM

## 2018-06-07 DIAGNOSIS — N289 Disorder of kidney and ureter, unspecified: Secondary | ICD-10-CM

## 2018-06-07 LAB — CBC WITH DIFFERENTIAL/PLATELET
Abs Immature Granulocytes: 0.1 10*3/uL (ref 0.0–0.1)
Basophils Absolute: 0 10*3/uL (ref 0.0–0.1)
Basophils Relative: 0 %
EOS PCT: 0 %
Eosinophils Absolute: 0 10*3/uL (ref 0.0–0.7)
HEMATOCRIT: 29.3 % — AB (ref 36.0–46.0)
HEMOGLOBIN: 8.9 g/dL — AB (ref 12.0–15.0)
Immature Granulocytes: 1 %
LYMPHS ABS: 1.5 10*3/uL (ref 0.7–4.0)
LYMPHS PCT: 11 %
MCH: 29.8 pg (ref 26.0–34.0)
MCHC: 30.4 g/dL (ref 30.0–36.0)
MCV: 98 fL (ref 78.0–100.0)
Monocytes Absolute: 1.6 10*3/uL — ABNORMAL HIGH (ref 0.1–1.0)
Monocytes Relative: 12 %
Neutro Abs: 10.8 10*3/uL — ABNORMAL HIGH (ref 1.7–7.7)
Neutrophils Relative %: 76 %
Platelets: 175 10*3/uL (ref 150–400)
RBC: 2.99 MIL/uL — AB (ref 3.87–5.11)
RDW: 14.6 % (ref 11.5–15.5)
WBC: 14.1 10*3/uL — AB (ref 4.0–10.5)

## 2018-06-07 LAB — BASIC METABOLIC PANEL
ANION GAP: 8 (ref 5–15)
Anion gap: 10 (ref 5–15)
BUN: 28 mg/dL — ABNORMAL HIGH (ref 8–23)
BUN: 28 mg/dL — ABNORMAL HIGH (ref 8–23)
CALCIUM: 8.2 mg/dL — AB (ref 8.9–10.3)
CHLORIDE: 110 mmol/L (ref 98–111)
CO2: 20 mmol/L — AB (ref 22–32)
CO2: 22 mmol/L (ref 22–32)
CREATININE: 1.78 mg/dL — AB (ref 0.44–1.00)
CREATININE: 1.71 mg/dL — AB (ref 0.44–1.00)
Calcium: 8.5 mg/dL — ABNORMAL LOW (ref 8.9–10.3)
Chloride: 111 mmol/L (ref 98–111)
GFR calc non Af Amer: 26 mL/min — ABNORMAL LOW (ref >60)
GFR calc non Af Amer: 27 mL/min — ABNORMAL LOW (ref >60)
GFR, EST AFRICAN AMERICAN: 30 mL/min — AB (ref >60)
GFR, EST AFRICAN AMERICAN: 32 mL/min — AB (ref >60)
Glucose, Bld: 129 mg/dL — ABNORMAL HIGH (ref 70–99)
Glucose, Bld: 122 mg/dL — ABNORMAL HIGH (ref 70–99)
Potassium: 5.5 mmol/L — ABNORMAL HIGH (ref 3.5–5.1)
Potassium: 4.7 mmol/L (ref 3.5–5.1)
Sodium: 141 mmol/L (ref 135–145)
Sodium: 140 mmol/L (ref 135–145)

## 2018-06-07 LAB — CBC
HCT: 28.3 % — ABNORMAL LOW (ref 36.0–46.0)
Hemoglobin: 8.4 g/dL — ABNORMAL LOW (ref 12.0–15.0)
MCH: 29.7 pg (ref 26.0–34.0)
MCHC: 29.7 g/dL — AB (ref 30.0–36.0)
MCV: 100 fL (ref 78.0–100.0)
Platelets: 185 10*3/uL (ref 150–400)
RBC: 2.83 MIL/uL — ABNORMAL LOW (ref 3.87–5.11)
RDW: 14.6 % (ref 11.5–15.5)
WBC: 12.7 10*3/uL — ABNORMAL HIGH (ref 4.0–10.5)

## 2018-06-07 LAB — GLUCOSE, CAPILLARY
GLUCOSE-CAPILLARY: 109 mg/dL — AB (ref 70–99)
Glucose-Capillary: 117 mg/dL — ABNORMAL HIGH (ref 70–99)
Glucose-Capillary: 110 mg/dL — ABNORMAL HIGH (ref 70–99)
Glucose-Capillary: 124 mg/dL — ABNORMAL HIGH (ref 70–99)

## 2018-06-07 MED ORDER — SODIUM CHLORIDE 0.9 % IV BOLUS
250.0000 mL | Freq: Once | INTRAVENOUS | Status: AC
  Administered 2018-06-07: 250 mL via INTRAVENOUS

## 2018-06-07 MED ORDER — SODIUM CHLORIDE 0.45 % IV BOLUS: 250.0000 mL | Freq: Once | INTRAVENOUS | Status: DC

## 2018-06-07 MED ORDER — SODIUM CHLORIDE 0.9 % IV BOLUS
500.0000 mL | Freq: Once | INTRAVENOUS | Status: AC
  Administered 2018-06-08: 500 mL via INTRAVENOUS

## 2018-06-07 MED FILL — Thrombin For Soln Kit 20000 Unit: CUTANEOUS | Qty: 1 | Status: AC

## 2018-06-07 NOTE — Progress Notes (Signed)
Pt stated that ted hose on her left leg was making her feet burn and I told her we could remove them for a little bit but we will keep the scd's on will continue to monitor

## 2018-06-07 NOTE — Evaluation (Signed)
Physical Therapy Evaluation Patient Details Name: Cheryl Summers MRN: [id number] DOB: 10-Aug-1941 Today's Date: 06/07/2018   History of Present Illness  Patient is a 77 y/o female s/p CENTRAL LAMINECTOMY L2-3, L3-4, L4-5, BILATERAL PARTIAL HEMILAMINECTOMY L5-S1, RIGHT L4-5 TRANSFORAMINAL LUMBAR INTERBODY FUSION WITH CAGE, MPACT SCREWS AND RODS, VIVIGEN, LOCAL AND ALLOGRAFT BONE GRAFT on 06/06/18. PMH significant for anxiety, COPD, depression, HTN, PAF.    Clinical Impression  Ms. Bojarski is a very pleasant 77 y/o admitted s/p above procedure. Patients son present and very supportive during session. Patient reports that prior to admission she ambulated with [facility], but was limited due to pain. Patient today educated on back precautions with handout given as well as educated on log rolling technique. Patient requiring general Min A for all bed mobility, transfers, and gait with gait limited due to safety concerns for patient due to reduced coordination of R UE. Patient and son stating that they plan to d/c to [facility] in [place]. PT to follow acutely to maximize safe functional mobility prior to d/c.    Follow Up Recommendations Follow surgeon's recommendation for DC plan and follow-up therapies    Equipment Recommendations  Rolling walker with 5" wheels    Recommendations for Other Services OT consult     Precautions / Restrictions Precautions Precautions: Back;Fall Precaution Booklet Issued: Yes (comment) Precaution Comments: reviewed 3/3 back precautions and handout Required Braces or Orthoses: Spinal Brace Spinal Brace: Lumbar corset;Applied in sitting position Restrictions Weight Bearing Restrictions: No      Mobility  Bed Mobility Overal bed mobility: Needs Assistance Bed Mobility: Rolling;Sidelying to Sit Rolling: Min assist Sidelying to sit: Mod assist;Min assist       General bed mobility comments: assist for log rolling and trunk control to come to  sitting  Transfers Overall transfer level: Needs assistance Equipment used: Rolling walker (2 wheeled) Transfers: Sit to/from Stand Sit to Stand: Min assist         General transfer comment: Min A to power up from bedside; heavy verbal cueing for hand placement  Ambulation/Gait Ambulation/Gait assistance: Min assist Gait Distance (Feet): 5 Feet Assistive device: Rolling walker (2 wheeled) Gait Pattern/deviations: Step-to pattern;Decreased stride length Gait velocity: decreased   General Gait Details: short ambulation distance in room; Min A for forward progression and for RW management; safety concern due to R UE Spasticity?  Stairs            Wheelchair Mobility    Modified Rankin (Stroke Patients Only)       Balance Overall balance assessment: Needs assistance Sitting-balance support: Single extremity supported;Feet supported Sitting balance-Leahy Scale: Fair     Standing balance support: Bilateral upper extremity supported;During functional activity Standing balance-Leahy Scale: Poor                               Pertinent Vitals/Pain Pain Assessment: Faces Faces Pain Scale: Hurts a little bit Pain Location: low back Pain Descriptors / Indicators: Sore Pain Intervention(s): Limited activity within patient's tolerance;Monitored during session;Repositioned    Home Living Family/patient expects to be discharged to:: Private residence Living Arrangements: Alone Available Help at Discharge: Family Type of Home: House Home Access: Ramped entrance     Home Layout: One level Home Equipment: Cane - single point;Grab bars - tub/shower      Prior Function Level of Independence: Independent with assistive device(s)         Comments: SPC; reports limited mobility due to  pain     Hand Dominance        Extremity/Trunk Assessment   Upper Extremity Assessment Upper Extremity Assessment: Defer to OT evaluation    Lower Extremity  Assessment Lower Extremity Assessment: Generalized weakness    Cervical / Trunk Assessment Cervical / Trunk Assessment: Kyphotic  Communication   Communication: No difficulties  Cognition Arousal/Alertness: Awake/alert Behavior During Therapy: WFL for tasks assessed/performed Overall Cognitive Status: Within Functional Limits for tasks assessed                                        General Comments      Exercises     Assessment/Plan    PT Assessment Patient needs continued PT services  PT Problem List Decreased strength;Decreased activity tolerance;Decreased balance;Decreased mobility;Decreased coordination;Decreased knowledge of use of DME;Decreased safety awareness       PT Treatment Interventions Gait training;DME instruction;Functional mobility training;Therapeutic activities;Therapeutic exercise;Balance training;Neuromuscular re-education;Patient/family education    PT Goals (Current goals can be found in the Care Plan section)  Acute Rehab PT Goals Patient Stated Goal: regain mobility - "I want to do everything I want to do." PT Goal Formulation: With patient Time For Goal Achievement: 06/21/18 Potential to Achieve Goals: Good    Frequency Min 5X/week   Barriers to discharge        Co-evaluation               AM-PAC PT "6 Clicks" Daily Activity  Outcome Measure Difficulty turning over in bed (including adjusting bedclothes, sheets and blankets)?: Unable Difficulty moving from lying on back to sitting on the side of the bed? : Unable Difficulty sitting down on and standing up from a chair with arms (e.g., wheelchair, bedside commode, etc,.)?: Unable Help needed moving to and from a bed to chair (including a wheelchair)?: A Little Help needed walking in hospital room?: A Little Help needed climbing 3-5 steps with a railing? : A Lot 6 Click Score: 11    End of Session Equipment Utilized During Treatment: Gait belt Activity Tolerance:  Patient tolerated treatment well Patient left: in chair;with call bell/phone within reach;with chair alarm set;with family/visitor present Nurse Communication: Mobility status PT Visit Diagnosis: Unsteadiness on feet (R26.81);Other abnormalities of gait and mobility (R26.89);Muscle weakness (generalized) (M62.81)    Time: 5102-5852 PT Time Calculation (min) (ACUTE ONLY): 35 min   Charges:   PT Evaluation $PT Eval Moderate Complexity: 1 Mod PT Treatments $Therapeutic Activity: 8-22 mins        Lanney Gins, PT, DPT 06/07/18 1:35 PM Pager: [phone]

## 2018-06-07 NOTE — Evaluation (Signed)
Occupational Therapy Evaluation Patient Details Name: Cheryl Summers MRN: [id number] DOB: 03-09-1941 Today's Date: 06/07/2018    History of Present Illness Patient is a 77 y/o female s/p CENTRAL LAMINECTOMY L2-3, L3-4, L4-5, BILATERAL PARTIAL HEMILAMINECTOMY L5-S1, RIGHT L4-5 TRANSFORAMINAL LUMBAR INTERBODY FUSION WITH CAGE, MPACT SCREWS AND RODS, VIVIGEN, LOCAL AND ALLOGRAFT BONE GRAFT on 06/06/18. PMH significant for anxiety, COPD, depression, HTN, PAF.   Clinical Impression   PTA, pt was living alone and was independent and enjoyed caring for her dogs. Pt currently requiring Mod-Max A for UB ADLs, Max A for LB ADLs, and Min A for functional mobility with RW. Pt presenting with fine/gross motor coordination deficits presenting with dyskinesia of right UE (dominant hand). Pt dyskinesia presenting at rest and during movement. Pt presenting with decreased ST memory and problem solving requiring cues throughout session. Pt also reporting a headache. Reviewing all back precautions with pt and daughter; pt unable to recall precaution education from beginning of session at end of session. Pt will require further acute OT to address ADLs and functional transfers. Pt planning for dc to [facility] [place] for post-acute rehab. Recommend dc to post-acute rehab and feel pt would benefit from intensive rehab at [facility] to reach Mod I level.    Follow Up Recommendations  SNF;Supervision/Assistance - 24 hour;Other (comment);CIR(Pt planning for [facility] in [place]. )    Equipment Recommendations  3 in 1 bedside commode;Other (comment)(Defer to next venue)    Recommendations for Other Services PT consult     Precautions / Restrictions Precautions Precautions: Back;Fall Precaution Booklet Issued: Yes (comment) Precaution Comments: reviewed 3/3 back precautions and handout Required Braces or Orthoses: Spinal Brace Spinal Brace: Lumbar corset;Applied in sitting position Restrictions Weight Bearing Restrictions: No       Mobility Bed Mobility Overal bed mobility: Needs Assistance Bed Mobility: Rolling;Sidelying to Sit;Sit to Sidelying Rolling: Min assist Sidelying to sit: Mod assist;Min assist     Sit to sidelying: Min assist General bed mobility comments: assist for log rolling and trunk control to come to sitting  Transfers Overall transfer level: Needs assistance Equipment used: Rolling walker (2 wheeled) Transfers: Sit to/from Stand Sit to Stand: Min assist         General transfer comment: Min A to power up from bedside; heavy verbal cueing for hand placement    Balance Overall balance assessment: Needs assistance Sitting-balance support: Single extremity supported;Feet supported Sitting balance-Leahy Scale: Fair     Standing balance support: Bilateral upper extremity supported;During functional activity Standing balance-Leahy Scale: Poor                             ADL either performed or assessed with clinical judgement   ADL Overall ADL's : Needs assistance/impaired Eating/Feeding: Minimal assistance;Sitting Eating/Feeding Details (indicate cue type and reason): Min A to assist with bilateral tasks due to decreased coorindation of RUE (dominant hand) Grooming: Minimal assistance;Sitting Grooming Details (indicate cue type and reason): Min A to assist with bilateral tasks due to decreased coorindation of RUE (dominant hand) Upper Body Bathing: Moderate assistance;Sitting   Lower Body Bathing: Maximal assistance;Sit to/from stand   Upper Body Dressing : Moderate assistance;Sitting Upper Body Dressing Details (indicate cue type and reason): Mod A for donning/doffing brace. Max cues for sequencing. Pt limited by decreased coorindation of RUE  Lower Body Dressing: Maximal assistance;Sit to/from stand Lower Body Dressing Details (indicate cue type and reason): Pt unable to bring ankles to knees Toilet Transfer: Minimal  assistance;Ambulation;RW(simulated to  recliner) Armed forces technical officer Details (indicate cue type and reason): Min A for power up into standing and then safe descent         Functional mobility during ADLs: Minimal assistance;Rolling walker General ADL Comments: Pt highly motivated     Vision Baseline Vision/History: Wears glasses Wears Glasses: At all times Patient Visual Report: No change from baseline Vision Assessment?: Yes Eye Alignment: Within Functional Limits Ocular Range of Motion: Within Functional Limits Tracking/Visual Pursuits: Requires cues, head turns, or add eye shifts to track     Perception     Praxis      Pertinent Vitals/Pain Pain Assessment: 0-10 Pain Score: 5  Pain Location: low back Pain Descriptors / Indicators: Sore Pain Intervention(s): Monitored during session;Limited activity within patient's tolerance;Repositioned;Premedicated before session     Hand Dominance Right   Extremity/Trunk Assessment Upper Extremity Assessment Upper Extremity Assessment: RUE deficits/detail RUE Deficits / Details: Decreased grasp strength. Pt with decreased fine/gross motor cooridnation and presenting with dyskinesia. Pt unable to perform finger opposition. During finger to nose test, pt overshooting when bringing her hand to her nose and hitting herself in the face. Pt undershooting when reaching out to touch OT finger. Pt presenting with decreased prioprioception and unable to report where her hand was in space when her eyes closed.  RUE Coordination: decreased fine motor;decreased gross motor   Lower Extremity Assessment Lower Extremity Assessment: Generalized weakness   Cervical / Trunk Assessment Cervical / Trunk Assessment: Kyphotic;Other exceptions Cervical / Trunk Exceptions: s/p lumbar sx   Communication Communication Communication: No difficulties   Cognition Arousal/Alertness: Awake/alert Behavior During Therapy: WFL for tasks assessed/performed Overall Cognitive Status: Impaired/Different  from baseline Area of Impairment: Memory;Following commands;Awareness;Problem solving                     Memory: Decreased short-term memory;Decreased recall of precautions Following Commands: Follows one step commands inconsistently;Follows one step commands with increased time   Awareness: Emergent Problem Solving: Difficulty sequencing;Requires verbal cues General Comments: Pt only able to recall no bending. Pt with decreased ST memory deficits and daughter reminding her several times throughout session that she was in surgery for "6 hours".  During finger to nose test, pt requiring max cues for follow cues and perform test accurately.    General Comments  Daughter present throughout session. Pt reporting head ache this morning and thorughout the day.     Exercises     Shoulder Instructions      Home Living Family/patient expects to be discharged to:: Private residence Living Arrangements: Alone Available Help at Discharge: Family Type of Home: House Home Access: [place]: One level     Bathroom Shower/Tub: Teacher, early years/pre: [place] - single point;Grab bars - tub/shower;Shower seat          Prior Functioning/Environment Level of Independence: Independent with assistive device(s)        Comments: SPC; reports limited mobility due to pain. ADLs, IADLs, and enjoys caring for her dogs        OT Problem List: Decreased strength;Decreased range of motion;Decreased activity tolerance;Impaired balance (sitting and/or standing);Decreased cognition;Decreased coordination;Decreased knowledge of use of DME or AE;Decreased knowledge of precautions;Pain;Impaired UE functional use      OT Treatment/Interventions: Self-care/ADL training;Therapeutic exercise;Energy conservation;DME and/or AE instruction;Therapeutic activities;Patient/family education    OT Goals(Current goals can be found in the care  plan section) Acute Rehab  OT Goals Patient Stated Goal: "Return to my dogs" OT Goal Formulation: With patient Time For Goal Achievement: 06/21/18 Potential to Achieve Goals: Good ADL Goals Pt Will Perform Upper Body Dressing: with set-up;with supervision;sitting Pt Will Perform Lower Body Dressing: with set-up;with supervision;sit to/from stand;with adaptive equipment Pt Will Transfer to Toilet: with set-up;with supervision;bedside commode;ambulating Pt Will Perform Toileting - Clothing Manipulation and hygiene: with set-up;with supervision;sit to/from stand Additional ADL Goal #1: Pt will perform bed mobility demonstrating log roll with supervision in preparation for ADLs  OT Frequency: Min 2X/week   Barriers to D/C:            Co-evaluation              AM-PAC PT "6 Clicks" Daily Activity     Outcome Measure Help from another person eating meals?: A Little Help from another person taking care of personal grooming?: A Little Help from another person toileting, which includes using toliet, bedpan, or urinal?: A Lot Help from another person bathing (including washing, rinsing, drying)?: A Lot Help from another person to put on and taking off regular upper body clothing?: A Lot Help from another person to put on and taking off regular lower body clothing?: A Lot 6 Click Score: 14   End of Session Equipment Utilized During Treatment: Rolling walker;Back brace Nurse Communication: Mobility status;Precautions;Other (comment)(Stroke-like symptoms)  Activity Tolerance: Patient tolerated treatment well Patient left: in bed;with call bell/phone within reach;with bed alarm set;with family/visitor present;with SCD's reapplied  OT Visit Diagnosis: Unsteadiness on feet (R26.81);Other abnormalities of gait and mobility (R26.89);Muscle weakness (generalized) (M62.81);Other symptoms and signs involving cognitive function;Pain;Apraxia (R48.2) Pain - part of body: (Back)                 Time: 8099-8338 OT Time Calculation (min): 40 min Charges:  OT General Charges $OT Visit: 1 Visit OT Evaluation $OT Eval Moderate Complexity: 1 Mod OT Treatments $Self Care/Home Management : 23-37 mins  Opp, OTR/L Acute Rehab Pager: [phone] Office: [place] 06/07/2018, 4:51 PM

## 2018-06-07 NOTE — Consult Note (Signed)
Medical Consultation   Cheryl Summers  CVE:[id number]  DOB: [birth year]  DOA: 06/06/2018  PCP: Nicholos Johns, MD   Outpatient Specialists: Dr. Bettina Gavia (cardiology), Dr. Scot Dock (vascular surgery)   Requesting physician: Dr. Louanne Skye (orthopedic surgery)   Reason for consultation: Hypotension    History of Present Illness: Cheryl Summers is an 77 y.o. female with history of PAF not anticoagulated due to fall risk, chronic diastolic CHF, CAD s/p remote CABG, hypertension, and lumbar spinal stenosis who was admitted to the hospital and taken to the OR yesterday evening for lumbar laminectomy with fusion and postoperative course complicated by hypotension.  She had been continued on her antihypertensives and diuretic following the surgery, but the antihypertensives were held this morning and she was given a 250 cc normal saline bolus for low blood pressure.  She was given a second 250 cc normal saline bolus this evening but systolic pressure remained in the upper 80s and hospitalist were asked to consult.  There had been 650 cc of blood loss during the surgery with 250 cc replaced from [employer].  Patient reports some moderate left hip pain, but denies chest pain, shortness of breath, lightheadedness, or headache.  She denies cough or dysuria, and denies fever or chills.   Review of Systems:  ROS As per HPI otherwise 10 point review of systems negative.   Past Medical History: Past Medical History:  Diagnosis Date  . Anxiety   . Carotid artery stenosis, asymptomatic    50% ASYMPTOMATIC  RIGHT CAROTID STENOSIS  . Chronic diastolic heart failure ([place]) 06/09/2015  . COPD (chronic obstructive pulmonary disease) ([place])   . Depression   . Diabetes mellitus without complication ([place])   . Dysrhythmia   . Gastroesophageal reflux   . Hyperlipidemia   . Hypertension   . Hypertensive heart disease with heart failure ([place]) 06/09/2015  . PAF (paroxysmal atrial fibrillation) ([place]) 06/09/2015    Overview:  CHADS2 vasc score=6  . Renal insufficiency   . Tobacco abuse   . Tremors of nervous system     Past Surgical History: Past Surgical History:  Procedure Laterality Date  . ABDOMINAL HYSTERECTOMY    . CORONARY ARTERY BYPASS GRAFT  2012  . NOSE SURGERY       Allergies:  No Known Allergies   Social History:  reports that she has quit smoking. She has never used smokeless tobacco. She reports that she does not drink alcohol or use drugs.   Family History: Family History  Problem Relation Age of Onset  . Heart attack Mother   . Hypertension Mother   . Hyperlipidemia Mother   . Heart attack Father   . Hypertension Father   . Hyperlipidemia Father   . Heart attack Sister   . Stroke Brother   . Heart attack Brother   . Hypertension Daughter     Physical Exam: Vitals:   06/07/18 0437 06/07/18 0836 06/07/18 1503 06/07/18 2022  BP: (!) 89/51 (!) 93/59 (!) 109/47 (!) 89/73  Pulse: 71  77 83  Resp: 16  18 18   Temp: 97.8 F (36.6 C)  99.3 F (37.4 C) 98.8 F (37.1 C)  TempSrc: Oral  Oral Oral  SpO2: 96%  98% 94%  Weight:      Height:        Constitutional: Alert and awake, oriented x3, not in any acute distress. Eyes: PERLA, EOMI, irises appear normal, anicteric sclera,  ENMT: external ears and nose appear normal.  Lips appears normal, oropharynx mucosa, tongue, posterior pharynx appear normal  Neck: neck appears normal, no masses, normal ROM, no thyromegaly, JVP difficult to determine d/t body habitus  CVS: S1-S2 clear, no murmur rubs or gallops, no LE edema  Respiratory:  clear to auscultation bilaterally, no wheezing, rales or rhonchi. Respiratory effort normal. No accessory muscle use.  Abdomen: soft nontender, nondistended, normal bowel sounds Musculoskeletal: : no cyanosis, clubbing or edema noted bilaterally Neuro: Cranial nerves II-XII grossly intact, strength, sensation, reflexes Psych: alert and oriented to person, place and situation. Pleasant  and cooperative.  Skin: no rashes or lesions or ulcers, no induration or nodules    Data reviewed:  I have personally reviewed following labs and imaging studies Labs:  CBC: Recent Labs  Lab 06/01/18 0858 06/06/18 1316 06/07/18 0412 06/07/18 1929  WBC 7.1  --  12.7* 14.1*  NEUTROABS  --   --   --  10.8*  HGB 12.0 8.8* 8.4* 8.9*  HCT 40.1 26.0* 28.3* 29.3*  MCV 98.8  --  100.0 98.0  PLT 217  --  185 119    Basic Metabolic Panel: Recent Labs  Lab 06/01/18 0858 06/06/18 1316 06/07/18 0412 06/07/18 1929  NA 147* 143 141 140  K 4.2 4.7 5.5* 4.7  CL 107  --  111 110  CO2 28  --  20* 22  GLUCOSE 162*  --  129* 122*  BUN 24*  --  28* 28*  CREATININE 1.77*  --  1.78* 1.71*  CALCIUM 9.7  --  8.2* 8.5*   GFR Estimated Creatinine Clearance: 30.3 mL/min (A) (by C-G formula based on SCr of 1.71 mg/dL (H)). Liver Function Tests: Recent Labs  Lab 06/01/18 0858  AST 26  ALT 25  ALKPHOS 101  BILITOT 0.9  PROT 6.8  ALBUMIN 3.9   No results for input(s): LIPASE, AMYLASE in the last 168 hours. No results for input(s): AMMONIA in the last 168 hours. Coagulation profile Recent Labs  Lab 06/01/18 0858  INR 0.94    Cardiac Enzymes: No results for input(s): CKTOTAL, CKMB, CKMBINDEX, TROPONINI in the last 168 hours. BNP: Invalid input(s): POCBNP CBG: Recent Labs  Lab 06/06/18 1437 06/07/18 0707 06/07/18 1200 06/07/18 1627 06/07/18 2128  GLUCAP 169* 117* 110* 124* 109*   D-Dimer No results for input(s): DDIMER in the last 72 hours. Hgb A1c Recent Labs    06/06/18 1932  HGBA1C 5.4   Lipid Profile No results for input(s): CHOL, HDL, LDLCALC, TRIG, CHOLHDL, LDLDIRECT in the last 72 hours. Thyroid function studies No results for input(s): TSH, T4TOTAL, T3FREE, THYROIDAB in the last 72 hours.  Invalid input(s): FREET3 Anemia work up No results for input(s): VITAMINB12, FOLATE, FERRITIN, TIBC, IRON, RETICCTPCT in the last 72 hours. Urinalysis    Component  Value Date/Time   COLORURINE STRAW (A) 06/01/2018 0919   APPEARANCEUR CLEAR 06/01/2018 0919   LABSPEC 1.003 (L) 06/01/2018 0919   PHURINE 5.0 06/01/2018 0919   GLUCOSEU >=500 (A) 06/01/2018 0919   HGBUR NEGATIVE 06/01/2018 0919   BILIRUBINUR NEGATIVE 06/01/2018 0919   KETONESUR NEGATIVE 06/01/2018 0919   PROTEINUR NEGATIVE 06/01/2018 0919   NITRITE NEGATIVE 06/01/2018 0919   LEUKOCYTESUR NEGATIVE 06/01/2018 0919     Microbiology Recent Results (from the past 240 hour(s))  Surgical pcr screen     Status: None   Collection Time: 06/01/18  8:57 AM  Result Value Ref Range Status   MRSA, PCR NEGATIVE NEGATIVE Final  Staphylococcus aureus NEGATIVE NEGATIVE Final    Comment: (NOTE) The Xpert SA Assay (FDA approved for NASAL specimens in patients 60 years of age and older), is one component of a comprehensive surveillance program. It is not intended to diagnose infection nor to guide or monitor treatment. Performed at [facility], [place] [address], [place], [place] [address]        Inpatient Medications:   Scheduled Meds: . aspirin  325 mg Oral QHS  . atorvastatin  20 mg Oral QHS  . buPROPion  150 mg Oral QHS  . docusate sodium  100 mg Oral BID  . memantine  28 mg Oral Daily   And  . donepezil  10 mg Oral Daily  . ezetimibe  5 mg Oral QHS  . HYDROcodone-acetaminophen  1 tablet Oral Q6H  . insulin aspart  0-15 Units Subcutaneous TID WC  . montelukast  10 mg Oral QHS  . multivitamin with minerals  1 tablet Oral Daily  . pantoprazole  80 mg Oral Daily  . pregabalin  75 mg Oral BID  . sertraline  50 mg Oral QHS  . sodium chloride flush  3 mL Intravenous Q12H  . tiZANidine  2 mg Oral QHS   Continuous Infusions: . sodium chloride 100 mL/hr at 06/07/18 1530  . sodium chloride 100 mL/hr at 06/07/18 0424  . methocarbamol (ROBAXIN) IV    . sodium chloride    . sodium chloride    . sodium chloride       Radiological Exams on Admission: Dg Lumbar Spine  Complete  Result Date: 06/06/2018 CLINICAL DATA:  Central laminectomy EXAM: LUMBAR SPINE - COMPLETE 4+ VIEW; DG C-ARM 61-120 MIN COMPARISON:  Radiograph 01/18/2018, MRI 12/28/2017 FINDINGS: Four low resolution intraoperative spot views of the lumbar spine. Total fluoroscopy time was 1 minutes 35 seconds. Posterior stabilization rods and pedicle screws with interbody device at what appears to be L4-L5. IMPRESSION: Intraoperative fluoroscopic assistance provided during lumbar spine surgery. Electronically Signed   By: Donavan Foil M.D.   On: 06/06/2018 14:48   Dg C-arm 1-60 Min  Result Date: 06/06/2018 CLINICAL DATA:  Central laminectomy EXAM: LUMBAR SPINE - COMPLETE 4+ VIEW; DG C-ARM 61-120 MIN COMPARISON:  Radiograph 01/18/2018, MRI 12/28/2017 FINDINGS: Four low resolution intraoperative spot views of the lumbar spine. Total fluoroscopy time was 1 minutes 35 seconds. Posterior stabilization rods and pedicle screws with interbody device at what appears to be L4-L5. IMPRESSION: Intraoperative fluoroscopic assistance provided during lumbar spine surgery. Electronically Signed   By: Donavan Foil M.D.   On: 06/06/2018 14:48   Dg C-arm 1-60 Min  Result Date: 06/06/2018 CLINICAL DATA:  Central laminectomy EXAM: LUMBAR SPINE - COMPLETE 4+ VIEW; DG C-ARM 61-120 MIN COMPARISON:  Radiograph 01/18/2018, MRI 12/28/2017 FINDINGS: Four low resolution intraoperative spot views of the lumbar spine. Total fluoroscopy time was 1 minutes 35 seconds. Posterior stabilization rods and pedicle screws with interbody device at what appears to be L4-L5. IMPRESSION: Intraoperative fluoroscopic assistance provided during lumbar spine surgery. Electronically Signed   By: Donavan Foil M.D.   On: 06/06/2018 14:48   Dg C-arm 1-60 Min  Result Date: 06/06/2018 CLINICAL DATA:  Central laminectomy EXAM: LUMBAR SPINE - COMPLETE 4+ VIEW; DG C-ARM 61-120 MIN COMPARISON:  Radiograph 01/18/2018, MRI 12/28/2017 FINDINGS: Four low resolution  intraoperative spot views of the lumbar spine. Total fluoroscopy time was 1 minutes 35 seconds. Posterior stabilization rods and pedicle screws with interbody device at what appears to be L4-L5. IMPRESSION: Intraoperative  fluoroscopic assistance provided during lumbar spine surgery. Electronically Signed   By: Donavan Foil M.D.   On: 06/06/2018 14:48    Impression/Recommendations  1. Hypotension, hx of HTN   - Patient has been hypotensive with SBP 70's to 90  - Antihypertensives were held this am, she received 250 cc bolus x2 today, and was started on IVF infusion at 100 cc/hr  - She seems to be asymptomatic with this  - Appears roughly euvolemic, though body habitus limits clinical assessment of volume status  - No fever or conspicuous infection; leukocytosis noted and likely reactive following surgery  - Plan to continue holding antihypertensives, hold diuretic, give 500 cc bolus now, follow I/O's and daily wts, check CXR and UA for ?infectious etiology, check EKG, repeat chemistries and CBC in am   2. Chronic diastolic CHF  - Appears to be well-compensated though body habitus limits clinical assessment of volume status  - Echo from May 2019 with preserved EF, grade 1 diastolic dysfunction  - Hold diuretic and ACE in setting of hypotension  - Follow daily wt and strict I/O's   3. Paroxysmal atrial fibrillation  - In a regular rhythm, not anticoagulated d/t fall-risk   - She has hx of bradycardia and is not on a rate-control agent  - Check EKG in light of hypotension   4. Renal insufficiency  - Scr is 1.71, slightly improved from preop value  - Renally-dose medications    5. Normocytic anemia  - Hgb 8.9 tonight, appears to be stable postoperatively  - Follow-up CBC in am    6. Spinal stenosis s/p laminectomy and fusion  - POD #1  - Management per surgery    Thank you for this consultation.  Our [facility] hospitalist team will follow the patient with you.   Time Spent: 87 minutes.    Ilene Qua Opyd M.D. [facility] 06/07/2018, 9:51 PM

## 2018-06-07 NOTE — Progress Notes (Signed)
Orthopedic Tech Progress Note Patient Details:  Cheryl Summers [birth year] [id number]  Patient ID: Cheryl Summers, female   DOB: 06-08-1941, 77 y.o.   MRN: [id number]   Hildred Priest 06/07/2018, 10:54 AM Called in bio-tech brace order; spoke with Marya Amsler

## 2018-06-07 NOTE — Progress Notes (Signed)
     Subjective: 1 Day Post-Op Procedure(s) (LRB): CENTRAL LAMINECTOMY L2-3, L3-4, L4-5, BILATERAL PARTIAL HEMILAMINECTOMY L5-S1, RIGHT L4-5 TRANSFORAMINAL LUMBAR INTERBODY FUSION WITH CAGE, MPACT SCREWS AND RODS, VIVIGEN, LOCAL AND ALLOGRAFT BONE GRAFT (N/A) Awake, alert ane oriented x 4. IVF 100 CC/HR. Given a fluid bolus this AM.  I feel a little light headed. No photophobia.  Does usually have coffee with breakfast. Foley just removed 15 minutes ago.  Patient reports pain as moderate.    Objective:   VITALS:  Temp:  [97.3 F (36.3 C)-98.5 F (36.9 C)] 97.8 F (36.6 C) (08/14 0437) Pulse Rate:  [71-89] 71 (08/14 0437) Resp:  [10-21] 16 (08/14 0437) BP: (76-158)/(44-91) 93/59 (08/14 0836) SpO2:  [83 %-100 %] 96 % (08/14 0437) Arterial Line BP: (136-157)/(37-43) 157/43 (08/13 1445) Weight:  [98 kg] 98 kg (08/14 0103)  Neurologically intact ABD soft Neurovascular intact Sensation intact distally Intact pulses distally Dorsiflexion/Plantar flexion intact Incision: no drainage and no fluctuance.    LABS Recent Labs    06/06/18 1316 06/07/18 0412  HGB 8.8* 8.4*  WBC  --  12.7*  PLT  --  185   Recent Labs    06/06/18 1316 06/07/18 0412  NA 143 141  K 4.7 5.5*  CL  --  111  CO2  --  20*  BUN  --  28*  CREATININE  --  1.78*  GLUCOSE  --  129*   No results for input(s): LABPT, INR in the last 72 hours.   Assessment/Plan: 1 Day Post-Op Procedure(s) (LRB): CENTRAL LAMINECTOMY L2-3, L3-4, L4-5, BILATERAL PARTIAL HEMILAMINECTOMY L5-S1, RIGHT L4-5 TRANSFORAMINAL LUMBAR INTERBODY FUSION WITH CAGE, MPACT SCREWS AND RODS, VIVIGEN, LOCAL AND ALLOGRAFT BONE GRAFT (N/A)  Anemia due to perioperative blood loss.   Advance diet Up with therapy Discharge to SNF when medically stable.Desires to go to [facility] in [place] post hospitalization.  Dr. Bettina Gavia, cardiology recommended a follow up EKG on POD#1 to assess for any return of afib.  Present anemia due to blood loss from  surgery.  Continue IVF due to anemia and recheck H/H.  Cheryl Summers 06/07/2018, 1:28 PMPatient ID: Cheryl Summers, female   DOB: [birth year], 77 y.o.   MRN: [id number]

## 2018-06-07 NOTE — Consult Note (Signed)
Physical Medicine and Rehabilitation Consult Reason for Consult: Decreased functional mobility Referring Physician: Dr. Louanne Skye   HPI: Cheryl Summers is a 77 y.o. right-handed female with history of chronic diastolic congestive heart failure, COPD, diabetes mellitus, renal insufficiency, tobacco abuse.Per chart review patient lives alone in [place].  Used a cane prior to admission.  She has multiple family in the area with good support.  Presented 06/05/2018 with low back pain radiating to the lower extremities.  X-rays and imaging revealed lumbar spondylolisthesis/stenosis with radiculopathy L2-3 to L5-S1.  Underwent central laminectomy L2-3, 3-4, L4-5, bilateral partial hemilaminectomy L5-S1 with lumbar interbody fusion 06/06/2018 per Dr. Louanne Skye.  Hospital course pain management.  Aspen lumbar brace when out of bed.  Physical and Occupational Therapy evaluations pending.  MD has requested physical medicine rehab consult.  Patient notes that since surgery her right arm has been clumsy. Review of Systems  Constitutional: Negative for chills and fever.  HENT: Negative for hearing loss.   Eyes: Negative for blurred vision and double vision.  Respiratory: Positive for cough and shortness of breath.   Cardiovascular: Positive for palpitations and leg swelling.  Gastrointestinal: Positive for constipation. Negative for nausea.  Genitourinary: Negative for dysuria, flank pain and hematuria.  Musculoskeletal: Positive for back pain and myalgias.  Skin: Negative for rash.  Neurological: Positive for tremors.  Psychiatric/Behavioral: Positive for depression.  All other systems reviewed and are negative.  Past Medical History:  Diagnosis Date  . Anxiety   . Carotid artery stenosis, asymptomatic    50% ASYMPTOMATIC  RIGHT CAROTID STENOSIS  . Chronic diastolic heart failure ([place]) 06/09/2015  . COPD (chronic obstructive pulmonary disease) ([place])   . Depression   . Diabetes mellitus without  complication ([place])   . Dysrhythmia   . Gastroesophageal reflux   . Hyperlipidemia   . Hypertension   . Hypertensive heart disease with heart failure ([place]) 06/09/2015  . PAF (paroxysmal atrial fibrillation) ([place]) 06/09/2015   Overview:  CHADS2 vasc score=6  . Renal insufficiency   . Tobacco abuse   . Tremors of nervous system    Past Surgical History:  Procedure Laterality Date  . ABDOMINAL HYSTERECTOMY    . CORONARY ARTERY BYPASS GRAFT  2012  . NOSE SURGERY     Family History  Problem Relation Age of Onset  . Heart attack Mother   . Hypertension Mother   . Hyperlipidemia Mother   . Heart attack Father   . Hypertension Father   . Hyperlipidemia Father   . Heart attack Sister   . Stroke Brother   . Heart attack Brother   . Hypertension Daughter    Social History:  reports that she has quit smoking. She has never used smokeless tobacco. She reports that she does not drink alcohol or use drugs. Allergies: No Known Allergies Facility-Administered Medications Prior to Admission  Medication Dose Route Frequency Provider Last Rate Last Dose  . betamethasone acetate-betamethasone sodium phosphate (CELESTONE) injection 12 mg  12 mg Other Once Magnus Sinning, MD       Medications Prior to Admission  Medication Sig Dispense Refill  . acetaminophen (TYLENOL) 650 MG CR tablet Take 1,300 mg by mouth daily at 2 PM.    . albuterol (PROVENTIL HFA;VENTOLIN HFA) 108 (90 Base) MCG/ACT inhaler Inhale 2 puffs into the lungs every 6 (six) hours as needed for wheezing or shortness of breath.    Marland Kitchen albuterol (PROVENTIL) (2.5 MG/3ML) 0.083% nebulizer solution Take 2.5 mg by nebulization daily as  needed for wheezing or shortness of breath.    Marland Kitchen amLODipine (NORVASC) 10 MG tablet Take 10 mg by mouth daily.      . Artificial Tear Solution (SOOTHE XP) SOLN Place 1 drop into both eyes daily as needed (dry eyes).    Marland Kitchen aspirin (GOODSENSE ASPIRIN) 325 MG tablet Take 325 mg by mouth at bedtime.     Marland Kitchen  atorvastatin (LIPITOR) 20 MG tablet Take 20 mg by mouth at bedtime.     Marland Kitchen buPROPion (WELLBUTRIN XL) 150 MG 24 hr tablet Take 150 mg by mouth at bedtime.     . dapagliflozin propanediol (FARXIGA) 10 MG TABS tablet Take 10 mg by mouth daily.    Marland Kitchen ezetimibe (ZETIA) 10 MG tablet Take 5 mg by mouth at bedtime.     Marland Kitchen HYDROcodone-acetaminophen (NORCO/VICODIN) 5-325 MG tablet Take 1 tablet by mouth 2 (two) times daily. 40 tablet 0  . Memantine HCl-Donepezil HCl (NAMZARIC) 28-10 MG CP24 Take 1 capsule by mouth daily.    . montelukast (SINGULAIR) 10 MG tablet Take 10 mg by mouth at bedtime.     . Multiple Vitamin (MULTIVITAMIN) tablet Take 1 tablet by mouth daily.    Marland Kitchen nystatin-triamcinolone (MYCOLOG II) cream Apply 1 application topically daily as needed (skin bumps).    Marland Kitchen omeprazole (PRILOSEC) 20 MG capsule Take 20 mg by mouth daily as needed (acid reflux).     . pregabalin (LYRICA) 75 MG capsule Take 75 mg by mouth 2 (two) times daily.    . quinapril (ACCUPRIL) 40 MG tablet Take 40 mg by mouth daily.    . sertraline (ZOLOFT) 50 MG tablet Take 50 mg by mouth at bedtime.     . sodium chloride (OCEAN) 0.65 % SOLN nasal spray Place 1 spray into both nostrils as needed for congestion.    Marland Kitchen tiZANidine (ZANAFLEX) 2 MG tablet Take 2 mg by mouth at bedtime.     . torsemide (DEMADEX) 20 MG tablet Take 1 tablet (20 mg total) by mouth daily. 90 tablet 3  . budesonide-formoterol (SYMBICORT) 160-4.5 MCG/ACT inhaler Inhale 2 puffs into the lungs 2 (two) times daily as needed (shortness of breath).     Marland Kitchen HYDROcodone-acetaminophen (NORCO/VICODIN) 5-325 MG tablet Take 1 tablet by mouth every 6 (six) hours as needed for moderate pain. 30 tablet 0    Home: Home Living Family/patient expects to be discharged to:: Private residence Living Arrangements: Alone  Functional History:   Functional Status:  Mobility:          ADL:    Cognition: Cognition Orientation Level: Oriented X4    Blood pressure (!)  89/51, pulse 71, temperature 97.8 F (36.6 C), temperature source Oral, resp. rate 16, height 5\' 4"  (1.626 m), weight 98 kg, SpO2 96 %. Physical Exam  Vitals reviewed. Constitutional:  77 year old right-handed obese female  HENT:  Head: Normocephalic.  Eyes: EOM are normal.  Neck: Normal range of motion. Neck supple. No thyromegaly present.  Cardiovascular: Normal rate, regular rhythm and normal heart sounds.  No murmur heard. Cardiac rate controlled  Respiratory: Effort normal and breath sounds normal. No respiratory distress. She has no wheezes.  GI: Soft. Bowel sounds are normal. She exhibits no distension.  Neurological: She is alert. Gait abnormal.  Provides her name, age, date of birth.  Follows simple commands  Patient with ataxia right finger-nose-finger She has absent position sense in the fingers of the right upper extremity. Motor strength is 4/5 in the right deltoid by stress of grip  5/5 in left deltoid by stress of grip 5/5 bilateral hip flexor knee extensor ankle dorsiflexor.  Skin: Skin is warm and dry.  Back incision is dressed  Psychiatric: She has a normal mood and affect.    Results for orders placed or performed during the hospital encounter of 06/06/18 (from the past 24 hour(s))  Glucose, capillary     Status: Abnormal   Collection Time: 06/06/18  5:55 AM  Result Value Ref Range   Glucose-Capillary 145 (H) 70 - 99 mg/dL  I-STAT 7, (LYTES, BLD GAS, ICA, H+H)     Status: Abnormal   Collection Time: 06/06/18  1:16 PM  Result Value Ref Range   pH, Arterial 7.347 (L) 7.350 - 7.450   pCO2 arterial 44.2 32.0 - 48.0 mmHg   pO2, Arterial 251.0 (H) 83.0 - 108.0 mmHg   Bicarbonate 24.2 20.0 - 28.0 mmol/L   TCO2 26 22 - 32 mmol/L   O2 Saturation 100.0 %   Acid-base deficit 1.0 0.0 - 2.0 mmol/L   Sodium 143 135 - 145 mmol/L   Potassium 4.7 3.5 - 5.1 mmol/L   Calcium, Ion 1.16 1.15 - 1.40 mmol/L   HCT 26.0 (L) 36.0 - 46.0 %   Hemoglobin 8.8 (L) 12.0 - 15.0 g/dL    Patient temperature HIDE    Sample type ARTERIAL   Glucose, capillary     Status: Abnormal   Collection Time: 06/06/18  1:18 PM  Result Value Ref Range   Glucose-Capillary 168 (H) 70 - 99 mg/dL  Glucose, capillary     Status: Abnormal   Collection Time: 06/06/18  2:37 PM  Result Value Ref Range   Glucose-Capillary 169 (H) 70 - 99 mg/dL  Hemoglobin A1c     Status: None   Collection Time: 06/06/18  7:32 PM  Result Value Ref Range   Hgb A1c MFr Bld 5.4 4.8 - 5.6 %   Mean Plasma Glucose 108.28 mg/dL   Dg Lumbar Spine Complete  Result Date: 06/06/2018 CLINICAL DATA:  Central laminectomy EXAM: LUMBAR SPINE - COMPLETE 4+ VIEW; DG C-ARM 61-120 MIN COMPARISON:  Radiograph 01/18/2018, MRI 12/28/2017 FINDINGS: Four low resolution intraoperative spot views of the lumbar spine. Total fluoroscopy time was 1 minutes 35 seconds. Posterior stabilization rods and pedicle screws with interbody device at what appears to be L4-L5. IMPRESSION: Intraoperative fluoroscopic assistance provided during lumbar spine surgery. Electronically Signed   By: Donavan Foil M.D.   On: 06/06/2018 14:48   Dg C-arm 1-60 Min  Result Date: 06/06/2018 CLINICAL DATA:  Central laminectomy EXAM: LUMBAR SPINE - COMPLETE 4+ VIEW; DG C-ARM 61-120 MIN COMPARISON:  Radiograph 01/18/2018, MRI 12/28/2017 FINDINGS: Four low resolution intraoperative spot views of the lumbar spine. Total fluoroscopy time was 1 minutes 35 seconds. Posterior stabilization rods and pedicle screws with interbody device at what appears to be L4-L5. IMPRESSION: Intraoperative fluoroscopic assistance provided during lumbar spine surgery. Electronically Signed   By: Donavan Foil M.D.   On: 06/06/2018 14:48   Dg C-arm 1-60 Min  Result Date: 06/06/2018 CLINICAL DATA:  Central laminectomy EXAM: LUMBAR SPINE - COMPLETE 4+ VIEW; DG C-ARM 61-120 MIN COMPARISON:  Radiograph 01/18/2018, MRI 12/28/2017 FINDINGS: Four low resolution intraoperative spot views of the lumbar  spine. Total fluoroscopy time was 1 minutes 35 seconds. Posterior stabilization rods and pedicle screws with interbody device at what appears to be L4-L5. IMPRESSION: Intraoperative fluoroscopic assistance provided during lumbar spine surgery. Electronically Signed   By: Donavan Foil M.D.   On: 06/06/2018 14:48  Dg C-arm 1-60 Min  Result Date: 06/06/2018 CLINICAL DATA:  Central laminectomy EXAM: LUMBAR SPINE - COMPLETE 4+ VIEW; DG C-ARM 61-120 MIN COMPARISON:  Radiograph 01/18/2018, MRI 12/28/2017 FINDINGS: Four low resolution intraoperative spot views of the lumbar spine. Total fluoroscopy time was 1 minutes 35 seconds. Posterior stabilization rods and pedicle screws with interbody device at what appears to be L4-L5. IMPRESSION: Intraoperative fluoroscopic assistance provided during lumbar spine surgery. Electronically Signed   By: Donavan Foil M.D.   On: 06/06/2018 14:48     Assessment/Plan: Diagnosis: Lumbar spinal stenosis status post decompression L2-L5 and fusion L4-5 1. Does the need for close, 24 hr/day medical supervision in concert with the patient's rehab needs make it unreasonable for this patient to be served in a less intensive setting? Yes and Potentially 2. Co-Morbidities requiring supervision/potential complications: Diabetes with neuropathy, COPD, renal insufficiency 3. Due to bladder management, bowel management, safety, skin/wound care, disease management, medication administration, pain management and patient education, does the patient require 24 hr/day rehab nursing? Yes 4. Does the patient require coordinated care of a physician, rehab nurse, PT (1-2 hrs/day, 5 days/week) and OT (1-2 hrs/day, 5 days/week) to address physical and functional deficits in the context of the above medical diagnosis(es)? Yes Addressing deficits in the following areas: balance, endurance, locomotion, strength, transferring, bowel/bladder control, bathing, dressing, feeding, grooming and  toileting 5. Can the patient actively participate in an intensive therapy program of at least 3 hrs of therapy per day at least 5 days per week? Yes 6. The potential for patient to make measurable gains while on inpatient rehab is good 7. Anticipated functional outcomes upon discharge from inpatient rehab are modified independent and supervision  with PT, modified independent and supervision with OT, n/a with SLP. 8. Estimated rehab length of stay to reach the above functional goals is: 7-10d 9. Anticipated D/C setting: Home 10. Anticipated post D/C treatments: [place] therapy 11. Overall Rehab/Functional Prognosis: good  RECOMMENDATIONS: This patient's condition is appropriate for continued rehabilitative care in the following setting: CIR Patient has agreed to participate in recommended program. Yes Note that insurance prior authorization may be required for reimbursement for recommended care.  Comment: Would recommend evaluation of right upper extremity proprioceptive deficits "I have personally performed a face to face diagnostic evaluation of this patient.  Additionally, I have reviewed and concur with the physician assistant's documentation above." Charlett Blake M.D. [facility] FAAPM&R (Sports Med, Neuromuscular Med) Diplomate Am Board of Electrodiagnostic Med  Elizabeth Sauer 06/07/2018

## 2018-06-07 NOTE — Progress Notes (Signed)
Pt daughter told Rn that she is worried about the patients arm being so spastic after surgery and would like to know if we could do a test or anything to make sure everything was okay. RN passed message onto the MD, and he stated to take the IV out and let that arm relax and see if it gets any better.

## 2018-06-07 NOTE — Progress Notes (Signed)
Subjective: Patient has good pain control.  States that the numbness and tingling in the right upper extremity she was having in the recovery room is improved.  No complaints of leg pain.  Denies lightheadedness, dizziness, headaches, nausea/vomiting, photophobia.   Objective: Vital signs in last 24 hours: Temp:  [97.3 F (36.3 C)-98.5 F (36.9 C)] 97.8 F (36.6 C) (08/14 0437) Pulse Rate:  [71-89] 71 (08/14 0437) Resp:  [10-21] 16 (08/14 0437) BP: (76-158)/(44-91) 93/59 (08/14 0836) SpO2:  [83 %-100 %] 96 % (08/14 0437) Arterial Line BP: (136-157)/(37-43) 157/43 (08/13 1445) Weight:  [98 kg] 98 kg (08/14 0103)  Intake/Output from previous day: 08/13 0701 - 08/14 0700 In: 2825 [I.V.:2600; Blood:225] Out: 1140 [Urine:490; Blood:650] Intake/Output this shift: Total I/O In: -  Out: 1175 [Urine:1175]  Recent Labs    06/06/18 1316 06/07/18 0412  HGB 8.8* 8.4*   Recent Labs    06/06/18 1316 06/07/18 0412  WBC  --  12.7*  RBC  --  2.83*  HCT 26.0* 28.3*  PLT  --  185   Recent Labs    06/06/18 1316 06/07/18 0412  NA 143 141  K 4.7 5.5*  CL  --  111  CO2  --  20*  BUN  --  28*  CREATININE  --  1.78*  GLUCOSE  --  129*  CALCIUM  --  8.2*   No results for input(s): LABPT, INR in the last 72 hours.  Exam Very pleasant elderly female alert and oriented in no acute distress.  Lumbar wound looks good.  No drainage or signs of infection.  Bilateral calves nontender.  Neurovascular intact.  No focal motor deficits.      Assessment/Plan: I spoke with RN.  Advised her that the SCDs need to be applied.  They have not been on patient per order since arrival to the unit yesterday.  Also discussed this with the charge nurse.  Dr. Louanne Skye gave an order for patient to be up and ambulating.   Benjiman Core 06/07/2018, 1:23 PM

## 2018-06-08 ENCOUNTER — Inpatient Hospital Stay (HOSPITAL_COMMUNITY): Payer: PPO

## 2018-06-08 DIAGNOSIS — Z981 Arthrodesis status: Secondary | ICD-10-CM

## 2018-06-08 DIAGNOSIS — N289 Disorder of kidney and ureter, unspecified: Secondary | ICD-10-CM

## 2018-06-08 DIAGNOSIS — I9589 Other hypotension: Secondary | ICD-10-CM

## 2018-06-08 DIAGNOSIS — M48062 Spinal stenosis, lumbar region with neurogenic claudication: Principal | ICD-10-CM

## 2018-06-08 DIAGNOSIS — E861 Hypovolemia: Secondary | ICD-10-CM

## 2018-06-08 DIAGNOSIS — M4316 Spondylolisthesis, lumbar region: Secondary | ICD-10-CM

## 2018-06-08 LAB — PREPARE RBC (CROSSMATCH)

## 2018-06-08 LAB — BASIC METABOLIC PANEL
Anion gap: 5 (ref 5–15)
BUN: 27 mg/dL — AB (ref 8–23)
CALCIUM: 8.1 mg/dL — AB (ref 8.9–10.3)
CO2: 23 mmol/L (ref 22–32)
Chloride: 113 mmol/L — ABNORMAL HIGH (ref 98–111)
Creatinine, Ser: 1.59 mg/dL — ABNORMAL HIGH (ref 0.44–1.00)
GFR calc Af Amer: 35 mL/min — ABNORMAL LOW (ref >60)
GFR, EST NON AFRICAN AMERICAN: 30 mL/min — AB (ref >60)
GLUCOSE: 152 mg/dL — AB (ref 70–99)
POTASSIUM: 4.7 mmol/L (ref 3.5–5.1)
Sodium: 141 mmol/L (ref 135–145)

## 2018-06-08 LAB — CBC
HCT: 25.9 % — ABNORMAL LOW (ref 36.0–46.0)
HEMATOCRIT: 26.3 % — AB (ref 36.0–46.0)
HEMOGLOBIN: 8.3 g/dL — AB (ref 12.0–15.0)
Hemoglobin: 7.9 g/dL — ABNORMAL LOW (ref 12.0–15.0)
MCH: 30.3 pg (ref 26.0–34.0)
MCH: 30.4 pg (ref 26.0–34.0)
MCHC: 30.5 g/dL (ref 30.0–36.0)
MCHC: 31.6 g/dL (ref 30.0–36.0)
MCV: 99.2 fL (ref 78.0–100.0)
MCV: 96.3 fL (ref 78.0–100.0)
PLATELETS: 150 10*3/uL (ref 150–400)
Platelets: 136 10*3/uL — ABNORMAL LOW (ref 150–400)
RBC: 2.61 MIL/uL — ABNORMAL LOW (ref 3.87–5.11)
RBC: 2.73 MIL/uL — ABNORMAL LOW (ref 3.87–5.11)
RDW: 14.8 % (ref 11.5–15.5)
RDW: 15.4 % (ref 11.5–15.5)
WBC: 11.1 10*3/uL — ABNORMAL HIGH (ref 4.0–10.5)
WBC: 9.7 10*3/uL (ref 4.0–10.5)

## 2018-06-08 LAB — GLUCOSE, CAPILLARY
Glucose-Capillary: 131 mg/dL — ABNORMAL HIGH (ref 70–99)
Glucose-Capillary: 123 mg/dL — ABNORMAL HIGH (ref 70–99)
Glucose-Capillary: 132 mg/dL — ABNORMAL HIGH (ref 70–99)
Glucose-Capillary: 104 mg/dL — ABNORMAL HIGH (ref 70–99)

## 2018-06-08 LAB — RETICULOCYTES
RBC.: 2.48 MIL/uL — AB (ref 3.87–5.11)
RETIC CT PCT: 2.3 % (ref 0.4–3.1)
Retic Count, Absolute: 57 10*3/uL (ref 19.0–186.0)

## 2018-06-08 LAB — IRON AND TIBC
Iron: 14 ug/dL — ABNORMAL LOW (ref 28–170)
SATURATION RATIOS: 5 % — AB (ref 10.4–31.8)
TIBC: 272 ug/dL (ref 250–450)
UIBC: 258 ug/dL

## 2018-06-08 LAB — VITAMIN B12: VITAMIN B 12: 186 pg/mL (ref 180–914)

## 2018-06-08 LAB — HEMOGLOBIN AND HEMATOCRIT, BLOOD
HCT: 26.4 % — ABNORMAL LOW (ref 36.0–46.0)
Hemoglobin: 8.2 g/dL — ABNORMAL LOW (ref 12.0–15.0)

## 2018-06-08 LAB — TROPONIN I: Troponin I: 0.5 ng/mL (ref <0.03)

## 2018-06-08 LAB — BRAIN NATRIURETIC PEPTIDE: B NATRIURETIC PEPTIDE 5: 611.4 pg/mL — AB (ref 0.0–100.0)

## 2018-06-08 LAB — CORTISOL: Cortisol, Plasma: 8.6 ug/dL

## 2018-06-08 MED ORDER — ACETAMINOPHEN 325 MG PO TABS
650.0000 mg | ORAL_TABLET | Freq: Once | ORAL | Status: AC
  Administered 2018-06-08: 650 mg via ORAL
  Filled 2018-06-08: qty 2

## 2018-06-08 MED ORDER — SODIUM CHLORIDE 0.9% IV SOLUTION
Freq: Once | INTRAVENOUS | Status: AC
  Administered 2018-06-08: 16:00:00 via INTRAVENOUS

## 2018-06-08 MED ORDER — SODIUM CHLORIDE 0.9 % IV BOLUS
1000.0000 mL | Freq: Once | INTRAVENOUS | Status: AC
  Administered 2018-06-08: 1000 mL via INTRAVENOUS

## 2018-06-08 MED ORDER — DIPHENHYDRAMINE HCL 25 MG PO CAPS
25.0000 mg | ORAL_CAPSULE | Freq: Once | ORAL | Status: AC
  Administered 2018-06-08: 25 mg via ORAL
  Filled 2018-06-08: qty 1

## 2018-06-08 MED FILL — Heparin Sodium (Porcine) Inj 1000 Unit/ML: INTRAMUSCULAR | Qty: 30 | Status: AC

## 2018-06-08 MED FILL — Sodium Chloride IV Soln 0.9%: INTRAVENOUS | Qty: 2000 | Status: AC

## 2018-06-08 NOTE — Progress Notes (Signed)
Physical Therapy Treatment Patient Details Name: Cheryl Summers MRN: [id number] DOB: 08-31-1941 Today's Date: 06/08/2018    History of Present Illness Patient is a 77 y/o female s/p CENTRAL LAMINECTOMY L2-3, L3-4, L4-5, BILATERAL PARTIAL HEMILAMINECTOMY L5-S1, RIGHT L4-5 TRANSFORAMINAL LUMBAR INTERBODY FUSION WITH CAGE, MPACT SCREWS AND RODS, VIVIGEN, LOCAL AND ALLOGRAFT BONE GRAFT on 06/06/18. PMH significant for anxiety, COPD, depression, HTN, PAF.    PT Comments    Pt is progressing with mobility, she tolerated increased ambulation distance of 25' with RW today, distance limited by LE fatigue. Min/mod assist for bed mobility, min A for sit to stand. Reviewed back precautions and log roll. Instructed pt in LE strengthening exercises to be done independently.    Follow Up Recommendations  Follow surgeon's recommendation for DC plan and follow-up therapies;SNF(pt reports she plans to DC to SNF)     Equipment Recommendations  Rolling walker with 5" wheels    Recommendations for Other Services OT consult     Precautions / Restrictions Precautions Precautions: Back;Fall Precaution Booklet Issued: Yes (comment) Precaution Comments: reviewed 3/3 back precautions and logroll Required Braces or Orthoses: Spinal Brace Spinal Brace: Lumbar corset;Applied in sitting position Restrictions Weight Bearing Restrictions: No    Mobility  Bed Mobility Overal bed mobility: Needs Assistance Bed Mobility: Rolling;Sidelying to Sit Rolling: Min assist Sidelying to sit: Mod assist       General bed mobility comments: assist for log rolling and trunk control to come to sitting  Transfers Overall transfer level: Needs assistance Equipment used: Rolling walker (2 wheeled) Transfers: Sit to/from Stand Sit to Stand: Min assist         General transfer comment: Min A to power up from bedside;  verbal cueing for hand placement  Ambulation/Gait Ambulation/Gait assistance: Min guard Gait  Distance (Feet): 25 Feet Assistive device: Rolling walker (2 wheeled) Gait Pattern/deviations: Step-to pattern;Decreased stride length Gait velocity: decreased   General Gait Details: steady with RW, distance limited by BLE fatigue, no RUE spasticity noted today, some wheezing noted with mobility, pt stated this is baseline   Marine scientist Rankin (Stroke Patients Only)       Balance Overall balance assessment: Needs assistance Sitting-balance support: Single extremity supported;Feet supported Sitting balance-Leahy Scale: Fair     Standing balance support: Bilateral upper extremity supported;During functional activity Standing balance-Leahy Scale: Poor                              Cognition Arousal/Alertness: Awake/alert Behavior During Therapy: WFL for tasks assessed/performed Overall Cognitive Status: Within Functional Limits for tasks assessed                                        Exercises General Exercises - Lower Extremity Ankle Circles/Pumps: AROM;Both;10 reps;Seated Long Arc Quad: AROM;10 reps;Seated;Both    General Comments        Pertinent Vitals/Pain Pain Score: 8 (with walking) Pain Location: low back Pain Descriptors / Indicators: Sore Pain Intervention(s): Limited activity within patient's tolerance;Monitored during session;Premedicated before session    Home Living                      Prior Function            PT Goals (  current goals can now be found in the care plan section) Acute Rehab PT Goals Patient Stated Goal: "Return to my dogs" PT Goal Formulation: With patient Time For Goal Achievement: 06/21/18 Potential to Achieve Goals: Good Progress towards PT goals: Progressing toward goals    Frequency    Min 5X/week      PT Plan Current plan remains appropriate    Co-evaluation              AM-PAC PT "6 Clicks" Daily Activity  Outcome  Measure  Difficulty turning over in bed (including adjusting bedclothes, sheets and blankets)?: Unable Difficulty moving from lying on back to sitting on the side of the bed? : Unable Difficulty sitting down on and standing up from a chair with arms (e.g., wheelchair, bedside commode, etc,.)?: Unable Help needed moving to and from a bed to chair (including a wheelchair)?: A Little Help needed walking in hospital room?: A Little Help needed climbing 3-5 steps with a railing? : A Lot 6 Click Score: 11    End of Session Equipment Utilized During Treatment: Gait belt Activity Tolerance: Patient tolerated treatment well Patient left: in chair;with call bell/phone within reach;with chair alarm set Nurse Communication: Mobility status PT Visit Diagnosis: Unsteadiness on feet (R26.81);Other abnormalities of gait and mobility (R26.89);Muscle weakness (generalized) (M62.81);Pain     Time: 8546-2703 PT Time Calculation (min) (ACUTE ONLY): 19 min  Charges:  $Gait Training: 8-22 mins                        Blondell Reveal Kistler 06/08/2018, 9:24 AM [phone]

## 2018-06-08 NOTE — Consult Note (Signed)
[facility] CM Primary Care Navigator  06/08/2018  Cheryl Summers 1941-09-06 [id number]   Met withpatient, son Cheryl Summers) at the bedside and spoke with daughter Cheryl Summers) over the phone toidentify possible discharge needs. Daughter reportsthat patient "had increased pain to her back" thatresulted to thisadmission/ surgery. (lumbar spinal stenosis, underwent lumbar laminectomy with fusion)  Patient's daughter endorsesDr.Nina Uppin with [facility] as theprimary care provider.   [facility] [place] [facility] without current difficulty.Daughter was encouraged to notify primary care provider for any medication needs/ issues in the future in order to get assistance if needed.  Daughterreports that she has been managing medications for patient at [facility] use of "pill box" system filled once a week.  Patient's daughter provides transportationto herdoctors'appointments.  Patientis living at home by herself but daughter comes and goes to check and assist with her needs. Daughter serves as the primary caregiver for patient.  Anticipated plan for discharge isskilled nursing facility ([facility] )for rehabilitationper therapy recommendation.  Patient and childrenvoiced understandingto callprimarycareprovider'soffice whenshereturns backhome,for a post discharge follow-up visitwithin1- 2 weeksor sooner if needs arise.Patient letter (with PCP's contact number) was provided asareminder.   Explained topatient and childrenregarding [facility] CM services available for health management/ resourcesat homeandhadvoiced their interest for it. Daughter plans to discuss with primary care provider on patient's next visit about further needs in managing her health issues (congestive HF/ COPD/DM HTN) once she gets back home. Patient's daughter and sonverbalizedunderstandingto seekreferral  from primary care provider to [facility] care management ifdeemed necessary and appropriatefor anyservicesin the nearfuture,once patient returnshome.   [facility] care management information was provided for futureneeds thatpatient may have.  Primary care provider's office is listed as providing transition of care (TOC) follow-up.    For additional questions please contact:  Edwena Felty A. Jiovani Mccammon, BSN, RN-BC [facility] PRIMARY CARE Navigator Cell: [phone]

## 2018-06-08 NOTE — Progress Notes (Signed)
PROGRESS NOTE    Cheryl Summers  DDU:[id number] DOB: 1940-12-19 DOA: 06/06/2018 PCP: Nicholos Johns, MD (Confirm with patient/family/NH records and if not entered, this HAS to be entered at [facility] point of entry. "No PCP" if truly none.)   Brief Narrative: (Start on day 1 of progress note - keep it brief and live) 77 year old female with history of paroxysmal atrial fibrillation, chronic diastolic congestive heart failure, CAD/P CABG, hypertension, lumbar spinal stenosis underwent lumbar laminectomy with fusion.  Postoperative course was complicated by hypotension, spinal fluid leak.  Patient received 250 mL of normal saline bolus with improvement.  Patient lost about 650 mL blood loss during the surgery, 250 mL replaced from [employer].   Assessment & Plan:   Principal Problem:   Spinal stenosis, lumbar region with neurogenic claudication Active Problems:   Chronic diastolic heart failure (HCC)   PAF (paroxysmal atrial fibrillation) (HCC)   Spondylolisthesis, lumbar region   Status post lumbar spinal fusion   Renal insufficiency   Hypotension   ##Hypotension -Patient continues to have low normal blood pressure despite holding all the blood pressure medications -Give 1 L fluid bolus -Rule out any underlying infection with urine analysis and chest x-ray, considering elevated white blood cell count yesterday  ##Acute renal failure  -secondary to ATN -Continue with IV fluids -Keep the blood pressure over 237 systolic  ##Acute blood loss anemia plan -patient's hemoglobin continues to trend down -Patient's baseline hemoglobin is 12 -Hemoglobin trended down to 7.9 today -Anemia work-up with iron profile, B12, folate RBC, stool occult, reticulocyte count plan-patient is receiving 1 unit of packed RBC  ##Leukocytosis -Most likely reactive -Rule out any underlying infections with urinalysis, chest x-ray  ##Spinal fluid leak pain -improved  ##Spinal stenosis -S/P laminectomy L2-L3  L3-L4 L4-L5 -Improving with physical therapy -Plan to discharge the patient to SNF  ##Chronic congestive heart failure diastolic -Patient looks euvolemic  ##Obesity -Patient has BMI 35-39 -Counseling done regarding diet and exercise  ##Diabetes mellitus -Follow-up on sliding scale insulin  ##Depression -On BuSpar  ##CAD/P CABG  -hold aspirin considering patient's hemoglobin trend down -Continue with the statin    DVT prophylaxis: (Lovenox/Heparin/SCD's/anticoagulated/None (if comfort care) Code Status: (Full/Partial - specify details) Family Communication: (Specify name, relationship & date discussed. NO "discussed with patient") Disposition Plan: (specify when and where you expect patient to be discharged). Include barriers to DC in this tab.     Subjective: Improved pain from yesterday.  Treated with physical therapy is able to walk 25 feet.  Patient's son is at bedside  Objective: Vitals:   06/08/18 0203 06/08/18 0433 06/08/18 1000 06/08/18 1004  BP: (!) 156/58 (!) 104/56 (!) 104/52 (!) 91/50  Pulse: 85 72 79 93  Resp: 18 16    Temp: 98.3 F (36.8 C) 98.7 F (37.1 C)    TempSrc: Oral Oral    SpO2: 94% 93%    Weight:  104.5 kg    Height:        Intake/Output Summary (Last 24 hours) at 06/08/2018 1403 Last data filed at 06/08/2018 1012 Gross per 24 hour  Intake 2190.01 ml  Output 1050 ml  Net 1140.01 ml   Filed Weights   06/07/18 0103 06/08/18 0433  Weight: 98 kg 104.5 kg    Examination:  General exam: Appears calm and comfortable  Respiratory system: Clear to auscultation. Respiratory effort normal. Cardiovascular system: S1 & S2 heard, RRR. No JVD, murmurs, rubs, gallops or clicks. No pedal edema. Gastrointestinal system: Abdomen is nondistended, soft  and nontender. No organomegaly or masses felt. Normal bowel sounds heard. Central nervous system: Alert and oriented. No focal neurological deficits. Extremities: Symmetric 5 x 5 power. Skin: No  rashes, lesions or ulcers Psychiatry: Judgement and insight appear normal. Mood & affect appropriate.     Data Reviewed: I have personally reviewed following labs and imaging studies  CBC: Recent Labs  Lab 06/06/18 1316 06/07/18 0412 06/07/18 1929 06/08/18 0413  WBC  --  12.7* 14.1* 11.1*  NEUTROABS  --   --  10.8*  --   HGB 8.8* 8.4* 8.9* 7.9*  HCT 26.0* 28.3* 29.3* 25.9*  MCV  --  100.0 98.0 99.2  PLT  --  185 175 628   Basic Metabolic Panel: Recent Labs  Lab 06/06/18 1316 06/07/18 0412 06/07/18 1929 06/08/18 0413  NA 143 141 140 141  K 4.7 5.5* 4.7 4.7  CL  --  111 110 113*  CO2  --  20* 22 23  GLUCOSE  --  129* 122* 152*  BUN  --  28* 28* 27*  CREATININE  --  1.78* 1.71* 1.59*  CALCIUM  --  8.2* 8.5* 8.1*   GFR: Estimated Creatinine Clearance: 33.8 mL/min (A) (by C-G formula based on SCr of 1.59 mg/dL (H)). Liver Function Tests: No results for input(s): AST, ALT, ALKPHOS, BILITOT, PROT, ALBUMIN in the last 168 hours. No results for input(s): LIPASE, AMYLASE in the last 168 hours. No results for input(s): AMMONIA in the last 168 hours. Coagulation Profile: No results for input(s): INR, PROTIME in the last 168 hours. Cardiac Enzymes: Recent Labs  Lab 06/08/18 1019  TROPONINI 0.50*   BNP (last 3 results) No results for input(s): PROBNP in the last 8760 hours. HbA1C: Recent Labs    06/06/18 1932  HGBA1C 5.4   CBG: Recent Labs  Lab 06/07/18 1200 06/07/18 1627 06/07/18 2128 06/08/18 0611 06/08/18 1134  GLUCAP 110* 124* 109* 131* 123*   Lipid Profile: No results for input(s): CHOL, HDL, LDLCALC, TRIG, CHOLHDL, LDLDIRECT in the last 72 hours. Thyroid Function Tests: No results for input(s): TSH, T4TOTAL, FREET4, T3FREE, THYROIDAB in the last 72 hours. Anemia Panel: No results for input(s): VITAMINB12, FOLATE, FERRITIN, TIBC, IRON, RETICCTPCT in the last 72 hours. Sepsis Labs: No results for input(s): PROCALCITON, LATICACIDVEN in the last 168  hours.  Recent Results (from the past 240 hour(s))  Surgical pcr screen     Status: None   Collection Time: 06/01/18  8:57 AM  Result Value Ref Range Status   MRSA, PCR NEGATIVE NEGATIVE Final   Staphylococcus aureus NEGATIVE NEGATIVE Final    Comment: (NOTE) The Xpert SA Assay (FDA approved for NASAL specimens in patients 70 years of age and older), is one component of a comprehensive surveillance program. It is not intended to diagnose infection nor to guide or monitor treatment. Performed at [facility], [place] [address], [place], [place] [address]          Radiology Studies: Dg Lumbar Spine Complete  Result Date: 06/06/2018 CLINICAL DATA:  Central laminectomy EXAM: LUMBAR SPINE - COMPLETE 4+ VIEW; DG C-ARM 61-120 MIN COMPARISON:  Radiograph 01/18/2018, MRI 12/28/2017 FINDINGS: Four low resolution intraoperative spot views of the lumbar spine. Total fluoroscopy time was 1 minutes 35 seconds. Posterior stabilization rods and pedicle screws with interbody device at what appears to be L4-L5. IMPRESSION: Intraoperative fluoroscopic assistance provided during lumbar spine surgery. Electronically Signed   By: Donavan Foil M.D.   On: 06/06/2018 14:48  Dg Chest Port 1 View  Result Date: 06/07/2018 CLINICAL DATA:  Hypertension EXAM: PORTABLE CHEST 1 VIEW COMPARISON:  09/08/2017, CT chest 09/27/2017 FINDINGS: Post sternotomy changes. Mild bronchitic changes, chronic. No acute consolidation or effusion. Mild cardiomegaly. No pneumothorax. IMPRESSION: 1. Mild cardiomegaly without acute infiltrate or edema 2. Mild chronic bronchitic changes. Electronically Signed   By: Donavan Foil M.D.   On: 06/07/2018 22:32   Dg C-arm 1-60 Min  Result Date: 06/06/2018 CLINICAL DATA:  Central laminectomy EXAM: LUMBAR SPINE - COMPLETE 4+ VIEW; DG C-ARM 61-120 MIN COMPARISON:  Radiograph 01/18/2018, MRI 12/28/2017 FINDINGS: Four low resolution intraoperative spot views of the lumbar spine. Total  fluoroscopy time was 1 minutes 35 seconds. Posterior stabilization rods and pedicle screws with interbody device at what appears to be L4-L5. IMPRESSION: Intraoperative fluoroscopic assistance provided during lumbar spine surgery. Electronically Signed   By: Donavan Foil M.D.   On: 06/06/2018 14:48   Dg C-arm 1-60 Min  Result Date: 06/06/2018 CLINICAL DATA:  Central laminectomy EXAM: LUMBAR SPINE - COMPLETE 4+ VIEW; DG C-ARM 61-120 MIN COMPARISON:  Radiograph 01/18/2018, MRI 12/28/2017 FINDINGS: Four low resolution intraoperative spot views of the lumbar spine. Total fluoroscopy time was 1 minutes 35 seconds. Posterior stabilization rods and pedicle screws with interbody device at what appears to be L4-L5. IMPRESSION: Intraoperative fluoroscopic assistance provided during lumbar spine surgery. Electronically Signed   By: Donavan Foil M.D.   On: 06/06/2018 14:48   Dg C-arm 1-60 Min  Result Date: 06/06/2018 CLINICAL DATA:  Central laminectomy EXAM: LUMBAR SPINE - COMPLETE 4+ VIEW; DG C-ARM 61-120 MIN COMPARISON:  Radiograph 01/18/2018, MRI 12/28/2017 FINDINGS: Four low resolution intraoperative spot views of the lumbar spine. Total fluoroscopy time was 1 minutes 35 seconds. Posterior stabilization rods and pedicle screws with interbody device at what appears to be L4-L5. IMPRESSION: Intraoperative fluoroscopic assistance provided during lumbar spine surgery. Electronically Signed   By: Donavan Foil M.D.   On: 06/06/2018 14:48        Scheduled Meds: . sodium chloride   Intravenous Once  . acetaminophen  650 mg Oral Once  . aspirin  325 mg Oral QHS  . atorvastatin  20 mg Oral QHS  . buPROPion  150 mg Oral QHS  . diphenhydrAMINE  25 mg Oral Once  . docusate sodium  100 mg Oral BID  . memantine  28 mg Oral Daily   And  . donepezil  10 mg Oral Daily  . ezetimibe  5 mg Oral QHS  . HYDROcodone-acetaminophen  1 tablet Oral Q6H  . insulin aspart  0-15 Units Subcutaneous TID WC  . multivitamin  with minerals  1 tablet Oral Daily  . pantoprazole  80 mg Oral Daily  . pregabalin  75 mg Oral BID  . sertraline  50 mg Oral QHS  . sodium chloride flush  3 mL Intravenous Q12H  . tiZANidine  2 mg Oral QHS   Continuous Infusions: . sodium chloride 100 mL/hr at 06/07/18 1530  . sodium chloride 100 mL/hr at 06/07/18 0424  . methocarbamol (ROBAXIN) IV    . sodium chloride       LOS: 2 days    Time spent: 40 minutes    Idelia Caudell, MD [facility] Pager [phone]  If 7PM-7AM, please contact night-coverage [email or web address] Password [facility] 06/08/2018, 2:03 PM

## 2018-06-08 NOTE — Plan of Care (Signed)

## 2018-06-08 NOTE — Progress Notes (Signed)
Inpatient Rehabilitation-Admissions Coordinator   Met with pt and her son at the bedside as follow up from PM&R consult. Pt and son did not want any further information regarding CIR as pt wants SNF-specifically [facility]. AC has communicated family preferences with CM and SW. AC will sign off at this time. Please call if questions.   Jhonnie Garner, OTR/L  Rehab Admissions Coordinator  [phone] 06/08/2018 11:36 AM

## 2018-06-08 NOTE — Progress Notes (Signed)
     Subjective: 2 Days Post-Op Procedure(s) (LRB): CENTRAL LAMINECTOMY L2-3, L3-4, L4-5, BILATERAL PARTIAL HEMILAMINECTOMY L5-S1, RIGHT L4-5 TRANSFORAMINAL LUMBAR INTERBODY FUSION WITH CAGE, MPACT SCREWS AND RODS, VIVIGEN, LOCAL AND ALLOGRAFT BONE GRAFT (N/A) Awake, alert and oriented x 4. [facility] consulted due to low BP and history of CHF and Afib in the past, mild renal insufficiency.  BP is still mildly low and HGb is 7.9 this AM. I feel better this morning, when do you think I will leave the hospital. Right hand and wrist pain is better.   Patient reports pain as moderate.    Objective:   VITALS:  Temp:  [98.3 F (36.8 C)-99.3 F (37.4 C)] 98.7 F (37.1 C) (08/15 0433) Pulse Rate:  [72-85] 72 (08/15 0433) Resp:  [16-18] 16 (08/15 0433) BP: (89-156)/(47-73) 104/56 (08/15 0433) SpO2:  [93 %-98 %] 93 % (08/15 0433) Weight:  [104.5 kg] 104.5 kg (08/15 0433)  Neurologically intact ABD soft Neurovascular intact Sensation intact distally Intact pulses distally Dorsiflexion/Plantar flexion intact Incision: scant drainage   LABS Recent Labs    06/06/18 1316  06/07/18 0412 06/07/18 1929 06/08/18 0413  HGB 8.8*  --  8.4* 8.9* 7.9*  WBC  --    < > 12.7* 14.1* 11.1*  PLT  --    < > 185 175 150   < > = values in this interval not displayed.   Recent Labs    06/07/18 1929 06/08/18 0413  NA 140 141  K 4.7 4.7  CL 110 113*  CO2 22 23  BUN 28* 27*  CREATININE 1.71* 1.59*  GLUCOSE 122* 152*   No results for input(s): LABPT, INR in the last 72 hours. EKG with ST-T wave changes nonspecific but consider anterolateral ischemia.   Troponin is pending. BNP 611  Assessment/Plan: 2 Days Post-Op Procedure(s) (LRB): CENTRAL LAMINECTOMY L2-3, L3-4, L4-5, BILATERAL PARTIAL HEMILAMINECTOMY L5-S1, RIGHT L4-5 TRANSFORAMINAL LUMBAR INTERBODY FUSION WITH CAGE, MPACT SCREWS AND RODS, VIVIGEN, LOCAL AND ALLOGRAFT BONE GRAFT (N/A) Anemia due to blood loss from surgery. Low  blood pressure due to anemia.  Advance diet Up with therapy Discharge to SNF when medically stable. Consider transfusion if orthostatic BP and symptoms of low BP persist, will discuss with Medicine.   [name] 06/08/2018, 8:48 AMPatient ID: Cheryl Summers, female   DOB: 09-16-1941, 77 y.o.   MRN: [id number]

## 2018-06-08 NOTE — NC FL2 (Signed)
[place] LEVEL OF CARE SCREENING TOOL     IDENTIFICATION  Patient Name: Cheryl Summers Birthdate: 06-04-1941 Sex: female Admission Date (Current Location): 06/06/2018  [facility] and [place] Number:  [employer] and Address:  [facility], [place] [address] [place], [place] [address]      Provider Number: [id number]  Attending Physician Name and Address:  Jessy Oto, MD  Relative Name and Phone Number:       Current Level of Care: Hospital Recommended Level of Care: [place] Prior Approval Number:    Date Approved/Denied:   PASRR Number: [id number]  Discharge Plan: SNF    Current Diagnoses: Patient Active Problem List   Diagnosis Date Noted  . Renal insufficiency 06/07/2018  . Hypotension   . Spinal stenosis, lumbar region with neurogenic claudication 06/06/2018    Class: Acute  . Spondylolisthesis, lumbar region 06/06/2018    Class: Chronic  . Status post lumbar spinal fusion 06/06/2018  . Murmur, heart 02/24/2018  . Bradycardia 01/19/2018  . Coronary artery disease involving native coronary artery of native heart with angina pectoris ([place]) 05/29/2017  . Tobacco abuse 05/13/2017  . Hypertension 05/13/2017  . Hyperlipidemia 05/13/2017  . GERD (gastroesophageal reflux disease) 05/13/2017  . COPD (chronic obstructive pulmonary disease) ([place]) 05/13/2017  . Carotid artery stenosis 05/13/2017  . Chronic diastolic heart failure ([place]) 06/09/2015  . Hypertensive heart disease with heart failure ([place]) 06/09/2015  . PAF (paroxysmal atrial fibrillation) ([place]) 06/09/2015    Orientation RESPIRATION BLADDER Height & Weight     Self, Time, Situation, Place  Normal Continent Weight: 230 lb 6.1 oz (104.5 kg) Height:  5\' 4"  (162.6 cm)  BEHAVIORAL SYMPTOMS/MOOD NEUROLOGICAL BOWEL NUTRITION STATUS      Continent Diet(carb modified)  AMBULATORY STATUS COMMUNICATION OF NEEDS Skin   Limited Assist Verbally Surgical  wounds(on back foam dressing)                       Personal Care Assistance Level of Assistance  Bathing, Dressing Bathing Assistance: Limited assistance   Dressing Assistance: Limited assistance     Functional Limitations Info             SPECIAL CARE FACTORS FREQUENCY  PT (By licensed PT), OT (By licensed OT)     PT Frequency: 5/wk OT Frequency: 5/wk            Contractures      Additional Factors Info  Code Status, Allergies Code Status Info: FULL Allergies Info: NKA           Current Medications (06/08/2018):  This is the current hospital active medication list Current Facility-Administered Medications  Medication Dose Route Frequency Provider Last Rate Last Dose  . 0.9 %  sodium chloride infusion  250 mL Intravenous Continuous Jessy Oto, MD 100 mL/hr at 06/07/18 1530    . 0.9 %  sodium chloride infusion   Intravenous Continuous Jessy Oto, MD 100 mL/hr at 06/07/18 0424    . acetaminophen (TYLENOL) tablet 650 mg  650 mg Oral Q4H PRN Jessy Oto, MD   650 mg at 06/08/18 1005   Or  . acetaminophen (TYLENOL) suppository 650 mg  650 mg Rectal Q4H PRN Jessy Oto, MD      . albuterol (PROVENTIL) (2.5 MG/3ML) 0.083% nebulizer solution 2.5 mg  2.5 mg Nebulization Daily PRN Jessy Oto, MD      . alum & mag hydroxide-simeth (  MAALOX/MYLANTA) 200-200-20 MG/5ML suspension 30 mL  30 mL Oral Q6H PRN Jessy Oto, MD      . aspirin tablet 325 mg  325 mg Oral QHS Jessy Oto, MD   325 mg at 06/07/18 2229  . atorvastatin (LIPITOR) tablet 20 mg  20 mg Oral QHS Jessy Oto, MD   20 mg at 06/07/18 2230  . bisacodyl (DULCOLAX) EC tablet 5 mg  5 mg Oral Daily PRN Jessy Oto, MD      . buPROPion (WELLBUTRIN XL) 24 hr tablet 150 mg  150 mg Oral QHS Jessy Oto, MD   150 mg at 06/07/18 2233  . docusate sodium (COLACE) capsule 100 mg  100 mg Oral BID Jessy Oto, MD   100 mg at 06/08/18 1004  . memantine (NAMENDA XR) 24 hr capsule 28 mg  28 mg  Oral Daily Jessy Oto, MD   28 mg at 06/08/18 1004   And  . donepezil (ARICEPT) tablet 10 mg  10 mg Oral Daily Jessy Oto, MD   10 mg at 06/08/18 1004  . ezetimibe (ZETIA) tablet 5 mg  5 mg Oral QHS Jessy Oto, MD   5 mg at 06/07/18 2229  . HYDROcodone-acetaminophen (NORCO) 10-325 MG per tablet 2 tablet  2 tablet Oral Q4H PRN Jessy Oto, MD   2 tablet at 06/06/18 2125  . HYDROcodone-acetaminophen (NORCO) 7.5-325 MG per tablet 1 tablet  1 tablet Oral Q6H Jessy Oto, MD   1 tablet at 06/08/18 0612  . HYDROcodone-acetaminophen (NORCO) 7.5-325 MG per tablet 1 tablet  1 tablet Oral Q4H PRN Jessy Oto, MD      . insulin aspart (novoLOG) injection 0-15 Units  0-15 Units Subcutaneous TID WC Jessy Oto, MD   2 Units at 06/08/18 [id number]  . menthol-cetylpyridinium (CEPACOL) lozenge 3 mg  1 lozenge Oral PRN Jessy Oto, MD       Or  . phenol (CHLORASEPTIC) mouth spray 1 spray  1 spray Mouth/Throat PRN Jessy Oto, MD      . methocarbamol (ROBAXIN) tablet 500 mg  500 mg Oral Q6H PRN Jessy Oto, MD   500 mg at 06/07/18 2230   Or  . methocarbamol (ROBAXIN) 500 mg in dextrose 5 % 50 mL IVPB  500 mg Intravenous Q6H PRN Jessy Oto, MD      . morphine 2 MG/ML injection 1 mg  1 mg Intravenous Q3H PRN Jessy Oto, MD      . multivitamin with minerals tablet 1 tablet  1 tablet Oral Daily Jessy Oto, MD   1 tablet at 06/08/18 1004  . ondansetron (ZOFRAN) tablet 4 mg  4 mg Oral Q6H PRN Jessy Oto, MD       Or  . ondansetron [facility]) injection 4 mg  4 mg Intravenous Q6H PRN Jessy Oto, MD      . pantoprazole (PROTONIX) EC tablet 80 mg  80 mg Oral Daily Jessy Oto, MD   80 mg at 06/08/18 1004  . polyethylene glycol (MIRALAX / GLYCOLAX) packet 17 g  17 g Oral Daily PRN Jessy Oto, MD      . pregabalin (LYRICA) capsule 75 mg  75 mg Oral BID Jessy Oto, MD   75 mg at 06/08/18 1004  . sertraline (ZOLOFT) tablet 50 mg  50 mg Oral QHS Jessy Oto, MD   50 mg  at 06/07/18 2230  .  sodium chloride (OCEAN) 0.65 % nasal spray 1 spray  1 spray Each Nare PRN Jessy Oto, MD      . sodium chloride 0.45 % bolus 250 mL  250 mL Intravenous Once Jessy Oto, MD      . sodium chloride flush (NS) 0.9 % injection 3 mL  3 mL Intravenous Q12H Jessy Oto, MD      . sodium chloride flush (NS) 0.9 % injection 3 mL  3 mL Intravenous PRN Jessy Oto, MD      . sodium phosphate (FLEET) 7-19 GM/118ML enema 1 enema  1 enema Rectal Once PRN Jessy Oto, MD      . tiZANidine (ZANAFLEX) tablet 2 mg  2 mg Oral QHS Jessy Oto, MD   2 mg at 06/07/18 2230     Discharge Medications: Please see discharge summary for a list of discharge medications.  Relevant Imaging Results:  Relevant Lab Results:   Additional Information SS#: [id number]  Jorge Ny, LCSW

## 2018-06-09 DIAGNOSIS — D519 Vitamin B12 deficiency anemia, unspecified: Secondary | ICD-10-CM

## 2018-06-09 DIAGNOSIS — I5032 Chronic diastolic (congestive) heart failure: Secondary | ICD-10-CM

## 2018-06-09 DIAGNOSIS — D509 Iron deficiency anemia, unspecified: Secondary | ICD-10-CM

## 2018-06-09 LAB — URINALYSIS, ROUTINE W REFLEX MICROSCOPIC
BILIRUBIN URINE: NEGATIVE
Bacteria, UA: NONE SEEN
GLUCOSE, UA: 50 mg/dL — AB
HGB URINE DIPSTICK: NEGATIVE
Ketones, ur: NEGATIVE mg/dL
Nitrite: NEGATIVE
Protein, ur: NEGATIVE mg/dL
Specific Gravity, Urine: 1.017 (ref 1.005–1.030)
pH: 5 (ref 5.0–8.0)

## 2018-06-09 LAB — BASIC METABOLIC PANEL
Anion gap: 5 (ref 5–15)
BUN: 22 mg/dL (ref 8–23)
CALCIUM: 8 mg/dL — AB (ref 8.9–10.3)
CHLORIDE: 113 mmol/L — AB (ref 98–111)
CO2: 24 mmol/L (ref 22–32)
CREATININE: 1.43 mg/dL — AB (ref 0.44–1.00)
GFR calc Af Amer: 39 mL/min — ABNORMAL LOW (ref >60)
GFR calc non Af Amer: 34 mL/min — ABNORMAL LOW (ref >60)
GLUCOSE: 116 mg/dL — AB (ref 70–99)
Potassium: 4.2 mmol/L (ref 3.5–5.1)
Sodium: 142 mmol/L (ref 135–145)

## 2018-06-09 LAB — BPAM RBC
BLOOD PRODUCT EXPIRATION DATE: 201908222359
ISSUE DATE / TIME: 201908151536
Unit Type and Rh: 9500

## 2018-06-09 LAB — TYPE AND SCREEN
ABO/RH(D): O POS
Antibody Screen: NEGATIVE
UNIT DIVISION: 0

## 2018-06-09 LAB — GLUCOSE, CAPILLARY
GLUCOSE-CAPILLARY: 117 mg/dL — AB (ref 70–99)
GLUCOSE-CAPILLARY: 93 mg/dL (ref 70–99)
GLUCOSE-CAPILLARY: 144 mg/dL — AB (ref 70–99)
GLUCOSE-CAPILLARY: 118 mg/dL — AB (ref 70–99)

## 2018-06-09 MED ORDER — HYDROCODONE-ACETAMINOPHEN 5-325 MG PO TABS: 1.0000 | ORAL_TABLET | Freq: Four times a day (QID) | ORAL | 0 refills | Status: DC | PRN

## 2018-06-09 MED ORDER — DOCUSATE SODIUM 100 MG PO CAPS: 100.0000 mg | ORAL_CAPSULE | Freq: Two times a day (BID) | ORAL | 0 refills | Status: DC

## 2018-06-09 MED ORDER — FUROSEMIDE 10 MG/ML IJ SOLN
40.0000 mg | Freq: Every day | INTRAMUSCULAR | Status: DC
  Administered 2018-06-09 – 2018-06-10 (×2): 40 mg via INTRAVENOUS
  Filled 2018-06-09 (×2): qty 4

## 2018-06-09 MED ORDER — CYANOCOBALAMIN 1000 MCG/ML IJ SOLN
1000.0000 ug | Freq: Every day | INTRAMUSCULAR | Status: DC
  Administered 2018-06-09 – 2018-06-10 (×2): 1000 ug via INTRAMUSCULAR
  Filled 2018-06-09 (×2): qty 1

## 2018-06-09 MED ORDER — BISACODYL 10 MG RE SUPP
10.0000 mg | Freq: Once | RECTAL | Status: AC
  Administered 2018-06-09: 10 mg via RECTAL
  Filled 2018-06-09: qty 1

## 2018-06-09 MED ORDER — NA FERRIC GLUC CPLX IN SUCROSE 12.5 MG/ML IV SOLN
125.0000 mg | Freq: Once | INTRAVENOUS | Status: AC
  Administered 2018-06-09: 125 mg via INTRAVENOUS
  Filled 2018-06-09: qty 10

## 2018-06-09 NOTE — Plan of Care (Signed)

## 2018-06-09 NOTE — Clinical Social Work Note (Addendum)
Clinical Social Worker facilitated patient discharge including contacting patient family and facility to confirm patient discharge plans.  Clinical information faxed to facility and family agreeable with plan.  CSW arranged ambulance transport via [facility] to [facility] in [place].  RN [phone] to call for report prior to discharge.  Clinical Social Worker will sign off for now as social work intervention is no longer needed. Please consult [place] again if new need arises.  Loletha Grayer, MSW [phone]

## 2018-06-09 NOTE — Clinical Social Work Note (Signed)
Clinical Social Work Assessment  Patient Details  Name: Cheryl Summers MRN: [id number] Date of Birth: 07-Apr-1941  Date of referral:  06/09/18               Reason for consult:  Facility Placement                Permission sought to share information with:  Family Supports Permission granted to share information::  Yes, Verbal Permission Granted  Name::     Engineer, drilling::  [facility] [place]  Relationship::  daughter  Contact Information:  [phone]  Housing/Transportation Living arrangements for the past 2 months:  [place] of Information:  Patient Patient Interpreter Needed:  None Criminal Activity/Legal Involvement Pertinent to Current Situation/Hospitalization:  No - Comment as needed Significant Relationships:  Adult Children Lives with:  Self Do you feel safe going back to the place where you live?  No Need for family participation in patient care:  No (Coment)  Care giving concerns:  Pt is alert and oriented.    Social Worker assessment / plan:  CSW spoke with pt at bedside. Pt lives alone. Pt confirmed she would like to go to [facility] in [place]. CSW will follow up with pt after speaking with facility.  Employment status:  Retired Nurse, adult PT Recommendations:  [place] / Referral to community resources:  [place]  Patient/Family's Response to care:  Pt verbalized understanding of CSW role and expressed appreciation for support. Pt denies any concern regarding pt care at this time.   Patient/Family's Understanding of and Emotional Response to Diagnosis, Current Treatment, and Prognosis:  Pt understanding and realistic regarding physical limitations. Pt understands the need for SNF placement at d/c. Pt agreeable to SNF placement at d/c, at this time. Pt's responses emotionally appropriate during conversation with CSW. Pt denies any concern regarding treatment plan at this time. CSW  will continue to provide support and facilitate d/c needs.   Emotional Assessment Appearance:  Appears stated age Attitude/Demeanor/Rapport:  (Patient was appropriate) Affect (typically observed):  Accepting, Appropriate, Calm Orientation:  Oriented to Self, Oriented to Place, Oriented to  Time, Oriented to Situation Alcohol / Substance use:  Not Applicable Psych involvement (Current and /or in the community):  No (Comment)  Discharge Needs  Concerns to be addressed:  Basic Needs, Care Coordination Readmission within the last 30 days:  No Current discharge risk:  Dependent with Mobility Barriers to Discharge:  Continued Medical Work up   [employer], LCSW 06/09/2018, 11:59 AM

## 2018-06-09 NOTE — Progress Notes (Signed)
Subjective: Doing well.  Pain  Controlled.  C/o fatigue when up and ambulating.  No headache, nausea/vomiting.  appetite ok.     Objective: Vital signs in last 24 hours: Temp:  [98.6 F (37 C)-99.2 F (37.3 C)] 99.2 F (37.3 C) (08/16 0350) Pulse Rate:  [79-93] 83 (08/16 0350) Resp:  [16-20] 17 (08/16 0350) BP: (91-182)/(42-55) 154/52 (08/16 0350) SpO2:  [91 %-97 %] 94 % (08/16 0350) Weight:  [107.9 kg] 107.9 kg (08/16 0436)  Intake/Output from previous day: 08/15 0701 - 08/16 0700 In: 2601.1 [P.O.:720; I.V.:1436.1; Blood:445] Out: 300 [Urine:300] Intake/Output this shift: No intake/output data recorded.  Recent Labs    06/06/18 1316 06/07/18 0412 06/07/18 1929 06/08/18 0413 06/08/18 2323  HGB 8.8* 8.4* 8.9* 7.9* 8.2*  8.3*   Recent Labs    06/08/18 0413 06/08/18 1415 06/08/18 2323  WBC 11.1*  --  9.7  RBC 2.61* 2.48* 2.73*  HCT 25.9*  --  26.4*  26.3*  PLT 150  --  136*   Recent Labs    06/08/18 0413 06/08/18 2323  NA 141 142  K 4.7 4.2  CL 113* 113*  CO2 23 24  BUN 27* 22  CREATININE 1.59* 1.43*  GLUCOSE 152* 116*  CALCIUM 8.1* 8.0*   No results for input(s): LABPT, INR in the last 72 hours.  Exam: Alert and oriented.  Incision looks good.  No drainage or signs of infection.  Fair amount of bruising lower back.        Assessment/Plan: Will see what hospitalist says today.  Discharge to snf when medically stable.  Appreciate medicine help.    Benjiman Core 06/09/2018, 7:57 AM

## 2018-06-09 NOTE — Progress Notes (Signed)
PROGRESS NOTE    Cheryl Summers  ZOX:[id number] DOB: January 15, 1941 DOA: 06/06/2018 PCP: Nicholos Johns, MD (Confirm with patient/family/NH records and if not entered, this HAS to be entered at [facility] point of entry. "No PCP" if truly none.)   Brief Narrative: (Start on day 1 of progress note - keep it brief and live) 77 year old female with history of paroxysmal atrial fibrillation, chronic diastolic congestive heart failure, CAD/P CABG, hypertension, lumbar spinal stenosis underwent lumbar laminectomy with fusion.  Postoperative course was complicated by hypotension, spinal fluid leak.  Patient received 250 mL of normal saline bolus with improvement.  Patient lost about 650 mL blood loss during the surgery, 250 mL replaced from [employer].   Assessment & Plan:   Principal Problem:   Spinal stenosis, lumbar region with neurogenic claudication Active Problems:   Chronic diastolic heart failure (HCC)   PAF (paroxysmal atrial fibrillation) (HCC)   Spondylolisthesis, lumbar region   Status post lumbar spinal fusion   Renal insufficiency   Hypotension   ##Hypotension -Patient continues to have low normal blood pressure despite holding all the blood pressure medications -Give 1 L fluid bolus -No signs of infection on urinalysis or chest x-ray -Resolved  ##Acute renal failure  -secondary to ATN -Continue with IV fluids -Keep the blood pressure over 811 systolic -Improving  ##Acute blood loss anemia -patient's hemoglobin continues to trend down -Patient's baseline hemoglobin is 12 -Hemoglobin trended down to 7.9 today -Anemia work-showed low iron iron profile, B12-186, folate RBC, stool occult-no BM, reticulocyte count-2.3 - 1 unit of packed RBC on 06/08/2018 -IV iron -Vitamin B12 thousand micrograms IM daily -Hemoglobin improved to 8.3  - repeat CBC in the morning  ##Constipation -Continue with the Colace, MiraLAX  ##Leukocytosis -Most likely reactive -Rule out any underlying  infections with urinalysis, chest x-ray-no signs of infection -Normalized  ##Spinal fluid leak pain -improved  ##Spinal stenosis -S/P laminectomy L2-L3 L3-L4 L4-L5 -Improving with physical therapy -Plan to discharge the patient to SNF tomorrow  ##Chronic congestive heart failure diastolic -Given 1 dose of IV Lasix  ##Obesity -Patient has BMI 35-39 -Counseling done regarding diet and exercise  ##Diabetes mellitus -Follow-up on sliding scale insulin  ##Depression -On BuSpar  ##CAD/P CABG  -hold aspirin considering patient's hemoglobin trend down -Continue with the statin    DVT prophylaxis: Lovenox Code Status: (Full Family Communication: Patient  disposition Plan: SNF tomorrow    Subjective: Improved pain from yesterday.  Treated with physical therapy is able to walk 25 feet.  Patient's son is at bedside  Objective: Vitals:   06/08/18 2005 06/09/18 0350 06/09/18 0436 06/09/18 1037  BP: (!) 158/51 (!) 154/52    Pulse: 81 83    Resp: 16 17    Temp: 98.6 F (37 C) 99.2 F (37.3 C)    TempSrc: Oral Oral    SpO2: 91% 94%  96%  Weight:   107.9 kg   Height:        Intake/Output Summary (Last 24 hours) at 06/09/2018 1422 Last data filed at 06/09/2018 1304 Gross per 24 hour  Intake 2547.3 ml  Output -  Net 2547.3 ml   Filed Weights   06/07/18 0103 06/08/18 0433 06/09/18 0436  Weight: 98 kg 104.5 kg 107.9 kg    Examination:  General exam: Appears calm and comfortable  Respiratory system: Clear to auscultation. Respiratory effort normal. Cardiovascular system: S1 & S2 heard, RRR. No JVD, murmurs, rubs, gallops or clicks. No pedal edema. Gastrointestinal system: Abdomen is nondistended, soft and  nontender. No organomegaly or masses felt. Normal bowel sounds heard. Central nervous system: Alert and oriented. No focal neurological deficits. Extremities: Symmetric 5 x 5 power. Skin: No rashes, lesions or ulcers Psychiatry: Judgement and insight appear normal.  Mood & affect appropriate.     Data Reviewed: I have personally reviewed following labs and imaging studies  CBC: Recent Labs  Lab 06/06/18 1316 06/07/18 0412 06/07/18 1929 06/08/18 0413 06/08/18 2323  WBC  --  12.7* 14.1* 11.1* 9.7  NEUTROABS  --   --  10.8*  --   --   HGB 8.8* 8.4* 8.9* 7.9* 8.2*  8.3*  HCT 26.0* 28.3* 29.3* 25.9* 26.4*  26.3*  MCV  --  100.0 98.0 99.2 96.3  PLT  --  185 175 150 732*   Basic Metabolic Panel: Recent Labs  Lab 06/06/18 1316 06/07/18 0412 06/07/18 1929 06/08/18 0413 06/08/18 2323  NA 143 141 140 141 142  K 4.7 5.5* 4.7 4.7 4.2  CL  --  111 110 113* 113*  CO2  --  20* 22 23 24   GLUCOSE  --  129* 122* 152* 116*  BUN  --  28* 28* 27* 22  CREATININE  --  1.78* 1.71* 1.59* 1.43*  CALCIUM  --  8.2* 8.5* 8.1* 8.0*   GFR: Estimated Creatinine Clearance: 38.3 mL/min (A) (by C-G formula based on SCr of 1.43 mg/dL (H)). Liver Function Tests: No results for input(s): AST, ALT, ALKPHOS, BILITOT, PROT, ALBUMIN in the last 168 hours. No results for input(s): LIPASE, AMYLASE in the last 168 hours. No results for input(s): AMMONIA in the last 168 hours. Coagulation Profile: No results for input(s): INR, PROTIME in the last 168 hours. Cardiac Enzymes: Recent Labs  Lab 06/08/18 1019  TROPONINI 0.50*   BNP (last 3 results) No results for input(s): PROBNP in the last 8760 hours. HbA1C: Recent Labs    06/06/18 1932  HGBA1C 5.4   CBG: Recent Labs  Lab 06/08/18 1134 06/08/18 1612 06/08/18 2138 06/09/18 0653 06/09/18 1121  GLUCAP 123* 132* 104* 117* 93   Lipid Profile: No results for input(s): CHOL, HDL, LDLCALC, TRIG, CHOLHDL, LDLDIRECT in the last 72 hours. Thyroid Function Tests: No results for input(s): TSH, T4TOTAL, FREET4, T3FREE, THYROIDAB in the last 72 hours. Anemia Panel: Recent Labs    06/08/18 1415  VITAMINB12 186  TIBC 272  IRON 14*  RETICCTPCT 2.3   Sepsis Labs: No results for input(s): PROCALCITON,  LATICACIDVEN in the last 168 hours.  Recent Results (from the past 240 hour(s))  Surgical pcr screen     Status: None   Collection Time: 06/01/18  8:57 AM  Result Value Ref Range Status   MRSA, PCR NEGATIVE NEGATIVE Final   Staphylococcus aureus NEGATIVE NEGATIVE Final    Comment: (NOTE) The Xpert SA Assay (FDA approved for NASAL specimens in patients 68 years of age and older), is one component of a comprehensive surveillance program. It is not intended to diagnose infection nor to guide or monitor treatment. Performed at [facility], [place] [address], [place], [place] [address]          Radiology Studies: Dg Chest 2 View  Result Date: 06/08/2018 CLINICAL DATA:  Leukocytosis. EXAM: CHEST - 2 VIEW COMPARISON:  Radiograph of June 07, 2018. FINDINGS: Stable cardiomegaly. Status post coronary artery bypass graft. Stable mild central pulmonary vascular congestion is noted. No pneumothorax or pleural effusion is noted. No consolidative process is noted. Bony thorax is unremarkable. IMPRESSION: Stable cardiomegaly  with central pulmonary vascular congestion. Electronically Signed   By: Marijo Conception, M.D.   On: 06/08/2018 15:06   Dg Chest Port 1 View  Result Date: 06/07/2018 CLINICAL DATA:  Hypertension EXAM: PORTABLE CHEST 1 VIEW COMPARISON:  09/08/2017, CT chest 09/27/2017 FINDINGS: Post sternotomy changes. Mild bronchitic changes, chronic. No acute consolidation or effusion. Mild cardiomegaly. No pneumothorax. IMPRESSION: 1. Mild cardiomegaly without acute infiltrate or edema 2. Mild chronic bronchitic changes. Electronically Signed   By: Donavan Foil M.D.   On: 06/07/2018 22:32        Scheduled Meds: . atorvastatin  20 mg Oral QHS  . buPROPion  150 mg Oral QHS  . cyanocobalamin  1,000 mcg Intramuscular Daily  . docusate sodium  100 mg Oral BID  . memantine  28 mg Oral Daily   And  . donepezil  10 mg Oral Daily  . ezetimibe  5 mg Oral QHS  . furosemide  40 mg  Intravenous Daily  . HYDROcodone-acetaminophen  1 tablet Oral Q6H  . insulin aspart  0-15 Units Subcutaneous TID WC  . multivitamin with minerals  1 tablet Oral Daily  . pantoprazole  80 mg Oral Daily  . pregabalin  75 mg Oral BID  . sertraline  50 mg Oral QHS  . sodium chloride flush  3 mL Intravenous Q12H  . tiZANidine  2 mg Oral QHS   Continuous Infusions: . sodium chloride Stopped (06/09/18 1303)  . sodium chloride 100 mL/hr at 06/07/18 0424  . methocarbamol (ROBAXIN) IV    . sodium chloride       LOS: 3 days    Time spent: 40 minutes    Sayvon Arterberry, MD [facility] Pager [phone]  If 7PM-7AM, please contact night-coverage [email or web address] Password [email or web address] 06/09/2018, 2:22 PM

## 2018-06-09 NOTE — Clinical Social Work Note (Signed)
Per RN pt will be given a suppository. CSW continuing to follow for stabilty to d/c.   Loletha Grayer, MSW [phone]

## 2018-06-09 NOTE — Discharge Summary (Signed)
Patient ID: [name] MRN: [id number] DOB/AGE: Jun 10, 1941 77 y.o.  Admit date: 06/06/2018 Discharge date: 06/09/2018  Admission Diagnoses:  Principal Problem:   Spinal stenosis, lumbar region with neurogenic claudication Active Problems:   Chronic diastolic heart failure (HCC)   PAF (paroxysmal atrial fibrillation) (HCC)   Spondylolisthesis, lumbar region   Status post lumbar spinal fusion   Renal insufficiency   Hypotension   Discharge Diagnoses:  Principal Problem:   Spinal stenosis, lumbar region with neurogenic claudication Active Problems:   Chronic diastolic heart failure (HCC)   PAF (paroxysmal atrial fibrillation) (HCC)   Spondylolisthesis, lumbar region   Status post lumbar spinal fusion   Renal insufficiency   Hypotension  status post Procedure(s): CENTRAL LAMINECTOMY L2-3, L3-4, L4-5, BILATERAL PARTIAL HEMILAMINECTOMY L5-S1, RIGHT L4-5 TRANSFORAMINAL LUMBAR INTERBODY FUSION WITH CAGE, MPACT SCREWS AND RODS, VIVIGEN, LOCAL AND ALLOGRAFT BONE GRAFT  Past Medical History:  Diagnosis Date  . Anxiety   . Carotid artery stenosis, asymptomatic    50% ASYMPTOMATIC  RIGHT CAROTID STENOSIS  . Chronic diastolic heart failure ([place]) 06/09/2015  . COPD (chronic obstructive pulmonary disease) ([place])   . Depression   . Diabetes mellitus without complication ([place])   . Dysrhythmia   . Gastroesophageal reflux   . Hyperlipidemia   . Hypertension   . Hypertensive heart disease with heart failure ([place]) 06/09/2015  . PAF (paroxysmal atrial fibrillation) ([place]) 06/09/2015   Overview:  CHADS2 vasc score=6  . Renal insufficiency   . Tobacco abuse   . Tremors of nervous system     Surgeries: Procedure(s): CENTRAL LAMINECTOMY L2-3, L3-4, L4-5, BILATERAL PARTIAL HEMILAMINECTOMY L5-S1, RIGHT L4-5 TRANSFORAMINAL LUMBAR INTERBODY FUSION WITH CAGE, MPACT SCREWS AND RODS, VIVIGEN, LOCAL AND ALLOGRAFT BONE GRAFT on 06/06/2018   Consultants:   Discharged Condition:  Improved  Hospital Course: Cheryl Summers is an 77 y.o. female who was admitted 06/06/2018 for operative treatment of Spinal stenosis, lumbar region with neurogenic claudication. Patient failed conservative treatments (please see the history and physical for the specifics) and had severe unremitting pain that affects sleep, daily activities and work/hobbies. After pre-op clearance, the patient was taken to the operating room on 06/06/2018 and underwent  Procedure(s): CENTRAL LAMINECTOMY L2-3, L3-4, L4-5, BILATERAL PARTIAL HEMILAMINECTOMY L5-S1, RIGHT L4-5 TRANSFORAMINAL LUMBAR INTERBODY FUSION WITH CAGE, MPACT SCREWS AND RODS, VIVIGEN, LOCAL AND ALLOGRAFT BONE GRAFT.    Patient was given perioperative antibiotics:  Anti-infectives (From admission, onward)   Start     Dose/Rate Route Frequency Ordered Stop   06/06/18 2200  ceFAZolin (ANCEF) IVPB 2g/100 mL premix     2 g 200 mL/hr over 30 Minutes Intravenous  Once 06/06/18 1708 06/06/18 2150   06/06/18 0600  ceFAZolin (ANCEF) IVPB 2g/100 mL premix     2 g 200 mL/hr over 30 Minutes Intravenous On call to O.R. 06/06/18 6759 06/06/18 0809       Patient was given sequential compression devices and early ambulation to prevent DVT.   Patient benefited maximally from hospital stay and there were no complications. At the time of discharge, the patient was urinating/moving their bowels without difficulty, tolerating a regular diet, pain is controlled with oral pain medications and they have been cleared by PT/OT.   Recent vital signs:  Patient Vitals for the past 24 hrs:  BP Temp Temp src Pulse Resp SpO2 Weight  06/09/18 0436 - - - - - - 107.9 kg  06/09/18 0350 (!) 154/52 99.2 F (37.3 C) Oral 83 17 94 % -  06/08/18 2005 (!) 158/51 98.6 F (37 C) Oral 81 16 91 % -  06/08/18 1839 (!) 182/55 98.6 F (37 C) Oral 88 - 97 % -  06/08/18 1556 (!) 140/54 98.6 F (37 C) Oral 81 18 93 % -  06/08/18 1534 (!) 138/42 98.6 F (37 C) Oral 82 20 92 % -   06/08/18 1004 (!) 91/50 - - 93 - - -  06/08/18 1000 (!) 104/52 - - 79 - - -     Recent laboratory studies:  Recent Labs    06/08/18 0413 06/08/18 2323  WBC 11.1* 9.7  HGB 7.9* 8.2*  8.3*  HCT 25.9* 26.4*  26.3*  PLT 150 136*  NA 141 142  K 4.7 4.2  CL 113* 113*  CO2 23 24  BUN 27* 22  CREATININE 1.59* 1.43*  GLUCOSE 152* 116*  CALCIUM 8.1* 8.0*     Discharge Medications:   Allergies as of 06/09/2018   No Known Allergies     Medication List    STOP taking these medications   acetaminophen 650 MG CR tablet Commonly known as:  TYLENOL   GOODSENSE ASPIRIN 325 MG tablet Generic drug:  aspirin   multivitamin tablet     TAKE these medications   albuterol 108 (90 Base) MCG/ACT inhaler Commonly known as:  PROVENTIL HFA;VENTOLIN HFA Inhale 2 puffs into the lungs every 6 (six) hours as needed for wheezing or shortness of breath.   albuterol (2.5 MG/3ML) 0.083% nebulizer solution Commonly known as:  PROVENTIL Take 2.5 mg by nebulization daily as needed for wheezing or shortness of breath.   amLODipine 10 MG tablet Commonly known as:  NORVASC Take 10 mg by mouth daily.   atorvastatin 20 MG tablet Commonly known as:  LIPITOR Take 20 mg by mouth at bedtime.   budesonide-formoterol 160-4.5 MCG/ACT inhaler Commonly known as:  SYMBICORT Inhale 2 puffs into the lungs 2 (two) times daily as needed (shortness of breath).   buPROPion 150 MG 24 hr tablet Commonly known as:  WELLBUTRIN XL Take 150 mg by mouth at bedtime.   docusate sodium 100 MG capsule Commonly known as:  COLACE Take 1 capsule (100 mg total) by mouth 2 (two) times daily.   ezetimibe 10 MG tablet Commonly known as:  ZETIA Take 5 mg by mouth at bedtime.   FARXIGA 10 MG Tabs tablet Generic drug:  dapagliflozin propanediol Take 10 mg by mouth daily.   HYDROcodone-acetaminophen 5-325 MG tablet Commonly known as:  NORCO/VICODIN Take 1-2 tablets by mouth every 6 (six) hours as needed for moderate  pain. What changed:    how much to take  when to take this  reasons to take this  Another medication with the same name was removed. Continue taking this medication, and follow the directions you see here.   montelukast 10 MG tablet Commonly known as:  SINGULAIR Take 10 mg by mouth at bedtime.   NAMZARIC 28-10 MG Cp24 Generic drug:  Memantine HCl-Donepezil HCl Take 1 capsule by mouth daily.   nystatin-triamcinolone cream Commonly known as:  MYCOLOG II Apply 1 application topically daily as needed (skin bumps).   omeprazole 20 MG capsule Commonly known as:  PRILOSEC Take 20 mg by mouth daily as needed (acid reflux).   pregabalin 75 MG capsule Commonly known as:  LYRICA Take 75 mg by mouth 2 (two) times daily.   quinapril 40 MG tablet Commonly known as:  ACCUPRIL Take 40 mg by mouth daily.   sertraline 50 MG  tablet Commonly known as:  ZOLOFT Take 50 mg by mouth at bedtime.   sodium chloride 0.65 % Soln nasal spray Commonly known as:  OCEAN Place 1 spray into both nostrils as needed for congestion.   SOOTHE XP Soln Place 1 drop into both eyes daily as needed (dry eyes).   tiZANidine 2 MG tablet Commonly known as:  ZANAFLEX Take 2 mg by mouth at bedtime.   torsemide 20 MG tablet Commonly known as:  DEMADEX Take 1 tablet (20 mg total) by mouth daily.            Durable Medical Equipment  (From admission, onward)         Start     Ordered   06/06/18 1709  DME Walker rolling  Once    Question:  Patient needs a walker to treat with the following condition  Answer:  Spinal stenosis of lumbar region with neurogenic claudication   06/06/18 1708   06/06/18 1709  DME 3 n 1  Once     06/06/18 1708          Diagnostic Studies: Dg Chest 2 View  Result Date: 06/08/2018 CLINICAL DATA:  Leukocytosis. EXAM: CHEST - 2 VIEW COMPARISON:  Radiograph of June 07, 2018. FINDINGS: Stable cardiomegaly. Status post coronary artery bypass graft. Stable mild central  pulmonary vascular congestion is noted. No pneumothorax or pleural effusion is noted. No consolidative process is noted. Bony thorax is unremarkable. IMPRESSION: Stable cardiomegaly with central pulmonary vascular congestion. Electronically Signed   By: Marijo Conception, M.D.   On: 06/08/2018 15:06   Dg Lumbar Spine Complete  Result Date: 06/06/2018 CLINICAL DATA:  Central laminectomy EXAM: LUMBAR SPINE - COMPLETE 4+ VIEW; DG C-ARM 61-120 MIN COMPARISON:  Radiograph 01/18/2018, MRI 12/28/2017 FINDINGS: Four low resolution intraoperative spot views of the lumbar spine. Total fluoroscopy time was 1 minutes 35 seconds. Posterior stabilization rods and pedicle screws with interbody device at what appears to be L4-L5. IMPRESSION: Intraoperative fluoroscopic assistance provided during lumbar spine surgery. Electronically Signed   By: Donavan Foil M.D.   On: 06/06/2018 14:48   Dg Chest Port 1 View  Result Date: 06/07/2018 CLINICAL DATA:  Hypertension EXAM: PORTABLE CHEST 1 VIEW COMPARISON:  09/08/2017, CT chest 09/27/2017 FINDINGS: Post sternotomy changes. Mild bronchitic changes, chronic. No acute consolidation or effusion. Mild cardiomegaly. No pneumothorax. IMPRESSION: 1. Mild cardiomegaly without acute infiltrate or edema 2. Mild chronic bronchitic changes. Electronically Signed   By: Donavan Foil M.D.   On: 06/07/2018 22:32   Dg C-arm 1-60 Min  Result Date: 06/06/2018 CLINICAL DATA:  Central laminectomy EXAM: LUMBAR SPINE - COMPLETE 4+ VIEW; DG C-ARM 61-120 MIN COMPARISON:  Radiograph 01/18/2018, MRI 12/28/2017 FINDINGS: Four low resolution intraoperative spot views of the lumbar spine. Total fluoroscopy time was 1 minutes 35 seconds. Posterior stabilization rods and pedicle screws with interbody device at what appears to be L4-L5. IMPRESSION: Intraoperative fluoroscopic assistance provided during lumbar spine surgery. Electronically Signed   By: Donavan Foil M.D.   On: 06/06/2018 14:48   Dg C-arm 1-60  Min  Result Date: 06/06/2018 CLINICAL DATA:  Central laminectomy EXAM: LUMBAR SPINE - COMPLETE 4+ VIEW; DG C-ARM 61-120 MIN COMPARISON:  Radiograph 01/18/2018, MRI 12/28/2017 FINDINGS: Four low resolution intraoperative spot views of the lumbar spine. Total fluoroscopy time was 1 minutes 35 seconds. Posterior stabilization rods and pedicle screws with interbody device at what appears to be L4-L5. IMPRESSION: Intraoperative fluoroscopic assistance provided during lumbar spine surgery. Electronically Signed  By: Donavan Foil M.D.   On: 06/06/2018 14:48   Dg C-arm 1-60 Min  Result Date: 06/06/2018 CLINICAL DATA:  Central laminectomy EXAM: LUMBAR SPINE - COMPLETE 4+ VIEW; DG C-ARM 61-120 MIN COMPARISON:  Radiograph 01/18/2018, MRI 12/28/2017 FINDINGS: Four low resolution intraoperative spot views of the lumbar spine. Total fluoroscopy time was 1 minutes 35 seconds. Posterior stabilization rods and pedicle screws with interbody device at what appears to be L4-L5. IMPRESSION: Intraoperative fluoroscopic assistance provided during lumbar spine surgery. Electronically Signed   By: Donavan Foil M.D.   On: 06/06/2018 14:48      Follow-up Information    Jessy Oto, MD In 2 weeks.   Specialty:  Orthopedic Surgery Why:  For wound re-check Contact information: [place] [address] [phone]           Discharge Plan:  discharge to SNF  Disposition:     Signed: Benjiman Core for [name] MD [facility] 06/09/2018, 6:56 AM

## 2018-06-09 NOTE — Clinical Social Work Placement (Addendum)
   CLINICAL SOCIAL WORK PLACEMENT  NOTE  Date:  06/09/2018  Patient Details  Name: Cheryl Summers MRN: [id number] Date of Birth: 05/23/1941  Clinical Social Work is seeking post-discharge placement for this patient at the [place] level of care (*CSW will initial, date and re-position this form in  chart as items are completed):      Patient/family provided with [place] Work Department's list of facilities offering this level of care within the geographic area requested by the patient (or if unable, by the patient's family).  Yes   Patient/family informed of their freedom to choose among providers that offer the needed level of care, that participate in Medicare, Medicaid or managed care program needed by the patient, have an available bed and are willing to accept the patient.      Patient/family informed of [facility]'s ownership interest in [facility] and [facility], as well as of the fact that they are under no obligation to receive care at these facilities.  PASRR submitted to EDS on       PASRR number received on 06/08/18     Existing PASRR number confirmed on       FL2 transmitted to all facilities in geographic area requested by pt/family on 06/08/18     FL2 transmitted to all facilities within larger geographic area on       Patient informed that his/her managed care company has contracts with or will negotiate with certain facilities, including the following:        Yes   Patient/family informed of bed offers received.  Patient chooses bed at [facility], [facility]     Physician recommends and patient chooses bed at      Patient to be transferred to [place], [place] on .  Patient to be transferred to facility by [facility]     Patient family notified on of transfer.  Name of family member notified:  Pt is alert and oriented, responsible for self     PHYSICIAN       Additional Comment:     _______________________________________________ Eileen Stanford, LCSW 06/09/2018, 12:01 PM

## 2018-06-09 NOTE — Progress Notes (Signed)
PT Cancellation Note  Patient Details Name: Cheryl Summers MRN: [id number] DOB: September 02, 1941   Cancelled Treatment:    Reason Eval/Treat Not Completed: Patient declined, no reason specified. Pt declining mobility this AM. She reports she has been up since 3:30 am and just returned to bed. She would like to rest before d/c to SNF. Will check back as time allows if pt still present.   Benjiman Core, PTA Pager [phone] Acute Rehab  Allena Katz 06/09/2018, 11:55 AM

## 2018-06-09 NOTE — Clinical Social Work Note (Signed)
[employer] auth started for admission to [facility] in [place] today. Pt aware and agreeable.  Loletha Grayer, MSW [phone]

## 2018-06-10 DIAGNOSIS — J449 Chronic obstructive pulmonary disease, unspecified: Secondary | ICD-10-CM | POA: Diagnosis not present

## 2018-06-10 DIAGNOSIS — N289 Disorder of kidney and ureter, unspecified: Secondary | ICD-10-CM | POA: Diagnosis not present

## 2018-06-10 DIAGNOSIS — G8918 Other acute postprocedural pain: Secondary | ICD-10-CM | POA: Diagnosis not present

## 2018-06-10 DIAGNOSIS — I959 Hypotension, unspecified: Secondary | ICD-10-CM | POA: Diagnosis not present

## 2018-06-10 DIAGNOSIS — R262 Difficulty in walking, not elsewhere classified: Secondary | ICD-10-CM | POA: Diagnosis not present

## 2018-06-10 DIAGNOSIS — Z981 Arthrodesis status: Secondary | ICD-10-CM | POA: Diagnosis not present

## 2018-06-10 DIAGNOSIS — I480 Paroxysmal atrial fibrillation: Secondary | ICD-10-CM

## 2018-06-10 DIAGNOSIS — M2550 Pain in unspecified joint: Secondary | ICD-10-CM | POA: Diagnosis not present

## 2018-06-10 DIAGNOSIS — M48062 Spinal stenosis, lumbar region with neurogenic claudication: Secondary | ICD-10-CM | POA: Diagnosis not present

## 2018-06-10 DIAGNOSIS — M5489 Other dorsalgia: Secondary | ICD-10-CM | POA: Diagnosis not present

## 2018-06-10 DIAGNOSIS — M4316 Spondylolisthesis, lumbar region: Secondary | ICD-10-CM | POA: Diagnosis not present

## 2018-06-10 DIAGNOSIS — Z4889 Encounter for other specified surgical aftercare: Secondary | ICD-10-CM | POA: Diagnosis not present

## 2018-06-10 DIAGNOSIS — I9589 Other hypotension: Secondary | ICD-10-CM | POA: Diagnosis not present

## 2018-06-10 DIAGNOSIS — D649 Anemia, unspecified: Secondary | ICD-10-CM | POA: Diagnosis not present

## 2018-06-10 DIAGNOSIS — I10 Essential (primary) hypertension: Secondary | ICD-10-CM | POA: Diagnosis not present

## 2018-06-10 DIAGNOSIS — I5030 Unspecified diastolic (congestive) heart failure: Secondary | ICD-10-CM | POA: Diagnosis not present

## 2018-06-10 DIAGNOSIS — D509 Iron deficiency anemia, unspecified: Secondary | ICD-10-CM | POA: Diagnosis not present

## 2018-06-10 DIAGNOSIS — I5032 Chronic diastolic (congestive) heart failure: Secondary | ICD-10-CM | POA: Diagnosis not present

## 2018-06-10 DIAGNOSIS — Z7401 Bed confinement status: Secondary | ICD-10-CM | POA: Diagnosis not present

## 2018-06-10 DIAGNOSIS — D519 Vitamin B12 deficiency anemia, unspecified: Secondary | ICD-10-CM | POA: Diagnosis not present

## 2018-06-10 DIAGNOSIS — E861 Hypovolemia: Secondary | ICD-10-CM | POA: Diagnosis not present

## 2018-06-10 LAB — GLUCOSE, CAPILLARY
GLUCOSE-CAPILLARY: 105 mg/dL — AB (ref 70–99)
GLUCOSE-CAPILLARY: 127 mg/dL — AB (ref 70–99)

## 2018-06-10 LAB — CBC
HCT: 26.5 % — ABNORMAL LOW (ref 36.0–46.0)
HEMOGLOBIN: 8.2 g/dL — AB (ref 12.0–15.0)
MCH: 30 pg (ref 26.0–34.0)
MCHC: 30.9 g/dL (ref 30.0–36.0)
MCV: 97.1 fL (ref 78.0–100.0)
Platelets: 155 10*3/uL (ref 150–400)
RBC: 2.73 MIL/uL — ABNORMAL LOW (ref 3.87–5.11)
RDW: 15 % (ref 11.5–15.5)
WBC: 9 10*3/uL (ref 4.0–10.5)

## 2018-06-10 LAB — BASIC METABOLIC PANEL
ANION GAP: 10 (ref 5–15)
BUN: 23 mg/dL (ref 8–23)
CALCIUM: 8.4 mg/dL — AB (ref 8.9–10.3)
CHLORIDE: 110 mmol/L (ref 98–111)
CO2: 23 mmol/L (ref 22–32)
Creatinine, Ser: 1.35 mg/dL — ABNORMAL HIGH (ref 0.44–1.00)
GFR calc non Af Amer: 36 mL/min — ABNORMAL LOW (ref >60)
GFR, EST AFRICAN AMERICAN: 42 mL/min — AB (ref >60)
Glucose, Bld: 109 mg/dL — ABNORMAL HIGH (ref 70–99)
Potassium: 4 mmol/L (ref 3.5–5.1)
SODIUM: 143 mmol/L (ref 135–145)

## 2018-06-10 MED ORDER — POLYETHYLENE GLYCOL 3350 17 G PO PACK: 17.0000 g | PACK | Freq: Every day | ORAL | 0 refills | Status: DC

## 2018-06-10 MED ORDER — CYANOCOBALAMIN 1000 MCG/ML IJ SOLN: 1000.0000 ug | Freq: Every day | INTRAMUSCULAR | 0 refills | Status: AC

## 2018-06-10 NOTE — Discharge Summary (Signed)
Joni Fears, MD   Biagio Borg, PA-C [address] [place], [place]  [address]                             [phone]  PATIENT ID: Cheryl Summers        MRN:  [id number]          DOB/AGE: [birth year] / 77 y.o.    DISCHARGE SUMMARY  ADMISSION DATE:    06/06/2018 DISCHARGE DATE:   06/10/2018   ADMISSION DIAGNOSIS: L2-3 to L5-S1 spinal stenosis, L4-5 lumbar spondylolisthesis    DISCHARGE DIAGNOSIS:  L2-3 to L5-S1 spinal stenosis, L4-5 lumbar spondylolisthesis    ADDITIONAL DIAGNOSIS: Principal Problem:   Spinal stenosis, lumbar region with neurogenic claudication Active Problems:   Chronic diastolic heart failure (HCC)   PAF (paroxysmal atrial fibrillation) (HCC)   Spondylolisthesis, lumbar region   Status post lumbar spinal fusion   Renal insufficiency   Hypotension  Past Medical History:  Diagnosis Date  . Anxiety   . Carotid artery stenosis, asymptomatic    50% ASYMPTOMATIC  RIGHT CAROTID STENOSIS  . Chronic diastolic heart failure ([place]) 06/09/2015  . COPD (chronic obstructive pulmonary disease) ([place])   . Depression   . Diabetes mellitus without complication ([place])   . Dysrhythmia   . Gastroesophageal reflux   . Hyperlipidemia   . Hypertension   . Hypertensive heart disease with heart failure ([place]) 06/09/2015  . PAF (paroxysmal atrial fibrillation) ([place]) 06/09/2015   Overview:  CHADS2 vasc score=6  . Renal insufficiency   . Tobacco abuse   . Tremors of nervous system     PROCEDURE: Procedure(s): CENTRAL LAMINECTOMY L2-3, L3-4, L4-5, BILATERAL PARTIAL HEMILAMINECTOMY L5-S1, RIGHT L4-5 TRANSFORAMINAL LUMBAR INTERBODY FUSION WITH CAGE, MPACT SCREWS AND RODS, VIVIGEN, LOCAL AND ALLOGRAFT BONE GRAFT  on 06/06/2018  CONSULTS: hospitalist    HISTORY: [age]-year-old female history of lumbar stenosis and HNP and the above complaint presents for surgical intervention.  Progressively worsening symptoms.  Failed conservative treatment  HOSPITAL COURSE:  Cheryl Summers  is a 77 y.o. admitted on 06/06/2018 and found to have a diagnosis of L2-3 to L5-S1 spinal stenosis, L4-5 lumbar spondylolisthesis.  After appropriate laboratory studies were obtained  they were taken to the operating room on 06/06/2018 and underwent  Procedure(s): CENTRAL LAMINECTOMY L2-3, L3-4, L4-5, BILATERAL PARTIAL HEMILAMINECTOMY L5-S1, RIGHT L4-5 TRANSFORAMINAL LUMBAR INTERBODY FUSION WITH CAGE, MPACT SCREWS AND RODS, VIVIGEN, LOCAL AND ALLOGRAFT BONE GRAFT  .   They were given perioperative antibiotics:  Anti-infectives (From admission, onward)   Start     Dose/Rate Route Frequency Ordered Stop   06/06/18 2200  ceFAZolin (ANCEF) IVPB 2g/100 mL premix     2 g 200 mL/hr over 30 Minutes Intravenous  Once 06/06/18 1708 06/06/18 2150   06/06/18 0600  ceFAZolin (ANCEF) IVPB 2g/100 mL premix     2 g 200 mL/hr over 30 Minutes Intravenous On call to O.R. 06/06/18 7169 06/06/18 0809    .  Tolerated the procedure well.   Cheryl Summers is an 77 y.o. female who was admitted 06/06/2018 for operative treatment of Spinal stenosis, lumbar region with neurogenic claudication. Patient failed conservative treatments (please see the history and physical for the specifics) and had severe unremitting pain that affects sleep, daily activities and work/hobbies. After pre-op clearance, the patient was taken to the operating room on 06/06/2018 and underwent  Procedure(s): CENTRAL LAMINECTOMY L2-3, L3-4, L4-5, BILATERAL PARTIAL HEMILAMINECTOMY L5-S1, RIGHT  L4-5 TRANSFORAMINAL LUMBAR INTERBODY FUSION WITH CAGE, MPACT SCREWS AND RODS, VIVIGEN, LOCAL AND ALLOGRAFT BONE GRAFT.     Patient was given sequential compression devices and early ambulation to prevent DVT.   Patient benefited maximally from hospital stay and there were no complications. At the time of discharge, the patient was urinating/moving their bowels without difficulty, tolerating a regular diet, pain is controlled with oral pain medications and they have  been cleared by PT/OT.      The remainder of the hospital course was dedicated to ambulation and strengthening.   The patient was discharged on 4 Days Post-Op in  Stable condition.  Blood products given:none  DIAGNOSTIC STUDIES: Recent vital signs:  Patient Vitals for the past 24 hrs:  BP Temp Temp src Pulse SpO2 Weight  06/10/18 0533 (!) 161/50 98.6 F (37 C) Oral 72 94 % 104.7 kg  06/09/18 2004 (!) 159/44 99.4 F (37.4 C) Oral 85 96 % -  06/09/18 1451 96/80 99.1 F (37.3 C) Oral 83 93 % -       Recent laboratory studies: Recent Labs    06/06/18 1316 06/07/18 0412 06/07/18 1929 06/08/18 0413 06/08/18 2323 06/10/18 0336  WBC  --  12.7* 14.1* 11.1* 9.7 9.0  HGB 8.8* 8.4* 8.9* 7.9* 8.2*  8.3* 8.2*  HCT 26.0* 28.3* 29.3* 25.9* 26.4*  26.3* 26.5*  PLT  --  185 175 150 136* 155   Recent Labs    06/06/18 1316 06/07/18 0412 06/07/18 1929 06/08/18 0413 06/08/18 2323 06/10/18 0336  NA 143 141 140 141 142 143  K 4.7 5.5* 4.7 4.7 4.2 4.0  CL  --  111 110 113* 113* 110  CO2  --  20* 22 23 24 23   BUN  --  28* 28* 27* 22 23  CREATININE  --  1.78* 1.71* 1.59* 1.43* 1.35*  GLUCOSE  --  129* 122* 152* 116* 109*  CALCIUM  --  8.2* 8.5* 8.1* 8.0* 8.4*   Lab Results  Component Value Date   INR 0.94 06/01/2018     Recent Radiographic Studies :  Dg Chest 2 View  Result Date: 06/08/2018 CLINICAL DATA:  Leukocytosis. EXAM: CHEST - 2 VIEW COMPARISON:  Radiograph of June 07, 2018. FINDINGS: Stable cardiomegaly. Status post coronary artery bypass graft. Stable mild central pulmonary vascular congestion is noted. No pneumothorax or pleural effusion is noted. No consolidative process is noted. Bony thorax is unremarkable. IMPRESSION: Stable cardiomegaly with central pulmonary vascular congestion. Electronically Signed   By: Marijo Conception, M.D.   On: 06/08/2018 15:06   Dg Lumbar Spine Complete  Result Date: 06/06/2018 CLINICAL DATA:  Central laminectomy EXAM: LUMBAR SPINE -  COMPLETE 4+ VIEW; DG C-ARM 61-120 MIN COMPARISON:  Radiograph 01/18/2018, MRI 12/28/2017 FINDINGS: Four low resolution intraoperative spot views of the lumbar spine. Total fluoroscopy time was 1 minutes 35 seconds. Posterior stabilization rods and pedicle screws with interbody device at what appears to be L4-L5. IMPRESSION: Intraoperative fluoroscopic assistance provided during lumbar spine surgery. Electronically Signed   By: Donavan Foil M.D.   On: 06/06/2018 14:48   Dg Chest Port 1 View  Result Date: 06/07/2018 CLINICAL DATA:  Hypertension EXAM: PORTABLE CHEST 1 VIEW COMPARISON:  09/08/2017, CT chest 09/27/2017 FINDINGS: Post sternotomy changes. Mild bronchitic changes, chronic. No acute consolidation or effusion. Mild cardiomegaly. No pneumothorax. IMPRESSION: 1. Mild cardiomegaly without acute infiltrate or edema 2. Mild chronic bronchitic changes. Electronically Signed   By: Donavan Foil M.D.   On: 06/07/2018  22:32   Dg C-arm 1-60 Min  Result Date: 06/06/2018 CLINICAL DATA:  Central laminectomy EXAM: LUMBAR SPINE - COMPLETE 4+ VIEW; DG C-ARM 61-120 MIN COMPARISON:  Radiograph 01/18/2018, MRI 12/28/2017 FINDINGS: Four low resolution intraoperative spot views of the lumbar spine. Total fluoroscopy time was 1 minutes 35 seconds. Posterior stabilization rods and pedicle screws with interbody device at what appears to be L4-L5. IMPRESSION: Intraoperative fluoroscopic assistance provided during lumbar spine surgery. Electronically Signed   By: Donavan Foil M.D.   On: 06/06/2018 14:48   Dg C-arm 1-60 Min  Result Date: 06/06/2018 CLINICAL DATA:  Central laminectomy EXAM: LUMBAR SPINE - COMPLETE 4+ VIEW; DG C-ARM 61-120 MIN COMPARISON:  Radiograph 01/18/2018, MRI 12/28/2017 FINDINGS: Four low resolution intraoperative spot views of the lumbar spine. Total fluoroscopy time was 1 minutes 35 seconds. Posterior stabilization rods and pedicle screws with interbody device at what appears to be L4-L5.  IMPRESSION: Intraoperative fluoroscopic assistance provided during lumbar spine surgery. Electronically Signed   By: Donavan Foil M.D.   On: 06/06/2018 14:48   Dg C-arm 1-60 Min  Result Date: 06/06/2018 CLINICAL DATA:  Central laminectomy EXAM: LUMBAR SPINE - COMPLETE 4+ VIEW; DG C-ARM 61-120 MIN COMPARISON:  Radiograph 01/18/2018, MRI 12/28/2017 FINDINGS: Four low resolution intraoperative spot views of the lumbar spine. Total fluoroscopy time was 1 minutes 35 seconds. Posterior stabilization rods and pedicle screws with interbody device at what appears to be L4-L5. IMPRESSION: Intraoperative fluoroscopic assistance provided during lumbar spine surgery. Electronically Signed   By: Donavan Foil M.D.   On: 06/06/2018 14:48    DISCHARGE INSTRUCTIONS:   DISCHARGE MEDICATIONS:   Allergies as of 06/10/2018   No Known Allergies     Medication List    STOP taking these medications   acetaminophen 650 MG CR tablet Commonly known as:  TYLENOL   GOODSENSE ASPIRIN 325 MG tablet Generic drug:  aspirin   multivitamin tablet     TAKE these medications   albuterol 108 (90 Base) MCG/ACT inhaler Commonly known as:  PROVENTIL HFA;VENTOLIN HFA Inhale 2 puffs into the lungs every 6 (six) hours as needed for wheezing or shortness of breath.   albuterol (2.5 MG/3ML) 0.083% nebulizer solution Commonly known as:  PROVENTIL Take 2.5 mg by nebulization daily as needed for wheezing or shortness of breath.   amLODipine 10 MG tablet Commonly known as:  NORVASC Take 10 mg by mouth daily.   atorvastatin 20 MG tablet Commonly known as:  LIPITOR Take 20 mg by mouth at bedtime.   budesonide-formoterol 160-4.5 MCG/ACT inhaler Commonly known as:  SYMBICORT Inhale 2 puffs into the lungs 2 (two) times daily as needed (shortness of breath).   buPROPion 150 MG 24 hr tablet Commonly known as:  WELLBUTRIN XL Take 150 mg by mouth at bedtime.   docusate sodium 100 MG capsule Commonly known as:   COLACE Take 1 capsule (100 mg total) by mouth 2 (two) times daily.   ezetimibe 10 MG tablet Commonly known as:  ZETIA Take 5 mg by mouth at bedtime.   FARXIGA 10 MG Tabs tablet Generic drug:  dapagliflozin propanediol Take 10 mg by mouth daily.   HYDROcodone-acetaminophen 5-325 MG tablet Commonly known as:  NORCO/VICODIN Take 1-2 tablets by mouth every 6 (six) hours as needed for moderate pain. What changed:    how much to take  when to take this  reasons to take this  Another medication with the same name was removed. Continue taking this medication, and follow the directions  you see here.   montelukast 10 MG tablet Commonly known as:  SINGULAIR Take 10 mg by mouth at bedtime.   NAMZARIC 28-10 MG Cp24 Generic drug:  Memantine HCl-Donepezil HCl Take 1 capsule by mouth daily.   nystatin-triamcinolone cream Commonly known as:  MYCOLOG II Apply 1 application topically daily as needed (skin bumps).   omeprazole 20 MG capsule Commonly known as:  PRILOSEC Take 20 mg by mouth daily as needed (acid reflux).   pregabalin 75 MG capsule Commonly known as:  LYRICA Take 75 mg by mouth 2 (two) times daily.   quinapril 40 MG tablet Commonly known as:  ACCUPRIL Take 40 mg by mouth daily.   sertraline 50 MG tablet Commonly known as:  ZOLOFT Take 50 mg by mouth at bedtime.   sodium chloride 0.65 % Soln nasal spray Commonly known as:  OCEAN Place 1 spray into both nostrils as needed for congestion.   SOOTHE XP Soln Place 1 drop into both eyes daily as needed (dry eyes).   tiZANidine 2 MG tablet Commonly known as:  ZANAFLEX Take 2 mg by mouth at bedtime.   torsemide 20 MG tablet Commonly known as:  DEMADEX Take 1 tablet (20 mg total) by mouth daily.            Durable Medical Equipment  (From admission, onward)         Start     Ordered   06/06/18 1709  DME Walker rolling  Once    Question:  Patient needs a walker to treat with the following condition   Answer:  Spinal stenosis of lumbar region with neurogenic claudication   06/06/18 1708   06/06/18 1709  DME 3 n 1  Once     06/06/18 1708          FOLLOW UP VISIT:    Contact information for follow-up providers    Jessy Oto, MD In 2 weeks.   Specialty:  Orthopedic Surgery Why:  For wound re-check Contact information: [place] [address] [phone]            Contact information for after-discharge care    Destination    [facility] .   Service:  Skilled Nursing Contact information: [place] [phone]                  DISPOSITION:   Skilled Nursing Facility/Rehab  CONDITION:  Stable   Mike Craze. [place], [place] [phone]  06/10/2018 10:44 AM

## 2018-06-10 NOTE — Progress Notes (Signed)
AVS, printed prescription, and social worker's paperwork placed in discharge packet for [facility]. Report given to Larena Glassman, RN at [facility]. Awaiting [facility] for transportation. Will continue to monitor.

## 2018-06-10 NOTE — Progress Notes (Signed)
PROGRESS NOTE    Cheryl Summers  FTD:[id number] DOB: [birth year] DOA: 06/06/2018 PCP: Nicholos Johns, MD (Confirm with patient/family/NH records and if not entered, this HAS to be entered at [facility] point of entry. "No PCP" if truly none.)   Brief Narrative:  77 year old female with history of paroxysmal atrial fibrillation, chronic diastolic congestive heart failure, CAD/P CABG, hypertension, lumbar spinal stenosis underwent lumbar laminectomy with fusion.  Postoperative course was complicated by hypotension, spinal fluid leak.  Patient received 250 mL of normal saline bolus with improvement.  Patient lost about 650 mL blood loss during the surgery, 250 mL replaced from [facility].  Patient received 1 unit of packed RBC when hemoglobin trended down to 8.  However patient's hemoglobin remained at 8.2.  Work-up done in regards to anemia, patient has iron deficiency, B12 deficiency anemia.  Patient was given 1 dose of IV iron.  Patient will continue with B12 injections intramuscular for 7 days, followed by every week for 4 weeks, followed by monthly.  Patient will need to continue monitoring B12 levels.  Patient developed constipation with a good improvement with the laxatives, stool softeners.  Patient was found to have acute renal failure most likely from ATN from hypotension improved at the time of the discharge.  Patient was found to have leukocytosis, however no obvious source of infection is noted.  Counseling done regarding diet and exercise concerning about patient's morbid obesity, expressed understanding.   Assessment & Plan:   Principal Problem:   Spinal stenosis, lumbar region with neurogenic claudication Active Problems:   Chronic diastolic heart failure (HCC)   PAF (paroxysmal atrial fibrillation) (HCC)   Spondylolisthesis, lumbar region   Status post lumbar spinal fusion   Renal insufficiency   Hypotension   ##Hypotension -Patient continues to have low normal blood pressure despite  holding all the blood pressure medications -Give 1 L fluid bolus -No signs of infection on urinalysis or chest x-ray -Resolved  ##Acute renal failure  -secondary to ATN -Continue with IV fluids -Keep the blood pressure over 062 systolic -Improving  ##Acute blood loss anemia -patient's hemoglobin continues to trend down -Patient's baseline hemoglobin is 12 -Hemoglobin trended down to 7.9 today -Anemia work-showed low iron iron profile, B12-186, folate RBC, stool occult-no BM, reticulocyte count-2.3 - 1 unit of packed RBC on 06/08/2018 -IV iron -Vitamin B12 thousand micrograms IM daily -Hemoglobin improved to 8.3  - repeat CBC in the morning  ##Constipation -Continue with the Colace, MiraLAX  ##Leukocytosis -Most likely reactive -Rule out any underlying infections with urinalysis, chest x-ray-no signs of infection -Normalized  ##Spinal fluid leak pain -improved  ##Spinal stenosis -S/P laminectomy L2-L3 L3-L4 L4-L5 -Improving with physical therapy -Plan to discharge the patient to SNF tomorrow  ##Chronic congestive heart failure diastolic -Given 1 dose of IV Lasix  ##Obesity -Patient has BMI 35-39 -Counseling done regarding diet and exercise  ##Diabetes mellitus -Follow-up on sliding scale insulin  ##Depression -On BuSpar  ##CAD/P CABG  -hold aspirin considering patient's hemoglobin trend down -Continue with the statin    DVT prophylaxis: Lovenox Code Status: (Full Family Communication: Patient  disposition Plan: SNF today   Subjective: Improved pain from yesterday.  Treated with physical therapy is able to walk 25 feet.  Patient's son is at bedside  Objective: Vitals:   06/09/18 1037 06/09/18 1451 06/09/18 2004 06/10/18 0533  BP:  96/80 (!) 159/44 (!) 161/50  Pulse:  83 85 72  Resp:      Temp:  99.1 F (37.3 C) 99.4  F (37.4 C) 98.6 F (37 C)  TempSrc:  Oral Oral Oral  SpO2: 96% 93% 96% 94%  Weight:    104.7 kg  Height:       No intake or  output data in the 24 hours ending 06/10/18 1444 Filed Weights   06/08/18 0433 06/09/18 0436 06/10/18 0533  Weight: 104.5 kg 107.9 kg 104.7 kg    Examination:  General exam: Appears calm and comfortable  Respiratory system: Clear to auscultation. Respiratory effort normal. Cardiovascular system: S1 & S2 heard, RRR. No JVD, murmurs, rubs, gallops or clicks. No pedal edema. Gastrointestinal system: Abdomen is nondistended, soft and nontender. No organomegaly or masses felt. Normal bowel sounds heard. Central nervous system: Alert and oriented. No focal neurological deficits. Extremities: Symmetric 5 x 5 power. Skin: No rashes, lesions or ulcers Psychiatry: Judgement and insight appear normal. Mood & affect appropriate.     Data Reviewed: I have personally reviewed following labs and imaging studies  CBC: Recent Labs  Lab 06/07/18 0412 06/07/18 1929 06/08/18 0413 06/08/18 2323 06/10/18 0336  WBC 12.7* 14.1* 11.1* 9.7 9.0  NEUTROABS  --  10.8*  --   --   --   HGB 8.4* 8.9* 7.9* 8.2*  8.3* 8.2*  HCT 28.3* 29.3* 25.9* 26.4*  26.3* 26.5*  MCV 100.0 98.0 99.2 96.3 97.1  PLT 185 175 150 136* 681   Basic Metabolic Panel: Recent Labs  Lab 06/07/18 0412 06/07/18 1929 06/08/18 0413 06/08/18 2323 06/10/18 0336  NA 141 140 141 142 143  K 5.5* 4.7 4.7 4.2 4.0  CL 111 110 113* 113* 110  CO2 20* 22 23 24 23   GLUCOSE 129* 122* 152* 116* 109*  BUN 28* 28* 27* 22 23  CREATININE 1.78* 1.71* 1.59* 1.43* 1.35*  CALCIUM 8.2* 8.5* 8.1* 8.0* 8.4*   GFR: Estimated Creatinine Clearance: 39.8 mL/min (A) (by C-G formula based on SCr of 1.35 mg/dL (H)). Liver Function Tests: No results for input(s): AST, ALT, ALKPHOS, BILITOT, PROT, ALBUMIN in the last 168 hours. No results for input(s): LIPASE, AMYLASE in the last 168 hours. No results for input(s): AMMONIA in the last 168 hours. Coagulation Profile: No results for input(s): INR, PROTIME in the last 168 hours. Cardiac Enzymes: Recent  Labs  Lab 06/08/18 1019  TROPONINI 0.50*   BNP (last 3 results) No results for input(s): PROBNP in the last 8760 hours. HbA1C: No results for input(s): HGBA1C in the last 72 hours. CBG: Recent Labs  Lab 06/09/18 1121 06/09/18 1703 06/09/18 2125 06/10/18 0618 06/10/18 1131  GLUCAP 93 144* 118* 105* 127*   Lipid Profile: No results for input(s): CHOL, HDL, LDLCALC, TRIG, CHOLHDL, LDLDIRECT in the last 72 hours. Thyroid Function Tests: No results for input(s): TSH, T4TOTAL, FREET4, T3FREE, THYROIDAB in the last 72 hours. Anemia Panel: Recent Labs    06/08/18 1415  VITAMINB12 186  TIBC 272  IRON 14*  RETICCTPCT 2.3   Sepsis Labs: No results for input(s): PROCALCITON, LATICACIDVEN in the last 168 hours.  Recent Results (from the past 240 hour(s))  Surgical pcr screen     Status: None   Collection Time: 06/01/18  8:57 AM  Result Value Ref Range Status   MRSA, PCR NEGATIVE NEGATIVE Final   Staphylococcus aureus NEGATIVE NEGATIVE Final    Comment: (NOTE) The Xpert SA Assay (FDA approved for NASAL specimens in patients 92 years of age and older), is one component of a comprehensive surveillance program. It is not intended to diagnose infection nor to  guide or monitor treatment. Performed at [facility], [place] [address], [place], [place] [address]          Radiology Studies: Dg Chest 2 View  Result Date: 06/08/2018 CLINICAL DATA:  Leukocytosis. EXAM: CHEST - 2 VIEW COMPARISON:  Radiograph of June 07, 2018. FINDINGS: Stable cardiomegaly. Status post coronary artery bypass graft. Stable mild central pulmonary vascular congestion is noted. No pneumothorax or pleural effusion is noted. No consolidative process is noted. Bony thorax is unremarkable. IMPRESSION: Stable cardiomegaly with central pulmonary vascular congestion. Electronically Signed   By: Marijo Conception, M.D.   On: 06/08/2018 15:06        Scheduled Meds: . atorvastatin  20 mg Oral QHS  .  betamethasone acetate-betamethasone sodium phosphate  12 mg Other Once  . buPROPion  150 mg Oral QHS  . cyanocobalamin  1,000 mcg Intramuscular Daily  . docusate sodium  100 mg Oral BID  . memantine  28 mg Oral Daily   And  . donepezil  10 mg Oral Daily  . ezetimibe  5 mg Oral QHS  . furosemide  40 mg Intravenous Daily  . HYDROcodone-acetaminophen  1 tablet Oral Q6H  . insulin aspart  0-15 Units Subcutaneous TID WC  . multivitamin with minerals  1 tablet Oral Daily  . pantoprazole  80 mg Oral Daily  . pregabalin  75 mg Oral BID  . sertraline  50 mg Oral QHS  . sodium chloride flush  3 mL Intravenous Q12H  . tiZANidine  2 mg Oral QHS   Continuous Infusions: . sodium chloride Stopped (06/09/18 1303)  . methocarbamol (ROBAXIN) IV    . sodium chloride       LOS: 4 days    Time spent: 40 minutes    Janiqua Friscia, MD [facility] Pager [phone]  If 7PM-7AM, please contact night-coverage [email or web address] Password [facility] 06/10/2018, 2:44 PM

## 2018-06-10 NOTE — Clinical Social Work Note (Addendum)
MD please update date on summary. Facility prepared to take pt today.  Loletha Grayer, MSW [phone]

## 2018-06-13 DIAGNOSIS — D649 Anemia, unspecified: Secondary | ICD-10-CM | POA: Diagnosis not present

## 2018-06-13 DIAGNOSIS — R262 Difficulty in walking, not elsewhere classified: Secondary | ICD-10-CM | POA: Diagnosis not present

## 2018-06-13 DIAGNOSIS — G8918 Other acute postprocedural pain: Secondary | ICD-10-CM | POA: Diagnosis not present

## 2018-06-13 DIAGNOSIS — Z4889 Encounter for other specified surgical aftercare: Secondary | ICD-10-CM | POA: Diagnosis not present

## 2018-06-21 ENCOUNTER — Encounter (INDEPENDENT_AMBULATORY_CARE_PROVIDER_SITE_OTHER): Payer: Self-pay | Admitting: Specialist

## 2018-06-21 ENCOUNTER — Telehealth (INDEPENDENT_AMBULATORY_CARE_PROVIDER_SITE_OTHER): Payer: Self-pay | Admitting: Specialist

## 2018-06-21 ENCOUNTER — Ambulatory Visit (INDEPENDENT_AMBULATORY_CARE_PROVIDER_SITE_OTHER): Payer: PPO | Admitting: Specialist

## 2018-06-21 ENCOUNTER — Ambulatory Visit (INDEPENDENT_AMBULATORY_CARE_PROVIDER_SITE_OTHER): Payer: PPO

## 2018-06-21 VITALS — BP 119/66 | HR 69 | Ht 64.0 in | Wt 213.0 lb

## 2018-06-21 DIAGNOSIS — Z981 Arthrodesis status: Secondary | ICD-10-CM

## 2018-06-21 DIAGNOSIS — M48062 Spinal stenosis, lumbar region with neurogenic claudication: Secondary | ICD-10-CM

## 2018-06-21 DIAGNOSIS — Z9889 Other specified postprocedural states: Secondary | ICD-10-CM

## 2018-06-21 NOTE — Patient Instructions (Signed)
Plan: Avoid frequent bending and stooping  No lifting greater than 10 lbs. May use ice or moist heat for pain. Weight loss is of benefit. Vit D and calcium supplements. Exercise is important to improve your indurance and does allow people to function better inspite of back pain. Walk daily, building up distance in walking. Use a walker when walking to assist balance. Wear the lumbar brace when out of bed. You can be released from the skilled nursing facility.

## 2018-06-21 NOTE — Progress Notes (Signed)
Post-Op Visit Note   Patient: [facility]           Date of Birth: [birth year]           MRN: [id number] Visit Date: 06/21/2018 PCP: Nicholos Johns, MD   Assessment & Plan: 2 weeks following L2-3, L3-4 and L4-5 and L5-S1 decompression and L4-5 Fusion with TLIF.   Chief Complaint:  Chief Complaint  Patient presents with  . Lower Back - Routine Post Op   Visit Diagnoses:  1. Spinal stenosis of lumbar region with neurogenic claudication   2. S/P lumbar fusion   3. Status post lumbar laminectomy   Lower extremity motor is normal. SLR is negative. Radiographs show pedicle screws and rods  Plan: Avoid frequent bending and stooping  No lifting greater than 10 lbs. May use ice or moist heat for pain. Weight loss is of benefit. Vit D and calcium supplements. Exercise is important to improve your indurance and does allow people to function better inspite of back pain. Walk daily, building up distance in walking. Use a walker when walking to assist balance.  Wear the lumbar brace when out of bed. You can be released from the skilled nursing facility.   Follow-Up Instructions: Return in about 4 weeks (around 07/19/2018).   Orders:  Orders Placed This Encounter  Procedures  . XR Lumbar Spine 2-3 Views   No orders of the defined types were placed in this encounter.   Imaging: No results found.  PMFS History: Patient Active Problem List   Diagnosis Date Noted  . Spinal stenosis, lumbar region with neurogenic claudication 06/06/2018    Priority: High    Class: Acute  . Spondylolisthesis, lumbar region 06/06/2018    Priority: High    Class: Chronic  . Renal insufficiency 06/07/2018  . Hypotension   . Status post lumbar spinal fusion 06/06/2018  . Murmur, heart 02/24/2018  . Bradycardia 01/19/2018  . Coronary artery disease involving native coronary artery of native heart with angina pectoris ([place]) 05/29/2017  . Tobacco abuse 05/13/2017  . Hypertension 05/13/2017  .  Hyperlipidemia 05/13/2017  . GERD (gastroesophageal reflux disease) 05/13/2017  . COPD (chronic obstructive pulmonary disease) ([place]) 05/13/2017  . Carotid artery stenosis 05/13/2017  . Chronic diastolic heart failure ([place]) 06/09/2015  . Hypertensive heart disease with heart failure ([place]) 06/09/2015  . PAF (paroxysmal atrial fibrillation) ([place]) 06/09/2015   Past Medical History:  Diagnosis Date  . Anxiety   . Carotid artery stenosis, asymptomatic    50% ASYMPTOMATIC  RIGHT CAROTID STENOSIS  . Chronic diastolic heart failure ([place]) 06/09/2015  . COPD (chronic obstructive pulmonary disease) ([place])   . Depression   . Diabetes mellitus without complication ([place])   . Dysrhythmia   . Gastroesophageal reflux   . Hyperlipidemia   . Hypertension   . Hypertensive heart disease with heart failure ([place]) 06/09/2015  . PAF (paroxysmal atrial fibrillation) ([place]) 06/09/2015   Overview:  CHADS2 vasc score=6  . Renal insufficiency   . Tobacco abuse   . Tremors of nervous system     Family History  Problem Relation Age of Onset  . Heart attack Mother   . Hypertension Mother   . Hyperlipidemia Mother   . Heart attack Father   . Hypertension Father   . Hyperlipidemia Father   . Heart attack Sister   . Stroke Brother   . Heart attack Brother   . Hypertension Daughter     Past Surgical History:  Procedure Laterality Date  .  ABDOMINAL HYSTERECTOMY    . CORONARY ARTERY BYPASS GRAFT  2012  . NOSE SURGERY     Social History   Occupational History  . Not on file  Tobacco Use  . Smoking status: Former Research scientist (life sciences)  . Smokeless tobacco: Never Used  Substance and Sexual Activity  . Alcohol use: No  . Drug use: No  . Sexual activity: Not on file

## 2018-06-21 NOTE — Telephone Encounter (Signed)
Patient has an appointment for October 7th at 3:15.  Dr. Louanne Skye wanted to see her back in 4 weeks.  Would you place her on your cancellation list.  Thank you.

## 2018-06-22 NOTE — Telephone Encounter (Signed)
I out her on the cancellation list

## 2018-06-27 ENCOUNTER — Other Ambulatory Visit: Payer: Self-pay

## 2018-06-27 NOTE — Patient Outreach (Signed)
[facility] Care Management  06/27/2018  Cheryl Summers 1941-06-24 [id number]     EMMI-General Discharge RED ON EMMI ALERT Day # 1 Date: 06/23/18 Red Alert Reason: " got discharge papers? I don't know"   Outreach attempt # 1 to patient. Spoke with patient. She voices that things are going well since she returned [facility]. She voices that she has very supportive family assisting her. She states that her sister is currently staying with her and helping her out. Reviewed and addressed red alert with patient. She states that he daughter handles her affairs and has all her paperwork along with manages her appts and medications. Patient was able to confirm that she has PCP appt on tomorrow but could not remember when her daughter scheduled her other appts. Her family will be taking her to appts. She denies any issues with meds. Patient pleased to report that hs is ambulating inside and outside the home on her own with the assistance of a walker and pleased at the progress she is making. She denies any further RN CM needs or concerns at this time. Advised patient that they would get one more automated EMMI-GENERAL post discharge calls to assess how they are doing following recent hospitalization and will receive a call from a nurse if any of their responses were abnormal. Patient voiced understanding and was appreciative of f/u call.       Plan: RN CM will close case at this time as no further interventions needed.    Cheryl Montgomery, RN,BSN,CCM [facility] Telephonic Care Management Coordinator Direct Phone: [phone] Toll Free: [phone] Fax: [phone]

## 2018-06-28 DIAGNOSIS — Z09 Encounter for follow-up examination after completed treatment for conditions other than malignant neoplasm: Secondary | ICD-10-CM | POA: Diagnosis not present

## 2018-06-28 DIAGNOSIS — J449 Chronic obstructive pulmonary disease, unspecified: Secondary | ICD-10-CM | POA: Diagnosis not present

## 2018-06-28 DIAGNOSIS — D649 Anemia, unspecified: Secondary | ICD-10-CM | POA: Diagnosis not present

## 2018-06-28 DIAGNOSIS — E669 Obesity, unspecified: Secondary | ICD-10-CM | POA: Diagnosis not present

## 2018-06-28 DIAGNOSIS — M545 Low back pain: Secondary | ICD-10-CM | POA: Diagnosis not present

## 2018-06-28 DIAGNOSIS — Z6837 Body mass index (BMI) 37.0-37.9, adult: Secondary | ICD-10-CM | POA: Diagnosis not present

## 2018-06-28 DIAGNOSIS — I10 Essential (primary) hypertension: Secondary | ICD-10-CM | POA: Diagnosis not present

## 2018-06-28 DIAGNOSIS — E1140 Type 2 diabetes mellitus with diabetic neuropathy, unspecified: Secondary | ICD-10-CM | POA: Diagnosis not present

## 2018-06-28 DIAGNOSIS — E538 Deficiency of other specified B group vitamins: Secondary | ICD-10-CM | POA: Diagnosis not present

## 2018-06-28 DIAGNOSIS — Z79899 Other long term (current) drug therapy: Secondary | ICD-10-CM | POA: Diagnosis not present

## 2018-06-29 DIAGNOSIS — I5030 Unspecified diastolic (congestive) heart failure: Secondary | ICD-10-CM | POA: Diagnosis not present

## 2018-06-29 DIAGNOSIS — I480 Paroxysmal atrial fibrillation: Secondary | ICD-10-CM | POA: Diagnosis not present

## 2018-06-29 DIAGNOSIS — Z7984 Long term (current) use of oral hypoglycemic drugs: Secondary | ICD-10-CM | POA: Diagnosis not present

## 2018-06-29 DIAGNOSIS — E1122 Type 2 diabetes mellitus with diabetic chronic kidney disease: Secondary | ICD-10-CM | POA: Diagnosis not present

## 2018-06-29 DIAGNOSIS — I2510 Atherosclerotic heart disease of native coronary artery without angina pectoris: Secondary | ICD-10-CM | POA: Diagnosis not present

## 2018-06-29 DIAGNOSIS — I130 Hypertensive heart and chronic kidney disease with heart failure and stage 1 through stage 4 chronic kidney disease, or unspecified chronic kidney disease: Secondary | ICD-10-CM | POA: Diagnosis not present

## 2018-06-29 DIAGNOSIS — Z6837 Body mass index (BMI) 37.0-37.9, adult: Secondary | ICD-10-CM | POA: Diagnosis not present

## 2018-06-29 DIAGNOSIS — Z4789 Encounter for other orthopedic aftercare: Secondary | ICD-10-CM | POA: Diagnosis not present

## 2018-06-29 DIAGNOSIS — E1140 Type 2 diabetes mellitus with diabetic neuropathy, unspecified: Secondary | ICD-10-CM | POA: Diagnosis not present

## 2018-06-29 DIAGNOSIS — N189 Chronic kidney disease, unspecified: Secondary | ICD-10-CM | POA: Diagnosis not present

## 2018-06-29 DIAGNOSIS — F329 Major depressive disorder, single episode, unspecified: Secondary | ICD-10-CM | POA: Diagnosis not present

## 2018-06-29 DIAGNOSIS — Z951 Presence of aortocoronary bypass graft: Secondary | ICD-10-CM | POA: Diagnosis not present

## 2018-06-29 DIAGNOSIS — E669 Obesity, unspecified: Secondary | ICD-10-CM | POA: Diagnosis not present

## 2018-06-29 DIAGNOSIS — Z981 Arthrodesis status: Secondary | ICD-10-CM | POA: Diagnosis not present

## 2018-06-29 DIAGNOSIS — M810 Age-related osteoporosis without current pathological fracture: Secondary | ICD-10-CM | POA: Diagnosis not present

## 2018-06-29 DIAGNOSIS — Z9181 History of falling: Secondary | ICD-10-CM | POA: Diagnosis not present

## 2018-06-29 DIAGNOSIS — Z79891 Long term (current) use of opiate analgesic: Secondary | ICD-10-CM | POA: Diagnosis not present

## 2018-06-29 DIAGNOSIS — D631 Anemia in chronic kidney disease: Secondary | ICD-10-CM | POA: Diagnosis not present

## 2018-06-29 DIAGNOSIS — Z87891 Personal history of nicotine dependence: Secondary | ICD-10-CM | POA: Diagnosis not present

## 2018-06-29 DIAGNOSIS — J449 Chronic obstructive pulmonary disease, unspecified: Secondary | ICD-10-CM | POA: Diagnosis not present

## 2018-06-29 DIAGNOSIS — M48062 Spinal stenosis, lumbar region with neurogenic claudication: Secondary | ICD-10-CM | POA: Diagnosis not present

## 2018-06-29 DIAGNOSIS — Z90710 Acquired absence of both cervix and uterus: Secondary | ICD-10-CM | POA: Diagnosis not present

## 2018-06-29 DIAGNOSIS — Z7951 Long term (current) use of inhaled steroids: Secondary | ICD-10-CM | POA: Diagnosis not present

## 2018-07-04 ENCOUNTER — Other Ambulatory Visit (INDEPENDENT_AMBULATORY_CARE_PROVIDER_SITE_OTHER): Payer: Self-pay | Admitting: Specialist

## 2018-07-04 NOTE — Telephone Encounter (Signed)
Patient's daughter [name]) called needing Rx refilled (Hydrocodone) for her mother. The number to contact [name] is [phone]

## 2018-07-04 NOTE — Telephone Encounter (Signed)
Sent request to Dr. Nitka 

## 2018-07-05 MED ORDER — HYDROCODONE-ACETAMINOPHEN 5-325 MG PO TABS: 1.0000 | ORAL_TABLET | Freq: Four times a day (QID) | ORAL | 0 refills | Status: DC | PRN

## 2018-07-06 NOTE — Telephone Encounter (Signed)
[facility] is aware Savannaha's rx is ready for pick up at the front desk

## 2018-07-14 ENCOUNTER — Telehealth (INDEPENDENT_AMBULATORY_CARE_PROVIDER_SITE_OTHER): Payer: Self-pay | Admitting: Specialist

## 2018-07-14 NOTE — Telephone Encounter (Signed)
I called to work them in but there was no answer so I called the next person, I called and lmom advising of this

## 2018-07-14 NOTE — Telephone Encounter (Signed)
Patient's daughter called stating that she received a call from our office, but they did not leave a message as to who called.  CB#[phone].  Thank you.

## 2018-07-20 ENCOUNTER — Telehealth (INDEPENDENT_AMBULATORY_CARE_PROVIDER_SITE_OTHER): Payer: Self-pay | Admitting: Specialist

## 2018-07-20 DIAGNOSIS — R600 Localized edema: Secondary | ICD-10-CM | POA: Diagnosis not present

## 2018-07-20 DIAGNOSIS — E1142 Type 2 diabetes mellitus with diabetic polyneuropathy: Secondary | ICD-10-CM | POA: Diagnosis not present

## 2018-07-20 DIAGNOSIS — B351 Tinea unguium: Secondary | ICD-10-CM | POA: Diagnosis not present

## 2018-07-20 NOTE — Telephone Encounter (Signed)
Patients daughter calling to see if there is another brace patient can wear, she said its rising up and bruising underneath her breasts. Please advise what they can do # [phone]

## 2018-07-20 NOTE — Telephone Encounter (Signed)
Patients daughter calling to see if there is another brace patient can wear, she said its rising up and bruising underneath her breasts. Please advise what they can do

## 2018-07-25 NOTE — Telephone Encounter (Signed)
She should call [employer] and have the brace fitted to her better. The brace is very expensive and the company that provided it should be able to assist with adjusting the brace. jen

## 2018-07-27 NOTE — Telephone Encounter (Signed)
I called and lmom for [name] advising on the message below and left their phone number for her to call and make an appt with them

## 2018-07-28 DIAGNOSIS — J449 Chronic obstructive pulmonary disease, unspecified: Secondary | ICD-10-CM | POA: Diagnosis not present

## 2018-07-31 ENCOUNTER — Ambulatory Visit (INDEPENDENT_AMBULATORY_CARE_PROVIDER_SITE_OTHER): Payer: PPO | Admitting: Specialist

## 2018-07-31 ENCOUNTER — Ambulatory Visit (INDEPENDENT_AMBULATORY_CARE_PROVIDER_SITE_OTHER): Payer: PPO

## 2018-07-31 ENCOUNTER — Encounter (INDEPENDENT_AMBULATORY_CARE_PROVIDER_SITE_OTHER): Payer: Self-pay | Admitting: Specialist

## 2018-07-31 VITALS — BP 132/68 | HR 72 | Ht 64.0 in | Wt 213.0 lb

## 2018-07-31 DIAGNOSIS — Z981 Arthrodesis status: Secondary | ICD-10-CM

## 2018-07-31 DIAGNOSIS — M48062 Spinal stenosis, lumbar region with neurogenic claudication: Secondary | ICD-10-CM

## 2018-07-31 MED ORDER — HYDROCODONE-ACETAMINOPHEN 5-325 MG PO TABS: 1.0000 | ORAL_TABLET | Freq: Four times a day (QID) | ORAL | 0 refills | Status: DC | PRN

## 2018-07-31 NOTE — Progress Notes (Signed)
Post-Op Visit Note   Patient: Cheryl Summers           Date of Birth: 08/24/1941           MRN: [id number] Visit Date: 07/31/2018 PCP: Nicholos Johns, MD   Assessment & Plan: 8 weeks post L4-5 fusion with TLIF and pedicle screws and rods.   Chief Complaint:  Chief Complaint  Patient presents with  . Lower Back - Routine Post Op   Visit Diagnoses:  1. Spinal stenosis of lumbar region with neurogenic claudication   2. S/P lumbar fusion   Incision is healed. The leg pain is better.  Legs are NV normal but she has poor endurance.  Plan:Avoid bending, stooping and avoid lifting weights greater than 10 lbs. Avoid prolong standing and walking. Avoid frequent bending and stooping  No lifting greater than 10 lbs. May use ice or moist heat for pain. Weight loss is of benefit. Handicap license is approved. Use the brace half days for 4 weeks then discontinue.   Follow-Up Instructions: No follow-ups on file.   Orders:  Orders Placed This Encounter  Procedures  . XR Lumbar Spine 2-3 Views   No orders of the defined types were placed in this encounter.   Imaging: No results found.  PMFS History: Patient Active Problem List   Diagnosis Date Noted  . Spinal stenosis, lumbar region with neurogenic claudication 06/06/2018    Priority: High    Class: Acute  . Spondylolisthesis, lumbar region 06/06/2018    Priority: High    Class: Chronic  . Renal insufficiency 06/07/2018  . Hypotension   . Status post lumbar spinal fusion 06/06/2018  . Murmur, heart 02/24/2018  . Bradycardia 01/19/2018  . Coronary artery disease involving native coronary artery of native heart with angina pectoris ([place]) 05/29/2017  . Tobacco abuse 05/13/2017  . Hypertension 05/13/2017  . Hyperlipidemia 05/13/2017  . GERD (gastroesophageal reflux disease) 05/13/2017  . COPD (chronic obstructive pulmonary disease) ([place]) 05/13/2017  . Carotid artery stenosis 05/13/2017  . Chronic diastolic heart failure  ([place]) 06/09/2015  . Hypertensive heart disease with heart failure ([place]) 06/09/2015  . PAF (paroxysmal atrial fibrillation) ([place]) 06/09/2015   Past Medical History:  Diagnosis Date  . Anxiety   . Carotid artery stenosis, asymptomatic    50% ASYMPTOMATIC  RIGHT CAROTID STENOSIS  . Chronic diastolic heart failure ([place]) 06/09/2015  . COPD (chronic obstructive pulmonary disease) ([place])   . Depression   . Diabetes mellitus without complication ([place])   . Dysrhythmia   . Gastroesophageal reflux   . Hyperlipidemia   . Hypertension   . Hypertensive heart disease with heart failure ([place]) 06/09/2015  . PAF (paroxysmal atrial fibrillation) ([place]) 06/09/2015   Overview:  CHADS2 vasc score=6  . Renal insufficiency   . Tobacco abuse   . Tremors of nervous system     Family History  Problem Relation Age of Onset  . Heart attack Mother   . Hypertension Mother   . Hyperlipidemia Mother   . Heart attack Father   . Hypertension Father   . Hyperlipidemia Father   . Heart attack Sister   . Stroke Brother   . Heart attack Brother   . Hypertension Daughter     Past Surgical History:  Procedure Laterality Date  . ABDOMINAL HYSTERECTOMY    . CORONARY ARTERY BYPASS GRAFT  2012  . NOSE SURGERY     Social History   Occupational History  . Not on file  Tobacco Use  .  Smoking status: Former Research scientist (life sciences)  . Smokeless tobacco: Never Used  Substance and Sexual Activity  . Alcohol use: No  . Drug use: No  . Sexual activity: Not on file

## 2018-07-31 NOTE — Patient Instructions (Addendum)
Avoid bending, stooping and avoid lifting weights greater than 10 lbs. Avoid prolong standing and walking. Avoid frequent bending and stooping  No lifting greater than 10 lbs. May use ice or moist heat for pain. Weight loss is of benefit. Handicap license is approved. Use the brace half days for 4 weeks then discontinue.

## 2018-08-15 DIAGNOSIS — Z1331 Encounter for screening for depression: Secondary | ICD-10-CM | POA: Diagnosis not present

## 2018-08-15 DIAGNOSIS — Z23 Encounter for immunization: Secondary | ICD-10-CM | POA: Diagnosis not present

## 2018-08-15 DIAGNOSIS — Z6835 Body mass index (BMI) 35.0-35.9, adult: Secondary | ICD-10-CM | POA: Diagnosis not present

## 2018-08-15 DIAGNOSIS — K219 Gastro-esophageal reflux disease without esophagitis: Secondary | ICD-10-CM | POA: Diagnosis not present

## 2018-08-15 DIAGNOSIS — R630 Anorexia: Secondary | ICD-10-CM | POA: Diagnosis not present

## 2018-08-18 IMAGING — RF DG C-ARM 61-120 MIN
1 series · 4 of 4 positions shown · non-contrast
Comparison: Radiograph 01/18/2018, MRI 12/28/2017

CLINICAL DATA: Central laminectomy

EXAM:
LUMBAR SPINE - COMPLETE 4+ VIEW; DG C-ARM 61-120 MIN

[Series 1: run · 4 of 4 slices shown]
[im 1/4]
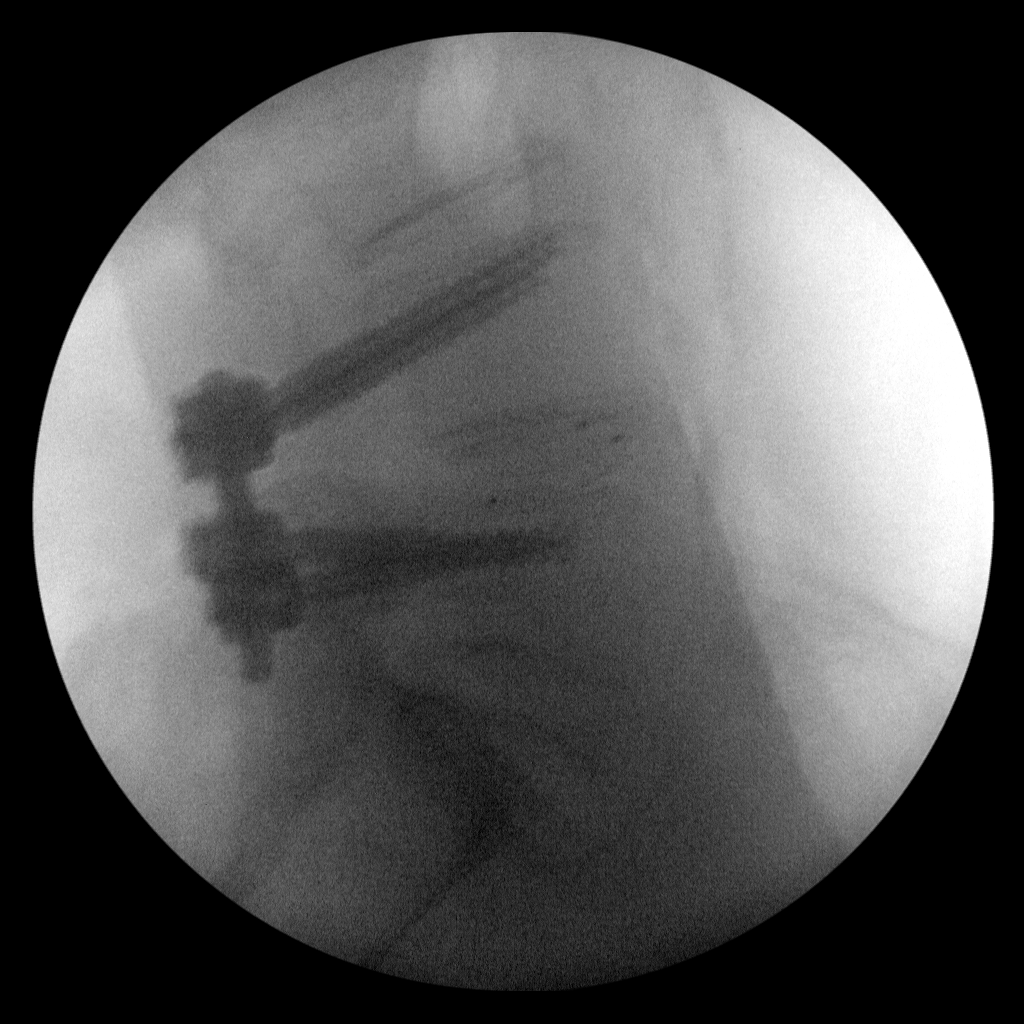
[im 2/4]
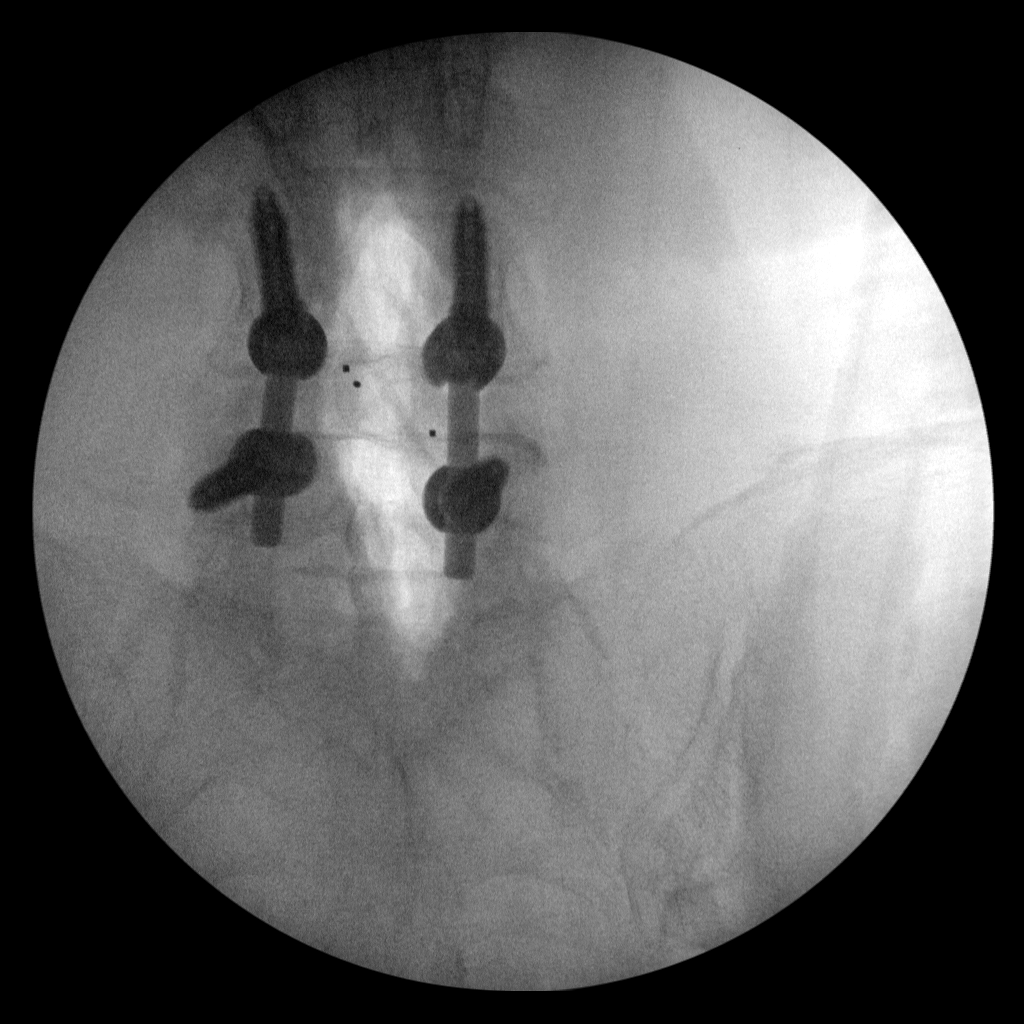
[im 3/4]
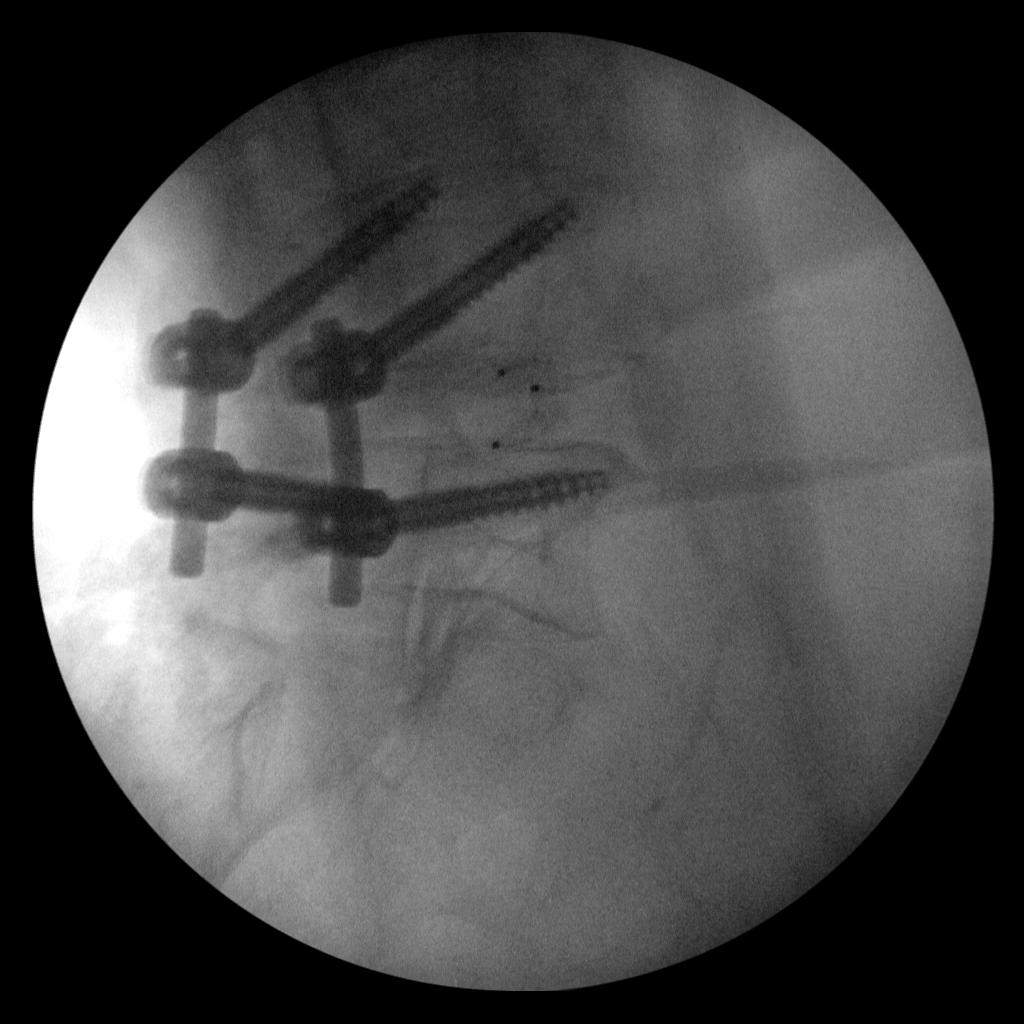
[im 4/4]
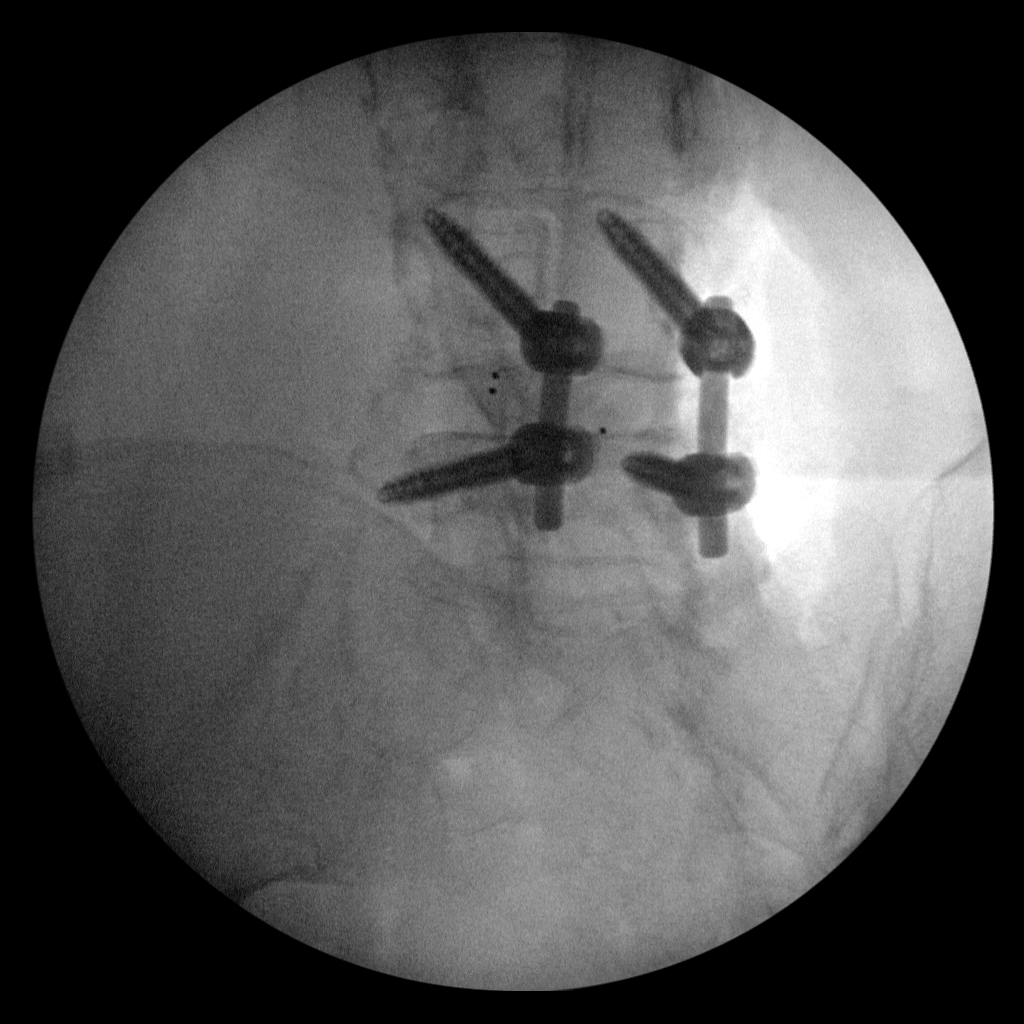

[4 of 4 positions shown; findings below may reference images not displayed]

FINDINGS: Four low resolution intraoperative spot views of the lumbar spine.
Total fluoroscopy time was 1 minutes 35 seconds. Posterior
stabilization rods and pedicle screws with interbody device at what
appears to be L4-L5.
IMPRESSION: Intraoperative fluoroscopic assistance provided during lumbar spine
surgery.

## 2018-08-19 IMAGING — DX DG CHEST 1V PORT
1 series · 1 of 1 positions shown · non-contrast
Comparison: 09/08/2017, CT chest 09/27/2017

CLINICAL DATA: Hypertension

EXAM:
PORTABLE CHEST 1 VIEW

[chest ap]
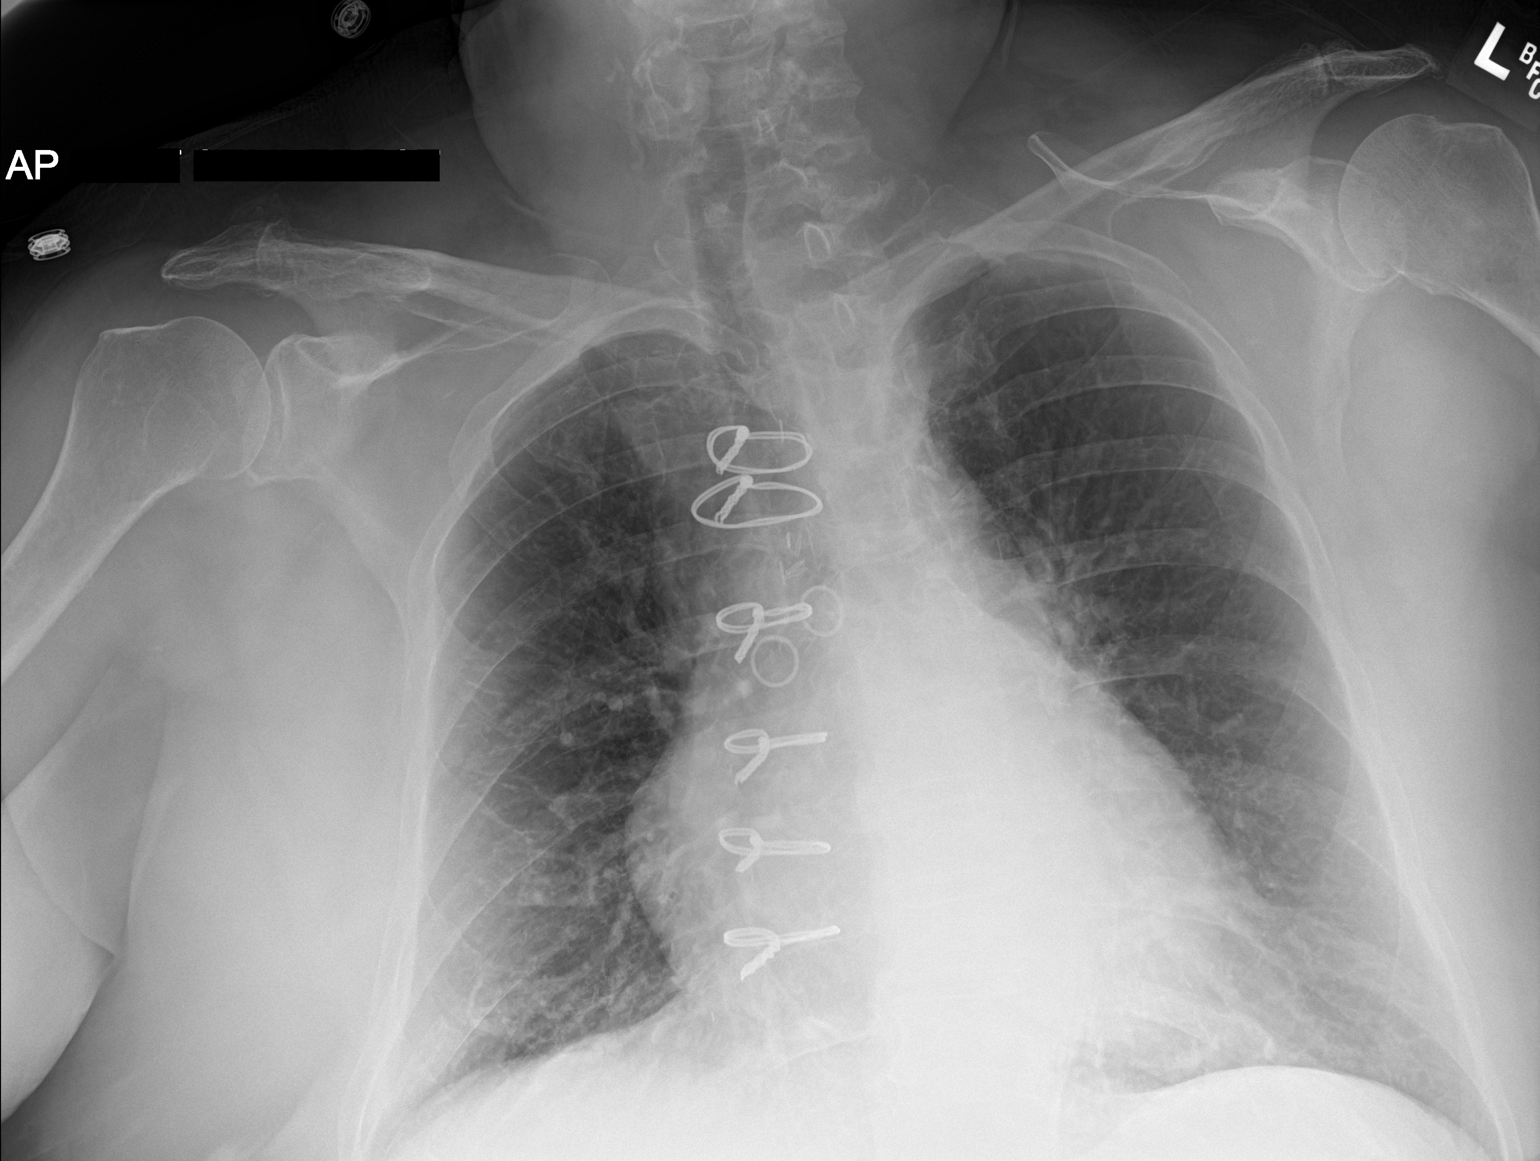

[1 of 1 positions shown; findings below may reference images not displayed]

FINDINGS: Post sternotomy changes. Mild bronchitic changes, chronic. No acute
consolidation or effusion. Mild cardiomegaly. No pneumothorax.
IMPRESSION: 1. Mild cardiomegaly without acute infiltrate or edema
2. Mild chronic bronchitic changes.

## 2018-08-28 DIAGNOSIS — J449 Chronic obstructive pulmonary disease, unspecified: Secondary | ICD-10-CM | POA: Diagnosis not present

## 2018-09-08 ENCOUNTER — Ambulatory Visit (INDEPENDENT_AMBULATORY_CARE_PROVIDER_SITE_OTHER): Payer: PPO | Admitting: Specialist

## 2018-09-08 ENCOUNTER — Encounter (INDEPENDENT_AMBULATORY_CARE_PROVIDER_SITE_OTHER): Payer: Self-pay | Admitting: Specialist

## 2018-09-08 ENCOUNTER — Ambulatory Visit (INDEPENDENT_AMBULATORY_CARE_PROVIDER_SITE_OTHER): Payer: PPO

## 2018-09-08 VITALS — BP 116/58 | HR 76 | Ht 64.0 in | Wt 213.0 lb

## 2018-09-08 DIAGNOSIS — Z981 Arthrodesis status: Secondary | ICD-10-CM | POA: Diagnosis not present

## 2018-09-08 MED ORDER — HYDROCODONE-ACETAMINOPHEN 5-325 MG PO TABS: 1.0000 | ORAL_TABLET | Freq: Four times a day (QID) | ORAL | 0 refills | Status: AC | PRN

## 2018-09-08 NOTE — Patient Instructions (Addendum)
Avoid frequent bending and stooping  No lifting greater than 10 lbs. May use ice or moist heat for pain. Weight loss is of benefit. Best medication for lumbar disc disease is arthritis medications like tylenol or tramadol. Due to borderline kidney function avoid medications like motrin, advil and alleve.  Exercise is important to improve your indurance and does allow people to function better inspite of back pain. Wean from the use of narcotic, the hydrocodone to using a tylenol PM.

## 2018-09-08 NOTE — Progress Notes (Signed)
Office Visit Note   Patient: Cheryl Summers           Date of Birth: [birth year]           MRN: [id number] Visit Date: 09/08/2018              Requested by: Nicholos Johns, MD [address] [place], [place] [address] PCP: Nicholos Johns, MD   Assessment & Plan: Visit Diagnoses:  1. S/P lumbar fusion     Plan:Avoid frequent bending and stooping  No lifting greater than 10 lbs. May use ice or moist heat for pain. Weight loss is of benefit. Best medication for lumbar disc disease is arthritis medications like tylenol or tramadol. Due to borderline kidney function avoid medications like motrin, advil and alleve.  Exercise is important to improve your indurance and does allow people to function better inspite of back pain. Wean from the use of narcotic, the hydrocodone to using a tylenol PM.   Follow-Up Instructions: Return in about 3 months (around 12/09/2018).   Orders:  Orders Placed This Encounter  Procedures  . XR Lumbar Spine 2-3 Views   Meds ordered this encounter  Medications  . HYDROcodone-acetaminophen (NORCO/VICODIN) 5-325 MG tablet    Sig: Take 1 tablet by mouth every 6 (six) hours as needed for up to 7 days for moderate pain.    Dispense:  30 tablet    Refill:  0      Procedures: No procedures performed   Clinical Data: No additional findings.   Subjective: No chief complaint on file.   77 year old female 3 months and 10 days post lumbar decompression and fusion L4-5 with pedicle screws and rods and cage. She stopped using her brace 2 weeks ago. Has been able to feed the cat, first time in one year. She is standing better and has been able to go outside to do some looking, not gardening. No bowel or bladder difficulty. She is taking one pain medication at  Night. We want to decrease this and stop it eventually. She has some    Review of Systems  Constitutional: Negative.   HENT: Negative.   Eyes: Negative.   Respiratory: Negative.     Cardiovascular: Negative.   Gastrointestinal: Negative.   Endocrine: Negative.   Genitourinary: Negative.   Musculoskeletal: Negative.   Skin: Negative.   Allergic/Immunologic: Negative.   Neurological: Negative.   Hematological: Negative.   Psychiatric/Behavioral: Negative.      Objective: Vital Signs: BP (!) 116/58 (BP Location: Left Arm, Patient Position: Sitting)   Pulse 76   Ht 5\' 4"  (1.626 m)   Wt 213 lb (96.6 kg)   BMI 36.56 kg/m   Physical Exam  Constitutional: She is oriented to person, place, and time. She appears well-developed and well-nourished.  HENT:  Head: Normocephalic and atraumatic.  Eyes: Pupils are equal, round, and reactive to light. EOM are normal.  Neck: Normal range of motion. Neck supple.  Pulmonary/Chest: Effort normal and breath sounds normal.  Abdominal: Soft. Bowel sounds are normal.  Neurological: She is alert and oriented to person, place, and time.  Skin: Skin is warm and dry.  Psychiatric: She has a normal mood and affect. Her behavior is normal. Judgment and thought content normal.    Back Exam   Tenderness  The patient is experiencing tenderness in the lumbar.  Range of Motion  Extension: normal  Flexion:  60 abnormal  Lateral bend right: normal  Lateral bend left: normal  Rotation right: normal  Rotation left: normal   Muscle Strength  Right Quadriceps:  5/5  Left Quadriceps:  5/5  Right Hamstrings:  5/5  Left Hamstrings:  5/5   Tests  Straight leg raise right: negative Straight leg raise left: negative  Reflexes  Patellar: 0/4 Achilles: 0/4 Babinski's sign: normal   Other  Toe walk: normal Heel walk: normal Sensation: normal Gait: normal  Erythema: no back redness Scars: present  Comments:  Motor is normal. SLR is normal.       Specialty Comments:  No specialty comments available.  Imaging: Xr Lumbar Spine 2-3 Views  Result Date: 09/08/2018 Plain radiographs AP and lateral flexion and extension  radiographs show pedicle screws and rods in good position and alignment. Cage is well seated, with flexion and extension show less than one mm of motion Indicating that the fusion is healing.     PMFS History: Patient Active Problem List   Diagnosis Date Noted  . Spinal stenosis, lumbar region with neurogenic claudication 06/06/2018    Priority: High    Class: Acute  . Spondylolisthesis, lumbar region 06/06/2018    Priority: High    Class: Chronic  . Renal insufficiency 06/07/2018  . Hypotension   . Status post lumbar spinal fusion 06/06/2018  . Murmur, heart 02/24/2018  . Bradycardia 01/19/2018  . Coronary artery disease involving native coronary artery of native heart with angina pectoris ([place]) 05/29/2017  . Tobacco abuse 05/13/2017  . Hypertension 05/13/2017  . Hyperlipidemia 05/13/2017  . GERD (gastroesophageal reflux disease) 05/13/2017  . COPD (chronic obstructive pulmonary disease) ([place]) 05/13/2017  . Carotid artery stenosis 05/13/2017  . Chronic diastolic heart failure ([place]) 06/09/2015  . Hypertensive heart disease with heart failure ([place]) 06/09/2015  . PAF (paroxysmal atrial fibrillation) ([place]) 06/09/2015   Past Medical History:  Diagnosis Date  . Anxiety   . Carotid artery stenosis, asymptomatic    50% ASYMPTOMATIC  RIGHT CAROTID STENOSIS  . Chronic diastolic heart failure ([place]) 06/09/2015  . COPD (chronic obstructive pulmonary disease) ([place])   . Depression   . Diabetes mellitus without complication ([place])   . Dysrhythmia   . Gastroesophageal reflux   . Hyperlipidemia   . Hypertension   . Hypertensive heart disease with heart failure ([place]) 06/09/2015  . PAF (paroxysmal atrial fibrillation) ([place]) 06/09/2015   Overview:  CHADS2 vasc score=6  . Renal insufficiency   . Tobacco abuse   . Tremors of nervous system     Family History  Problem Relation Age of Onset  . Heart attack Mother   . Hypertension Mother   . Hyperlipidemia Mother   . Heart attack Father   .  Hypertension Father   . Hyperlipidemia Father   . Heart attack Sister   . Stroke Brother   . Heart attack Brother   . Hypertension Daughter     Past Surgical History:  Procedure Laterality Date  . ABDOMINAL HYSTERECTOMY    . CORONARY ARTERY BYPASS GRAFT  2012  . NOSE SURGERY     Social History   Occupational History  . Not on file  Tobacco Use  . Smoking status: Former Research scientist (life sciences)  . Smokeless tobacco: Never Used  Substance and Sexual Activity  . Alcohol use: No  . Drug use: No  . Sexual activity: Not on file

## 2018-10-09 DIAGNOSIS — F329 Major depressive disorder, single episode, unspecified: Secondary | ICD-10-CM | POA: Diagnosis not present

## 2018-10-09 DIAGNOSIS — Z760 Encounter for issue of repeat prescription: Secondary | ICD-10-CM | POA: Diagnosis not present

## 2018-10-09 DIAGNOSIS — E559 Vitamin D deficiency, unspecified: Secondary | ICD-10-CM | POA: Diagnosis not present

## 2018-10-09 DIAGNOSIS — E785 Hyperlipidemia, unspecified: Secondary | ICD-10-CM | POA: Diagnosis not present

## 2018-10-09 DIAGNOSIS — Z79899 Other long term (current) drug therapy: Secondary | ICD-10-CM | POA: Diagnosis not present

## 2018-10-09 DIAGNOSIS — D649 Anemia, unspecified: Secondary | ICD-10-CM | POA: Diagnosis not present

## 2018-10-09 DIAGNOSIS — Z6837 Body mass index (BMI) 37.0-37.9, adult: Secondary | ICD-10-CM | POA: Diagnosis not present

## 2018-10-09 DIAGNOSIS — M1990 Unspecified osteoarthritis, unspecified site: Secondary | ICD-10-CM | POA: Diagnosis not present

## 2018-11-09 DIAGNOSIS — B351 Tinea unguium: Secondary | ICD-10-CM | POA: Diagnosis not present

## 2018-11-09 DIAGNOSIS — Z7984 Long term (current) use of oral hypoglycemic drugs: Secondary | ICD-10-CM | POA: Diagnosis not present

## 2018-11-09 DIAGNOSIS — R600 Localized edema: Secondary | ICD-10-CM | POA: Diagnosis not present

## 2018-11-09 DIAGNOSIS — E1142 Type 2 diabetes mellitus with diabetic polyneuropathy: Secondary | ICD-10-CM | POA: Diagnosis not present

## 2018-11-22 DIAGNOSIS — E559 Vitamin D deficiency, unspecified: Secondary | ICD-10-CM | POA: Diagnosis not present

## 2018-11-22 DIAGNOSIS — E1140 Type 2 diabetes mellitus with diabetic neuropathy, unspecified: Secondary | ICD-10-CM | POA: Diagnosis not present

## 2018-11-22 DIAGNOSIS — R0989 Other specified symptoms and signs involving the circulatory and respiratory systems: Secondary | ICD-10-CM | POA: Diagnosis not present

## 2018-11-22 DIAGNOSIS — I2510 Atherosclerotic heart disease of native coronary artery without angina pectoris: Secondary | ICD-10-CM | POA: Diagnosis not present

## 2018-11-22 DIAGNOSIS — Z1331 Encounter for screening for depression: Secondary | ICD-10-CM | POA: Diagnosis not present

## 2018-11-22 DIAGNOSIS — Z6835 Body mass index (BMI) 35.0-35.9, adult: Secondary | ICD-10-CM | POA: Diagnosis not present

## 2018-11-22 DIAGNOSIS — M1990 Unspecified osteoarthritis, unspecified site: Secondary | ICD-10-CM | POA: Diagnosis not present

## 2018-11-22 DIAGNOSIS — E785 Hyperlipidemia, unspecified: Secondary | ICD-10-CM | POA: Diagnosis not present

## 2018-11-22 DIAGNOSIS — Z79899 Other long term (current) drug therapy: Secondary | ICD-10-CM | POA: Diagnosis not present

## 2018-12-13 ENCOUNTER — Ambulatory Visit (INDEPENDENT_AMBULATORY_CARE_PROVIDER_SITE_OTHER): Payer: PPO | Admitting: Specialist

## 2018-12-25 ENCOUNTER — Encounter (INDEPENDENT_AMBULATORY_CARE_PROVIDER_SITE_OTHER): Payer: Self-pay | Admitting: Specialist

## 2018-12-25 ENCOUNTER — Ambulatory Visit (INDEPENDENT_AMBULATORY_CARE_PROVIDER_SITE_OTHER): Payer: PPO | Admitting: Specialist

## 2018-12-25 ENCOUNTER — Ambulatory Visit (INDEPENDENT_AMBULATORY_CARE_PROVIDER_SITE_OTHER): Payer: PPO

## 2018-12-25 VITALS — BP 124/67 | HR 80 | Ht 64.0 in | Wt 213.0 lb

## 2018-12-25 DIAGNOSIS — N289 Disorder of kidney and ureter, unspecified: Secondary | ICD-10-CM | POA: Diagnosis not present

## 2018-12-25 DIAGNOSIS — M25572 Pain in left ankle and joints of left foot: Secondary | ICD-10-CM

## 2018-12-25 DIAGNOSIS — Z981 Arthrodesis status: Secondary | ICD-10-CM

## 2018-12-25 DIAGNOSIS — S93412A Sprain of calcaneofibular ligament of left ankle, initial encounter: Secondary | ICD-10-CM

## 2018-12-25 NOTE — Progress Notes (Signed)
Office Visit Note   Patient: Cheryl Summers           Date of Birth: [birth year]           MRN: [id number] Visit Date: 12/25/2018              Requested by: Nicholos Johns, MD [place], [place] [address] PCP: Nicholos Johns, MD   Assessment & Plan: Visit Diagnoses:  1. S/P lumbar fusion   2. Pain in left ankle and joints of left foot     Plan: Avoid frequent bending and stooping  No lifting greater than 10 lbs. May use ice or moist heat for pain. Weight loss is of benefit. Tylenol for discomfort Wear the left ankle wrap for 2-3 weeks, removing the ACE wrap and the ASO twice daily to be sure skin is in good condition.  May remove to bath. A uric acid level is drawn to rule out gout. An ankle sprain manual is provided    Follow-Up Instructions: No follow-ups on file.   Orders:  Orders Placed This Encounter  Procedures  . XR Lumbar Spine 2-3 Views  . XR Ankle Complete Left   No orders of the defined types were placed in this encounter.     Procedures: No procedures performed   Clinical Data: No additional findings.   Subjective: Chief Complaint  Patient presents with  . Lower Back - Follow-up  . Left Ankle - Pain    78 year old female with 2day history of left ankle pain spontaneous onset. She has 2 small dogs and they quite often will get caught up in her legs. She is now 7 months since undergoing L4-5 fusion for severe spinal stenosis with spondylolisthesis. She reports pain with weight bearing on the left ankle present over the last 2 days. It hurts to put weight on the left leg walking down the  [place] today. This just started 2 days ago. No numbness or paresthesias. No bowel or bladder difficulty. No history of previous left ankle injury. She does have history of renal insufficiency and two brothers with history of gout. No complaints referred to her spine. She is glad she had the lumbar surgery done.    Review of Systems    Constitutional: Negative.   HENT: Negative.   Eyes: Negative.   Respiratory: Negative.   Cardiovascular: Negative.   Gastrointestinal: Negative.   Endocrine: Negative.   Genitourinary: Negative.   Musculoskeletal: Negative.   Skin: Negative.   Allergic/Immunologic: Negative.   Neurological: Negative.   Hematological: Negative.   Psychiatric/Behavioral: Negative.      Objective: Vital Signs: BP 124/67 (BP Location: Left Arm, Patient Position: Sitting)   Pulse 80   Ht 5\' 4"  (1.626 m)   Wt 213 lb (96.6 kg)   BMI 36.56 kg/m   Physical Exam Constitutional:      Appearance: She is well-developed.  HENT:     Head: Normocephalic and atraumatic.  Eyes:     Pupils: Pupils are equal, round, and reactive to light.  Neck:     Musculoskeletal: Normal range of motion and neck supple.  Pulmonary:     Effort: Pulmonary effort is normal.     Breath sounds: Normal breath sounds.  Abdominal:     General: Bowel sounds are normal.     Palpations: Abdomen is soft.  Skin:    General: Skin is warm and dry.  Neurological:     Mental Status: She is alert  and oriented to person, place, and time.  Psychiatric:        Behavior: Behavior normal.        Thought Content: Thought content normal.        Judgment: Judgment normal.     Right Ankle Exam   Comments:  Minimal pitting edema right leg and ankle. Left leg is more swollen.    Left Ankle Exam   Tenderness  The patient is experiencing tenderness in the ATF and lateral malleolus.  Swelling: moderate  Range of Motion  Dorsiflexion: normal  Plantar flexion: normal  Eversion: normal  Inversion: normal   Muscle Strength  Dorsiflexion:  5/5  Plantar flexion:  5/5  Anterior tibial:  5/5  Posterior tibial:  5/5 Gastrocsoleus:  5/5 Peroneal muscle:  5/5  Tests  Anterior drawer: negative Varus tilt: negative  Other  Erythema: absent Scars: absent Pulse: present  Comments:  Mild pitting edema left ankle and  leg.   Back Exam   Tenderness  The patient is experiencing tenderness in the lumbar.  Range of Motion  Extension: abnormal  Flexion: abnormal  Lateral bend right: normal  Lateral bend left: normal   Muscle Strength  Right Quadriceps:  5/5  Left Quadriceps:  5/5  Right Hamstrings:  5/5  Left Hamstrings:  5/5   Tests  Straight leg raise right: negative Straight leg raise left: negative  Reflexes  Patellar: normal Achilles: normal Babinski's sign: normal   Other  Toe walk: normal Heel walk: normal Sensation: normal Gait: normal  Erythema: no back redness Scars: absent      Specialty Comments:  No specialty comments available.  Imaging: No results found.   PMFS History: Patient Active Problem List   Diagnosis Date Noted  . Spinal stenosis, lumbar region with neurogenic claudication 06/06/2018    Priority: High    Class: Acute  . Spondylolisthesis, lumbar region 06/06/2018    Priority: High    Class: Chronic  . Renal insufficiency 06/07/2018  . Hypotension   . Status post lumbar spinal fusion 06/06/2018  . Murmur, heart 02/24/2018  . Bradycardia 01/19/2018  . Coronary artery disease involving native coronary artery of native heart with angina pectoris ([place]) 05/29/2017  . Tobacco abuse 05/13/2017  . Hypertension 05/13/2017  . Hyperlipidemia 05/13/2017  . GERD (gastroesophageal reflux disease) 05/13/2017  . COPD (chronic obstructive pulmonary disease) ([place]) 05/13/2017  . Carotid artery stenosis 05/13/2017  . Chronic diastolic heart failure ([place]) 06/09/2015  . Hypertensive heart disease with heart failure ([place]) 06/09/2015  . PAF (paroxysmal atrial fibrillation) ([place]) 06/09/2015   Past Medical History:  Diagnosis Date  . Anxiety   . Carotid artery stenosis, asymptomatic    50% ASYMPTOMATIC  RIGHT CAROTID STENOSIS  . Chronic diastolic heart failure ([place]) 06/09/2015  . COPD (chronic obstructive pulmonary disease) ([place])   . Depression   . Diabetes  mellitus without complication ([place])   . Dysrhythmia   . Gastroesophageal reflux   . Hyperlipidemia   . Hypertension   . Hypertensive heart disease with heart failure ([place]) 06/09/2015  . PAF (paroxysmal atrial fibrillation) ([place]) 06/09/2015   Overview:  CHADS2 vasc score=6  . Renal insufficiency   . Tobacco abuse   . Tremors of nervous system     Family History  Problem Relation Age of Onset  . Heart attack Mother   . Hypertension Mother   . Hyperlipidemia Mother   . Heart attack Father   . Hypertension Father   . Hyperlipidemia Father   .  Heart attack Sister   . Stroke Brother   . Heart attack Brother   . Hypertension Daughter     Past Surgical History:  Procedure Laterality Date  . ABDOMINAL HYSTERECTOMY    . CORONARY ARTERY BYPASS GRAFT  2012  . NOSE SURGERY     Social History   Occupational History  . Not on file  Tobacco Use  . Smoking status: Former Research scientist (life sciences)  . Smokeless tobacco: Never Used  Substance and Sexual Activity  . Alcohol use: No  . Drug use: No  . Sexual activity: Not on file

## 2018-12-25 NOTE — Patient Instructions (Signed)
Plan: Avoid frequent bending and stooping  No lifting greater than 10 lbs. May use ice or moist heat for pain. Weight loss is of benefit. Tylenol for discomfort Wear the left ankle wrap for 2-3 weeks, removing the ACE wrap and the ASO twice daily to be sure skin is in good condition.  May remove to bath. A uric acid level is drawn to rule out gout. An ankle sprain manual is provided

## 2018-12-26 LAB — URIC ACID: Uric Acid, Serum: 9.3 mg/dL — ABNORMAL HIGH (ref 2.5–7.0)

## 2018-12-27 ENCOUNTER — Other Ambulatory Visit (INDEPENDENT_AMBULATORY_CARE_PROVIDER_SITE_OTHER): Payer: Self-pay | Admitting: Specialist

## 2018-12-27 ENCOUNTER — Telehealth (INDEPENDENT_AMBULATORY_CARE_PROVIDER_SITE_OTHER): Payer: Self-pay | Admitting: Specialist

## 2018-12-27 MED ORDER — ALLOPURINOL 100 MG PO TABS: 100.0000 mg | ORAL_TABLET | Freq: Two times a day (BID) | ORAL | 3 refills | Status: DC

## 2018-12-27 MED ORDER — METHYLPREDNISOLONE 4 MG PO TABS: ORAL_TABLET | ORAL | 0 refills | Status: AC

## 2018-12-27 NOTE — Telephone Encounter (Signed)
Patient called about results of lab work [phone]

## 2018-12-28 NOTE — Telephone Encounter (Signed)
I called and advised [name] that Zakeria's blood work was elevated and that Dr. Louanne Skye had sent her some meds in yesterday for her to take.

## 2019-01-02 DIAGNOSIS — M109 Gout, unspecified: Secondary | ICD-10-CM | POA: Diagnosis not present

## 2019-01-02 DIAGNOSIS — E1140 Type 2 diabetes mellitus with diabetic neuropathy, unspecified: Secondary | ICD-10-CM | POA: Diagnosis not present

## 2019-01-02 DIAGNOSIS — Z79899 Other long term (current) drug therapy: Secondary | ICD-10-CM | POA: Diagnosis not present

## 2019-01-02 DIAGNOSIS — M1990 Unspecified osteoarthritis, unspecified site: Secondary | ICD-10-CM | POA: Diagnosis not present

## 2019-01-02 DIAGNOSIS — Z6836 Body mass index (BMI) 36.0-36.9, adult: Secondary | ICD-10-CM | POA: Diagnosis not present

## 2019-01-02 DIAGNOSIS — E785 Hyperlipidemia, unspecified: Secondary | ICD-10-CM | POA: Diagnosis not present

## 2019-01-02 DIAGNOSIS — I10 Essential (primary) hypertension: Secondary | ICD-10-CM | POA: Diagnosis not present

## 2019-01-02 DIAGNOSIS — F39 Unspecified mood [affective] disorder: Secondary | ICD-10-CM | POA: Diagnosis not present

## 2019-01-02 DIAGNOSIS — E1121 Type 2 diabetes mellitus with diabetic nephropathy: Secondary | ICD-10-CM | POA: Diagnosis not present

## 2019-01-10 NOTE — Progress Notes (Signed)
Patient has been advised of this 

## 2019-01-22 ENCOUNTER — Ambulatory Visit (INDEPENDENT_AMBULATORY_CARE_PROVIDER_SITE_OTHER): Payer: PPO | Admitting: Specialist

## 2019-02-05 DIAGNOSIS — E1140 Type 2 diabetes mellitus with diabetic neuropathy, unspecified: Secondary | ICD-10-CM | POA: Diagnosis not present

## 2019-02-05 DIAGNOSIS — M1990 Unspecified osteoarthritis, unspecified site: Secondary | ICD-10-CM | POA: Diagnosis not present

## 2019-02-05 DIAGNOSIS — N390 Urinary tract infection, site not specified: Secondary | ICD-10-CM | POA: Diagnosis not present

## 2019-02-05 DIAGNOSIS — E1121 Type 2 diabetes mellitus with diabetic nephropathy: Secondary | ICD-10-CM | POA: Diagnosis not present

## 2019-02-05 DIAGNOSIS — E559 Vitamin D deficiency, unspecified: Secondary | ICD-10-CM | POA: Diagnosis not present

## 2019-02-05 DIAGNOSIS — M109 Gout, unspecified: Secondary | ICD-10-CM | POA: Diagnosis not present

## 2019-02-05 DIAGNOSIS — I10 Essential (primary) hypertension: Secondary | ICD-10-CM | POA: Diagnosis not present

## 2019-02-05 DIAGNOSIS — R413 Other amnesia: Secondary | ICD-10-CM | POA: Diagnosis not present

## 2019-02-05 DIAGNOSIS — F418 Other specified anxiety disorders: Secondary | ICD-10-CM | POA: Diagnosis not present

## 2019-02-20 DIAGNOSIS — B351 Tinea unguium: Secondary | ICD-10-CM | POA: Diagnosis not present

## 2019-02-20 DIAGNOSIS — E1142 Type 2 diabetes mellitus with diabetic polyneuropathy: Secondary | ICD-10-CM | POA: Diagnosis not present

## 2019-03-05 DIAGNOSIS — E1140 Type 2 diabetes mellitus with diabetic neuropathy, unspecified: Secondary | ICD-10-CM | POA: Diagnosis not present

## 2019-03-05 DIAGNOSIS — M545 Low back pain: Secondary | ICD-10-CM | POA: Diagnosis not present

## 2019-03-05 DIAGNOSIS — E1121 Type 2 diabetes mellitus with diabetic nephropathy: Secondary | ICD-10-CM | POA: Diagnosis not present

## 2019-03-05 DIAGNOSIS — Z9181 History of falling: Secondary | ICD-10-CM | POA: Diagnosis not present

## 2019-03-05 DIAGNOSIS — E669 Obesity, unspecified: Secondary | ICD-10-CM | POA: Diagnosis not present

## 2019-03-05 DIAGNOSIS — R413 Other amnesia: Secondary | ICD-10-CM | POA: Diagnosis not present

## 2019-03-05 DIAGNOSIS — Z6835 Body mass index (BMI) 35.0-35.9, adult: Secondary | ICD-10-CM | POA: Diagnosis not present

## 2019-03-05 DIAGNOSIS — M109 Gout, unspecified: Secondary | ICD-10-CM | POA: Diagnosis not present

## 2019-03-28 DIAGNOSIS — E785 Hyperlipidemia, unspecified: Secondary | ICD-10-CM | POA: Diagnosis not present

## 2019-03-28 DIAGNOSIS — Z0000 Encounter for general adult medical examination without abnormal findings: Secondary | ICD-10-CM | POA: Diagnosis not present

## 2019-03-28 DIAGNOSIS — Z6835 Body mass index (BMI) 35.0-35.9, adult: Secondary | ICD-10-CM | POA: Diagnosis not present

## 2019-03-28 DIAGNOSIS — Z1331 Encounter for screening for depression: Secondary | ICD-10-CM | POA: Diagnosis not present

## 2019-03-28 DIAGNOSIS — Z9181 History of falling: Secondary | ICD-10-CM | POA: Diagnosis not present

## 2019-04-10 DIAGNOSIS — E559 Vitamin D deficiency, unspecified: Secondary | ICD-10-CM | POA: Diagnosis not present

## 2019-04-10 DIAGNOSIS — E785 Hyperlipidemia, unspecified: Secondary | ICD-10-CM | POA: Diagnosis not present

## 2019-04-10 DIAGNOSIS — I10 Essential (primary) hypertension: Secondary | ICD-10-CM | POA: Diagnosis not present

## 2019-04-10 DIAGNOSIS — K5909 Other constipation: Secondary | ICD-10-CM | POA: Diagnosis not present

## 2019-04-10 DIAGNOSIS — J309 Allergic rhinitis, unspecified: Secondary | ICD-10-CM | POA: Diagnosis not present

## 2019-04-10 DIAGNOSIS — L989 Disorder of the skin and subcutaneous tissue, unspecified: Secondary | ICD-10-CM | POA: Diagnosis not present

## 2019-04-10 DIAGNOSIS — E1140 Type 2 diabetes mellitus with diabetic neuropathy, unspecified: Secondary | ICD-10-CM | POA: Diagnosis not present

## 2019-04-10 DIAGNOSIS — M1990 Unspecified osteoarthritis, unspecified site: Secondary | ICD-10-CM | POA: Diagnosis not present

## 2019-05-09 DIAGNOSIS — M1990 Unspecified osteoarthritis, unspecified site: Secondary | ICD-10-CM | POA: Diagnosis not present

## 2019-05-09 DIAGNOSIS — M25562 Pain in left knee: Secondary | ICD-10-CM | POA: Diagnosis not present

## 2019-05-09 DIAGNOSIS — I10 Essential (primary) hypertension: Secondary | ICD-10-CM | POA: Diagnosis not present

## 2019-05-09 DIAGNOSIS — E1140 Type 2 diabetes mellitus with diabetic neuropathy, unspecified: Secondary | ICD-10-CM | POA: Diagnosis not present

## 2019-05-10 DIAGNOSIS — M25512 Pain in left shoulder: Secondary | ICD-10-CM | POA: Diagnosis not present

## 2019-05-10 DIAGNOSIS — S46012A Strain of muscle(s) and tendon(s) of the rotator cuff of left shoulder, initial encounter: Secondary | ICD-10-CM | POA: Diagnosis not present

## 2019-05-22 DIAGNOSIS — E1142 Type 2 diabetes mellitus with diabetic polyneuropathy: Secondary | ICD-10-CM | POA: Diagnosis not present

## 2019-05-22 DIAGNOSIS — Z7984 Long term (current) use of oral hypoglycemic drugs: Secondary | ICD-10-CM | POA: Diagnosis not present

## 2019-05-22 DIAGNOSIS — B351 Tinea unguium: Secondary | ICD-10-CM | POA: Diagnosis not present

## 2019-05-25 DIAGNOSIS — Z79899 Other long term (current) drug therapy: Secondary | ICD-10-CM | POA: Diagnosis not present

## 2019-05-25 DIAGNOSIS — I10 Essential (primary) hypertension: Secondary | ICD-10-CM | POA: Diagnosis not present

## 2019-05-25 DIAGNOSIS — E559 Vitamin D deficiency, unspecified: Secondary | ICD-10-CM | POA: Diagnosis not present

## 2019-05-25 DIAGNOSIS — E1140 Type 2 diabetes mellitus with diabetic neuropathy, unspecified: Secondary | ICD-10-CM | POA: Diagnosis not present

## 2019-06-11 DIAGNOSIS — E538 Deficiency of other specified B group vitamins: Secondary | ICD-10-CM | POA: Diagnosis not present

## 2019-06-11 DIAGNOSIS — E1140 Type 2 diabetes mellitus with diabetic neuropathy, unspecified: Secondary | ICD-10-CM | POA: Diagnosis not present

## 2019-06-11 DIAGNOSIS — Z79899 Other long term (current) drug therapy: Secondary | ICD-10-CM | POA: Diagnosis not present

## 2019-06-11 DIAGNOSIS — M109 Gout, unspecified: Secondary | ICD-10-CM | POA: Diagnosis not present

## 2019-06-11 DIAGNOSIS — J309 Allergic rhinitis, unspecified: Secondary | ICD-10-CM | POA: Diagnosis not present

## 2019-06-11 DIAGNOSIS — M1990 Unspecified osteoarthritis, unspecified site: Secondary | ICD-10-CM | POA: Diagnosis not present

## 2019-06-11 DIAGNOSIS — R413 Other amnesia: Secondary | ICD-10-CM | POA: Diagnosis not present

## 2019-06-12 DIAGNOSIS — E538 Deficiency of other specified B group vitamins: Secondary | ICD-10-CM | POA: Diagnosis not present

## 2019-06-12 DIAGNOSIS — Z79899 Other long term (current) drug therapy: Secondary | ICD-10-CM | POA: Diagnosis not present

## 2019-07-12 DIAGNOSIS — M1990 Unspecified osteoarthritis, unspecified site: Secondary | ICD-10-CM | POA: Diagnosis not present

## 2019-07-12 DIAGNOSIS — R413 Other amnesia: Secondary | ICD-10-CM | POA: Diagnosis not present

## 2019-07-12 DIAGNOSIS — M545 Low back pain: Secondary | ICD-10-CM | POA: Diagnosis not present

## 2019-07-12 DIAGNOSIS — I10 Essential (primary) hypertension: Secondary | ICD-10-CM | POA: Diagnosis not present

## 2019-07-12 DIAGNOSIS — E785 Hyperlipidemia, unspecified: Secondary | ICD-10-CM | POA: Diagnosis not present

## 2019-08-13 DIAGNOSIS — E1140 Type 2 diabetes mellitus with diabetic neuropathy, unspecified: Secondary | ICD-10-CM | POA: Diagnosis not present

## 2019-08-13 DIAGNOSIS — I10 Essential (primary) hypertension: Secondary | ICD-10-CM | POA: Diagnosis not present

## 2019-08-13 DIAGNOSIS — M545 Low back pain: Secondary | ICD-10-CM | POA: Diagnosis not present

## 2019-08-13 DIAGNOSIS — K219 Gastro-esophageal reflux disease without esophagitis: Secondary | ICD-10-CM | POA: Diagnosis not present

## 2019-08-13 DIAGNOSIS — H00019 Hordeolum externum unspecified eye, unspecified eyelid: Secondary | ICD-10-CM | POA: Diagnosis not present

## 2019-08-13 DIAGNOSIS — M1990 Unspecified osteoarthritis, unspecified site: Secondary | ICD-10-CM | POA: Diagnosis not present

## 2019-08-13 DIAGNOSIS — R600 Localized edema: Secondary | ICD-10-CM | POA: Diagnosis not present

## 2019-08-23 DIAGNOSIS — B351 Tinea unguium: Secondary | ICD-10-CM | POA: Diagnosis not present

## 2019-08-23 DIAGNOSIS — E1142 Type 2 diabetes mellitus with diabetic polyneuropathy: Secondary | ICD-10-CM | POA: Diagnosis not present

## 2019-08-23 DIAGNOSIS — Z7984 Long term (current) use of oral hypoglycemic drugs: Secondary | ICD-10-CM | POA: Diagnosis not present

## 2019-09-06 DIAGNOSIS — I10 Essential (primary) hypertension: Secondary | ICD-10-CM | POA: Diagnosis not present

## 2019-09-06 DIAGNOSIS — Z9114 Patient's other noncompliance with medication regimen: Secondary | ICD-10-CM | POA: Diagnosis not present

## 2019-09-06 DIAGNOSIS — L02621 Furuncle of right foot: Secondary | ICD-10-CM | POA: Diagnosis not present

## 2019-09-06 DIAGNOSIS — Z5329 Procedure and treatment not carried out because of patient's decision for other reasons: Secondary | ICD-10-CM | POA: Diagnosis not present

## 2019-09-14 DIAGNOSIS — Z23 Encounter for immunization: Secondary | ICD-10-CM | POA: Diagnosis not present

## 2019-09-14 DIAGNOSIS — Z79899 Other long term (current) drug therapy: Secondary | ICD-10-CM | POA: Diagnosis not present

## 2019-09-14 DIAGNOSIS — E559 Vitamin D deficiency, unspecified: Secondary | ICD-10-CM | POA: Diagnosis not present

## 2019-09-14 DIAGNOSIS — E785 Hyperlipidemia, unspecified: Secondary | ICD-10-CM | POA: Diagnosis not present

## 2019-09-14 DIAGNOSIS — E1140 Type 2 diabetes mellitus with diabetic neuropathy, unspecified: Secondary | ICD-10-CM | POA: Diagnosis not present

## 2019-09-17 DIAGNOSIS — S99921A Unspecified injury of right foot, initial encounter: Secondary | ICD-10-CM | POA: Diagnosis not present

## 2019-09-17 DIAGNOSIS — S92911A Unspecified fracture of right toe(s), initial encounter for closed fracture: Secondary | ICD-10-CM | POA: Diagnosis not present

## 2019-09-17 DIAGNOSIS — S8992XA Unspecified injury of left lower leg, initial encounter: Secondary | ICD-10-CM | POA: Diagnosis not present

## 2019-09-17 DIAGNOSIS — S8000XA Contusion of unspecified knee, initial encounter: Secondary | ICD-10-CM | POA: Diagnosis not present

## 2019-09-17 DIAGNOSIS — M25571 Pain in right ankle and joints of right foot: Secondary | ICD-10-CM | POA: Diagnosis not present

## 2019-09-17 DIAGNOSIS — N189 Chronic kidney disease, unspecified: Secondary | ICD-10-CM | POA: Diagnosis not present

## 2019-09-17 DIAGNOSIS — M79671 Pain in right foot: Secondary | ICD-10-CM | POA: Diagnosis not present

## 2019-09-17 DIAGNOSIS — S299XXA Unspecified injury of thorax, initial encounter: Secondary | ICD-10-CM | POA: Diagnosis not present

## 2019-09-17 DIAGNOSIS — M25562 Pain in left knee: Secondary | ICD-10-CM | POA: Diagnosis not present

## 2019-09-17 DIAGNOSIS — I129 Hypertensive chronic kidney disease with stage 1 through stage 4 chronic kidney disease, or unspecified chronic kidney disease: Secondary | ICD-10-CM | POA: Diagnosis not present

## 2019-09-18 ENCOUNTER — Other Ambulatory Visit: Payer: Self-pay

## 2019-09-18 ENCOUNTER — Encounter: Payer: Self-pay | Admitting: Cardiology

## 2019-09-18 ENCOUNTER — Ambulatory Visit (INDEPENDENT_AMBULATORY_CARE_PROVIDER_SITE_OTHER): Payer: PPO | Admitting: Cardiology

## 2019-09-18 VITALS — BP 154/58 | HR 76 | Ht 64.0 in | Wt 197.0 lb

## 2019-09-18 DIAGNOSIS — H81399 Other peripheral vertigo, unspecified ear: Secondary | ICD-10-CM | POA: Diagnosis not present

## 2019-09-18 DIAGNOSIS — R296 Repeated falls: Secondary | ICD-10-CM | POA: Diagnosis not present

## 2019-09-18 DIAGNOSIS — R42 Dizziness and giddiness: Secondary | ICD-10-CM | POA: Insufficient documentation

## 2019-09-18 DIAGNOSIS — I6529 Occlusion and stenosis of unspecified carotid artery: Secondary | ICD-10-CM | POA: Diagnosis not present

## 2019-09-18 DIAGNOSIS — I480 Paroxysmal atrial fibrillation: Secondary | ICD-10-CM | POA: Diagnosis not present

## 2019-09-18 DIAGNOSIS — I110 Hypertensive heart disease with heart failure: Secondary | ICD-10-CM | POA: Diagnosis not present

## 2019-09-18 DIAGNOSIS — I5032 Chronic diastolic (congestive) heart failure: Secondary | ICD-10-CM | POA: Diagnosis not present

## 2019-09-18 DIAGNOSIS — I25119 Atherosclerotic heart disease of native coronary artery with unspecified angina pectoris: Secondary | ICD-10-CM | POA: Diagnosis not present

## 2019-09-18 MED ORDER — CLONIDINE HCL 0.1 MG PO TABS: 0.1000 mg | ORAL_TABLET | Freq: Every day | ORAL | 1 refills | Status: DC

## 2019-09-18 MED ORDER — AMLODIPINE BESYLATE 10 MG PO TABS: 10.0000 mg | ORAL_TABLET | ORAL | Status: DC

## 2019-09-18 MED ORDER — TORSEMIDE 20 MG PO TABS: ORAL_TABLET | ORAL | 3 refills | Status: DC

## 2019-09-18 NOTE — Patient Instructions (Addendum)
Medication Instructions:  Your physician has recommended you make the following change in your medication:   DECREASE amlodipine (norvasc) 10 mg: Take 1 tablet every other day DECREASE torsemide (demadex) 20 mg: Take 1 tablet on Monday, Wednesday, and Friday only  START clonidine (catapres) 0.1 mg: Take 1 tablet daily at bedtime  *If you need a refill on your cardiac medications before your next appointment, please call your pharmacy*  Lab Work: None  If you have labs (blood work) drawn today and your tests are completely normal, you will receive your results only by: Marland Kitchen MyChart Message (if you have MyChart) OR . A paper copy in the mail If you have any lab test that is abnormal or we need to change your treatment, we will call you to review the results.  Testing/Procedures: You had an EKG today.   Your physician has requested that you have an echocardiogram. Echocardiography is a painless test that uses sound waves to create images of your heart. It provides your doctor with information about the size and shape of your heart and how well your heart's chambers and valves are working. This procedure takes approximately one hour. There are no restrictions for this procedure.  Your physician has requested that you have a carotid duplex. This test is an ultrasound of the carotid arteries in your neck. It looks at blood flow through these arteries that supply the brain with blood. Allow one hour for this exam. There are no restrictions or special instructions.  Non-Cardiac CT scanning, (CAT scanning), is a noninvasive, special x-ray that produces cross-sectional images of the body using x-rays and a computer. CT scans help physicians diagnose and treat medical conditions. For some CT exams, a contrast material is used to enhance visibility in the area of the body being studied. CT scans provide greater clarity and reveal more details than regular x-ray exams. You will be contacted to schedule your  CT head without contrast after insurance approval is received.   Follow-Up: At [facility], you and your health needs are our priority.  As part of our continuing mission to provide you with exceptional heart care, we have created designated Provider Care Teams.  These Care Teams include your primary Cardiologist (physician) and Advanced Practice Providers (APPs -  Physician Assistants and Nurse Practitioners) who all work together to provide you with the care you need, when you need it.  Your next appointment:   3 week(s)  The format for your next appointment:   In Person  Provider:   Laurann Montana, FNP [facility])  Other Instructions **Check your blood pressure at home daily and record these readings. Bring your BP log to your next appointment with Laurann Montana, NP to review.   **Dr. Bettina Gavia recommends patient have 24 hour supervision due to safety concerns related to falls and dizziness.     Clonidine tablets What is this medicine? CLONIDINE (KLOE ni deen) is used to treat high blood pressure. This medicine may be used for other purposes; ask your health care provider or pharmacist if you have questions. COMMON BRAND NAME(S): Catapres What should I tell my health care provider before I take this medicine? They need to know if you have any of these conditions:  kidney disease  an unusual or allergic reaction to clonidine, other medicines, foods, dyes, or preservatives  pregnant or trying to get pregnant  breast-feeding How should I use this medicine? Take this medicine by mouth with a glass of water. Follow the directions  on the prescription label. Take your doses at regular intervals. Do not take your medicine more often than directed. Do not suddenly stop taking this medicine. You must gradually reduce the dose or you may get a dangerous increase in blood pressure. Ask your doctor or health care professional for advice. Talk to your pediatrician regarding the  use of this medicine in children. Special care may be needed. Overdosage: If you think you have taken too much of this medicine contact a poison control center or emergency room at once. NOTE: This medicine is only for you. Do not share this medicine with others. What if I miss a dose? If you miss a dose, take it as soon as you can. If it is almost time for your next dose, take only that dose. Do not take double or extra doses. What may interact with this medicine? Do not take this medicine with any of the following medications:  MAOIs like Carbex, Eldepryl, Marplan, Nardil, and Parnate This medicine may also interact with the following medications:  barbiturate medicines for inducing sleep or treating seizures like phenobarbital  certain medicines for blood pressure, heart disease, irregular heart beat  certain medicines for depression, anxiety, or psychotic disturbances  prescription pain medicines This list may not describe all possible interactions. Give your health care provider a list of all the medicines, herbs, non-prescription drugs, or dietary supplements you use. Also tell them if you smoke, drink alcohol, or use illegal drugs. Some items may interact with your medicine. What should I watch for while using this medicine? Visit your doctor or health care professional for regular checks on your progress. Check your heart rate and blood pressure regularly while you are taking this medicine. Ask your doctor or health care professional what your heart rate should be and when you should contact him or her. You may get drowsy or dizzy. Do not drive, use machinery, or do anything that needs mental alertness until you know how this medicine affects you. To avoid dizzy or fainting spells, do not stand or sit up quickly, especially if you are an older person. Alcohol can make you more drowsy and dizzy. Avoid alcoholic drinks. Your mouth may get dry. Chewing sugarless gum or sucking hard candy,  and drinking plenty of water will help. Do not treat yourself for coughs, colds, or pain while you are taking this medicine without asking your doctor or health care professional for advice. Some ingredients may increase your blood pressure. If you are going to have surgery tell your doctor or health care professional that you are taking this medicine. What side effects may I notice from receiving this medicine? Side effects that you should report to your doctor or health care professional as soon as possible:  allergic reactions like skin rash, itching or hives, swelling of the face, lips, or tongue  anxiety, nervousness  chest pain  depression  fast, irregular heartbeat  swelling of feet or legs  unusually weak or tired Side effects that usually do not require medical attention (report to your doctor or health care professional if they continue or are bothersome):  change in sex drive or performance  constipation  headache This list may not describe all possible side effects. Call your doctor for medical advice about side effects. You may report side effects to FDA at 1-800-FDA-1088. Where should I keep my medicine? Keep out of the reach of children. Store at room temperature between 15 and 30 degrees C (59 and 86 degrees F).  Protect from light. Keep container tightly closed. Throw away any unused medicine after the expiration date. NOTE: This sheet is a summary. It may not cover all possible information. If you have questions about this medicine, talk to your doctor, pharmacist, or health care provider.  2020 Elsevier/Gold Standard (2011-04-07 13:01:28)

## 2019-09-18 NOTE — Progress Notes (Signed)
Cardiology Office Note:    Date:  09/18/2019   ID:  Cheryl Summers, DOB 01-07-1941, MRN [id number]  PCP:  Nicholos Johns, MD  Cardiologist:  Shirlee More, MD    Referring MD: Nicholos Johns, MD    ASSESSMENT:    1. Hypertensive heart disease with heart failure ([place])   2. PAF (paroxysmal atrial fibrillation) ([place])   3. Chronic diastolic heart failure ([place])   4. Coronary artery disease involving native coronary artery of native heart with angina pectoris ([place])    PLAN:    In order of problems listed above:  1. Both poorly controlled with worsened renal function and having edema related to amlodipine see changes below goal blood pressure 1 [id number] 50 systolic and recheck in a few weeks 2. Stable in sinus rhythm not anticoagulated with falls, risk for stroke check CT brain 3. Elevated proBNP peripheral edema worsen renal function check echocardiogram for indices of systolic and diastolic failure I am going to decrease her diuretic at this time\ 4. Stable CAD continue medical treatment 5. Left subclavian stenosis check carotid duplex regarding steal physiology   Next appointment: 3 weeks with [place] office   Medication Adjustments/Labs and Tests Ordered: Current medicines are reviewed at length with the patient today.  Concerns regarding medicines are outlined above.  No orders of the defined types were placed in this encounter.  No orders of the defined types were placed in this encounter.   Chief Complaint  Patient presents with   Follow-up    After [facility] health ED visit yesterday    History of Present Illness:    Cheryl Summers is a 78 y.o. female with a hx of CABG in 2012,, CHF, Atrial Fibrillation-CHADS2 vasc score=5, bradycardia, hypertension, and hyperlipidemia.She is not anticoagulated due to gait dysfunction.  She was last seen 05/02/2018 prior to LS surgery. Compliance with diet, lifestyle and medications: Yes supervised by her daughter  at home  Is a very complicated visit she went to [facility] yesterday we got the records are prompted it was she has had multiple falls and she is having pain in her right foot and has a fractured toe.  In the emergency room she was noted to have a blood pressure discrepancy between the 2 arms of 50 mmHg.  Daughter was worried because she has noticed that she has been swelling and she takes both Lyrica and high dose amlodipine.  Patient complains of being unsteady and weak has had frequent falls but has had no headache no orthostatic symptoms no vertigo.  Not having claudication left arm no chest pain palpitation or syncope.  She comes to my office and has a marked blood pressure shift 60 mm between the right upper left upper extremity and she has no orthostatic shifting blood pressure.  Her BP is poorly controlled she has worsening CKD.  Should decrease her diuretic dosage decrease her calcium channel blocker and I will add Catapres at bedtime for BP control I asked the family to check blood pressure daily right arm and record and I have her back in our office in a few weeks and recheck her renal function.  Daughter asked if she should have a CT of her head with the falls and unsteady gait worried her mother had a stroke I agreed and I will order one.  Were concerned about subclavian steal syndrome and will do cerebrovascular studies in [place] office.  I do not think she requires a heart  rhythm monitor at this time or cardiac ischemia evaluation Past Medical History:  Diagnosis Date   Anxiety    Carotid artery stenosis, asymptomatic    50% ASYMPTOMATIC  RIGHT CAROTID STENOSIS   Chronic diastolic heart failure ([place]) 06/09/2015   COPD (chronic obstructive pulmonary disease) (HCC)    Depression    Diabetes mellitus without complication (HCC)    Dysrhythmia    Gastroesophageal reflux    Hyperlipidemia    Hypertension    Hypertensive heart disease with heart failure ([place]) 06/09/2015     PAF (paroxysmal atrial fibrillation) ([place]) 06/09/2015   Overview:  CHADS2 vasc score=6   Renal insufficiency    Tobacco abuse    Tremors of nervous system     Past Surgical History:  Procedure Laterality Date   ABDOMINAL HYSTERECTOMY     CORONARY ARTERY BYPASS GRAFT  2012   NOSE SURGERY      Current Medications: Current Meds  Medication Sig   albuterol (PROVENTIL HFA;VENTOLIN HFA) 108 (90 Base) MCG/ACT inhaler Inhale 2 puffs into the lungs every 6 (six) hours as needed for wheezing or shortness of breath.   albuterol (PROVENTIL) (2.5 MG/3ML) 0.083% nebulizer solution Take 2.5 mg by nebulization daily as needed for wheezing or shortness of breath.   allopurinol (ZYLOPRIM) 100 MG tablet Take 100 mg by mouth daily as needed.   amLODipine (NORVASC) 10 MG tablet Take 10 mg by mouth daily.     Artificial Tear Solution (SOOTHE XP) SOLN Place 1 drop into both eyes daily as needed (dry eyes).   aspirin EC 81 MG tablet Take 81 mg by mouth daily.   atorvastatin (LIPITOR) 20 MG tablet Take 20 mg by mouth at bedtime.    budesonide-formoterol (SYMBICORT) 160-4.5 MCG/ACT inhaler Inhale 2 puffs into the lungs 2 (two) times daily as needed (shortness of breath).    buPROPion (WELLBUTRIN XL) 150 MG 24 hr tablet Take 150 mg by mouth at bedtime.    dapagliflozin propanediol (FARXIGA) 5 MG TABS tablet Take 5 mg by mouth daily.    donepezil (ARICEPT) 10 MG tablet Take 10 mg by mouth at bedtime.   doxycycline (VIBRAMYCIN) 100 MG capsule Take 100 mg by mouth 2 (two) times daily.   ezetimibe (ZETIA) 10 MG tablet Take 5 mg by mouth at bedtime.    memantine (NAMENDA XR) 28 MG CP24 24 hr capsule Take 1 capsule by mouth daily.   montelukast (SINGULAIR) 10 MG tablet Take 10 mg by mouth at bedtime.    Multiple Vitamin (MULTIVITAMIN) tablet Take 1 tablet by mouth daily.   nystatin-triamcinolone (MYCOLOG II) cream Apply 1 application topically daily as needed (skin bumps).   omeprazole  (PRILOSEC) 20 MG capsule Take 20 mg by mouth daily as needed (acid reflux).    polyethylene glycol (MIRALAX) packet Take 17 g by mouth daily.   pregabalin (LYRICA) 75 MG capsule Take 75 mg by mouth 2 (two) times daily.   quinapril (ACCUPRIL) 40 MG tablet Take 40 mg by mouth daily.   sertraline (ZOLOFT) 50 MG tablet Take 50 mg by mouth at bedtime.    sodium chloride (OCEAN) 0.65 % SOLN nasal spray Place 1 spray into both nostrils as needed for congestion.   telmisartan (MICARDIS) 80 MG tablet Take 80 mg by mouth daily.   tiZANidine (ZANAFLEX) 2 MG tablet Take 2 mg by mouth at bedtime.    torsemide (DEMADEX) 20 MG tablet Take 1 tablet (20 mg total) by mouth daily.   Current Facility-Administered Medications for the  09/18/19 encounter (Office Visit) with Richardo Priest, MD  Medication   betamethasone acetate-betamethasone sodium phosphate (CELESTONE) injection 12 mg     Allergies:   Patient has no known allergies.   Social History   Socioeconomic History   Marital status: Widowed    Spouse name: Not on file   Number of children: Not on file   Years of education: Not on file   Highest education level: Not on file  Occupational History   Not on file  Social Needs   Financial resource strain: Not on file   Food insecurity    Worry: Not on file    Inability: Not on file   Transportation needs    Medical: Not on file    Non-medical: Not on file  Tobacco Use   Smoking status: Former Smoker   Smokeless tobacco: Never Used  Substance and Sexual Activity   Alcohol use: No   Drug use: No   Sexual activity: Not on file  Lifestyle   Physical activity    Days per week: Not on file    Minutes per session: Not on file   Stress: Not on file  Relationships   Social connections    Talks on phone: Not on file    Gets together: Not on file    Attends religious service: Not on file    Active member of club or organization: Not on file    Attends meetings of  clubs or organizations: Not on file    Relationship status: Not on file  Other Topics Concern   Not on file  Social History Narrative   Not on file     Family History: The patient's family history includes Heart attack in her brother, father, mother, and sister; Hyperlipidemia in her father and mother; Hypertension in her daughter, father, and mother; Stroke in her brother. ROS:   Please see the history of present illness.    All other systems reviewed and are negative.  EKGs/Labs/Other Studies Reviewed:    The following studies were reviewed today:  EKG:  EKG ordered today and personally reviewed.  The ekg ordered today demonstrates sinus rhythm normal EKG  Recent Labs: Yesterday at [facility] hemoglobin 12.4 creatinine 2.0 GFR 24 cc potassium 4.6 proBNP P level was moderately elevated 890 x-rays reviewed EKG yesterday similar in appearance  Physical Exam:    VS:  BP (!) 154/58 (BP Location: Right Arm, Patient Position: Sitting)    Pulse 76    Ht 5\' 4"  (1.626 m)    Wt 197 lb (89.4 kg)    SpO2 92%    BMI 33.81 kg/m     Wt Readings from Last 3 Encounters:  09/18/19 197 lb (89.4 kg)  12/25/18 213 lb (96.6 kg)  09/08/18 213 lb (96.6 kg)    Blood pressure by me 170/70 right upper extremity normal size cuff sitting, 160/60 standing GEN: She is very weak and her daughter has to assist her to stand well nourished, well developed in no acute distress HEENT: Normal NECK: No JVD; No carotid bruits LYMPHATICS: No lymphadenopathy CARDIAC: RRR, no murmurs, rubs, gallops RESPIRATORY:  Clear to auscultation without rales, wheezing or rhonchi  ABDOMEN: Soft, non-tender, non-distended MUSCULOSKELETAL: Plus bilateral lower extremity edema edema; No deformity  SKIN: Warm and dry NEUROLOGIC:  Alert and oriented x 3 PSYCHIATRIC:  Normal affect    Signed, Shirlee More, MD  09/18/2019 11:31 AM    [place]

## 2019-09-19 NOTE — Addendum Note (Signed)
Addended by: Jerl Santos R on: 09/19/2019 01:54 PM   Modules accepted: Orders

## 2019-09-24 ENCOUNTER — Other Ambulatory Visit: Payer: Self-pay

## 2019-09-24 ENCOUNTER — Ambulatory Visit (HOSPITAL_BASED_OUTPATIENT_CLINIC_OR_DEPARTMENT_OTHER)
Admission: RE | Admit: 2019-09-24 | Discharge: 2019-09-24 | Disposition: A | Discharge status: Home / Self Care | Payer: PPO | Source: Ambulatory Visit | Attending: Cardiology | Admitting: Cardiology

## 2019-09-24 DIAGNOSIS — I5032 Chronic diastolic (congestive) heart failure: Secondary | ICD-10-CM | POA: Diagnosis not present

## 2019-09-24 DIAGNOSIS — I25119 Atherosclerotic heart disease of native coronary artery with unspecified angina pectoris: Secondary | ICD-10-CM | POA: Diagnosis not present

## 2019-09-24 DIAGNOSIS — I6529 Occlusion and stenosis of unspecified carotid artery: Secondary | ICD-10-CM | POA: Diagnosis not present

## 2019-09-24 DIAGNOSIS — I110 Hypertensive heart disease with heart failure: Secondary | ICD-10-CM

## 2019-09-24 DIAGNOSIS — R296 Repeated falls: Secondary | ICD-10-CM

## 2019-09-24 DIAGNOSIS — H81399 Other peripheral vertigo, unspecified ear: Secondary | ICD-10-CM | POA: Insufficient documentation

## 2019-09-24 DIAGNOSIS — R42 Dizziness and giddiness: Secondary | ICD-10-CM

## 2019-09-24 DIAGNOSIS — I480 Paroxysmal atrial fibrillation: Secondary | ICD-10-CM

## 2019-09-24 NOTE — Progress Notes (Signed)
Carotid doppler performed   Cardell Peach 09/24/19 RDCS, RVT

## 2019-09-24 NOTE — Progress Notes (Signed)
  Echocardiogram 2D Echocardiogram has been performed.  Cheryl Summers 09/24/2019, 3:18 PM

## 2019-09-25 ENCOUNTER — Telehealth: Payer: Self-pay | Admitting: Cardiology

## 2019-09-25 NOTE — Telephone Encounter (Signed)
Spoke with patient's daughter regarding elevated blood pressures.  [name] states home blood pressures have been 160/44, 162/52, 162/60, 182/58, 190/52.  She is very concerned with these readings and wanted Dr Bettina Gavia to review.

## 2019-09-25 NOTE — Telephone Encounter (Signed)
Please call regarding high blood pressure, reading today was 182/58

## 2019-09-26 NOTE — Telephone Encounter (Signed)
Left message for patient's daughter, [name], per DPR to return call to offer a sooner follow up appointment with Urban Gibson, NP for further evaluation of elevated blood pressure readings.

## 2019-09-26 NOTE — Telephone Encounter (Signed)
[name] returned call and states that patient's PCP, Dr. Rica Records, typically manages patient's blood pressure. Advised her to contact Dr. Lauralee Evener office to schedule an appointment to evaluate patient's BP. Patient will keep her appointment scheduled with Urban Gibson, NP on 10/10/2019 in the [facility] office at 3:45 pm. [name] is agreeable and verbalized understanding. No further questions.

## 2019-09-26 NOTE — Telephone Encounter (Signed)
She should have office FU with CW

## 2019-09-27 DIAGNOSIS — Z9181 History of falling: Secondary | ICD-10-CM | POA: Diagnosis not present

## 2019-09-27 DIAGNOSIS — M1990 Unspecified osteoarthritis, unspecified site: Secondary | ICD-10-CM | POA: Diagnosis not present

## 2019-09-27 DIAGNOSIS — Z79899 Other long term (current) drug therapy: Secondary | ICD-10-CM | POA: Diagnosis not present

## 2019-09-27 DIAGNOSIS — I10 Essential (primary) hypertension: Secondary | ICD-10-CM | POA: Diagnosis not present

## 2019-09-27 DIAGNOSIS — N189 Chronic kidney disease, unspecified: Secondary | ICD-10-CM | POA: Diagnosis not present

## 2019-09-27 DIAGNOSIS — I951 Orthostatic hypotension: Secondary | ICD-10-CM | POA: Diagnosis not present

## 2019-09-27 DIAGNOSIS — S92919A Unspecified fracture of unspecified toe(s), initial encounter for closed fracture: Secondary | ICD-10-CM | POA: Diagnosis not present

## 2019-09-27 DIAGNOSIS — E538 Deficiency of other specified B group vitamins: Secondary | ICD-10-CM | POA: Diagnosis not present

## 2019-10-02 DIAGNOSIS — F418 Other specified anxiety disorders: Secondary | ICD-10-CM | POA: Diagnosis not present

## 2019-10-02 DIAGNOSIS — S92911D Unspecified fracture of right toe(s), subsequent encounter for fracture with routine healing: Secondary | ICD-10-CM | POA: Diagnosis not present

## 2019-10-02 DIAGNOSIS — R413 Other amnesia: Secondary | ICD-10-CM | POA: Diagnosis not present

## 2019-10-02 DIAGNOSIS — I951 Orthostatic hypotension: Secondary | ICD-10-CM | POA: Diagnosis not present

## 2019-10-02 DIAGNOSIS — J449 Chronic obstructive pulmonary disease, unspecified: Secondary | ICD-10-CM | POA: Diagnosis not present

## 2019-10-02 DIAGNOSIS — E1122 Type 2 diabetes mellitus with diabetic chronic kidney disease: Secondary | ICD-10-CM | POA: Diagnosis not present

## 2019-10-02 DIAGNOSIS — E669 Obesity, unspecified: Secondary | ICD-10-CM | POA: Diagnosis not present

## 2019-10-02 DIAGNOSIS — I2510 Atherosclerotic heart disease of native coronary artery without angina pectoris: Secondary | ICD-10-CM | POA: Diagnosis not present

## 2019-10-02 DIAGNOSIS — E538 Deficiency of other specified B group vitamins: Secondary | ICD-10-CM | POA: Diagnosis not present

## 2019-10-02 DIAGNOSIS — J309 Allergic rhinitis, unspecified: Secondary | ICD-10-CM | POA: Diagnosis not present

## 2019-10-02 DIAGNOSIS — M1990 Unspecified osteoarthritis, unspecified site: Secondary | ICD-10-CM | POA: Diagnosis not present

## 2019-10-02 DIAGNOSIS — R252 Cramp and spasm: Secondary | ICD-10-CM | POA: Diagnosis not present

## 2019-10-02 DIAGNOSIS — E785 Hyperlipidemia, unspecified: Secondary | ICD-10-CM | POA: Diagnosis not present

## 2019-10-02 DIAGNOSIS — M545 Low back pain: Secondary | ICD-10-CM | POA: Diagnosis not present

## 2019-10-02 DIAGNOSIS — D631 Anemia in chronic kidney disease: Secondary | ICD-10-CM | POA: Diagnosis not present

## 2019-10-02 DIAGNOSIS — R0989 Other specified symptoms and signs involving the circulatory and respiratory systems: Secondary | ICD-10-CM | POA: Diagnosis not present

## 2019-10-02 DIAGNOSIS — N189 Chronic kidney disease, unspecified: Secondary | ICD-10-CM | POA: Diagnosis not present

## 2019-10-02 DIAGNOSIS — Z7951 Long term (current) use of inhaled steroids: Secondary | ICD-10-CM | POA: Diagnosis not present

## 2019-10-02 DIAGNOSIS — R001 Bradycardia, unspecified: Secondary | ICD-10-CM | POA: Diagnosis not present

## 2019-10-02 DIAGNOSIS — E041 Nontoxic single thyroid nodule: Secondary | ICD-10-CM | POA: Diagnosis not present

## 2019-10-02 DIAGNOSIS — I129 Hypertensive chronic kidney disease with stage 1 through stage 4 chronic kidney disease, or unspecified chronic kidney disease: Secondary | ICD-10-CM | POA: Diagnosis not present

## 2019-10-02 DIAGNOSIS — Z7982 Long term (current) use of aspirin: Secondary | ICD-10-CM | POA: Diagnosis not present

## 2019-10-02 DIAGNOSIS — F39 Unspecified mood [affective] disorder: Secondary | ICD-10-CM | POA: Diagnosis not present

## 2019-10-02 DIAGNOSIS — E1140 Type 2 diabetes mellitus with diabetic neuropathy, unspecified: Secondary | ICD-10-CM | POA: Diagnosis not present

## 2019-10-02 DIAGNOSIS — N3281 Overactive bladder: Secondary | ICD-10-CM | POA: Diagnosis not present

## 2019-10-08 NOTE — Progress Notes (Signed)
Cardiology Office Note:    Date:  10/09/2019   ID:  Cheryl Summers, DOB 16-Aug-1941, MRN [id number]  PCP:  Nicholos Johns, MD  Cardiologist:  Shirlee More, MD    Referring MD: Nicholos Johns, MD    ASSESSMENT:    1. Stenosis of left subclavian artery (HCC)   2. Hypertensive heart disease with heart failure ([place])   3. PAF (paroxysmal atrial fibrillation) ([place])   4. Coronary artery disease involving native coronary artery of native heart with angina pectoris ([place])   5. Meningioma ([place])    PLAN:    In order of problems listed above:  1. Stable asymptomatic without subclavian steal symptoms, all BPs right upper extremity 2. Improved at this time I would not further increase her antihypertensives Home blood pressures under good range. 3. Stable maintaining sinus rhythm not anticoagulated due to falls and gait dysfunction 4. Stable CAD New York Heart Association class I 5. With gait dysfunction and falls she needs to be seen by neurology I reinforced this with the patient and her daughter   Next appointment: 6 months   Medication Adjustments/Labs and Tests Ordered: Current medicines are reviewed at length with the patient today.  Concerns regarding medicines are outlined above.  No orders of the defined types were placed in this encounter.  No orders of the defined types were placed in this encounter.   Chief Complaint  Patient presents with  . Follow-up     History of Present Illness:    Cheryl Summers is a 78 y.o. female with a hx of CABG in 2012,, CHF, Atrial Fibrillation-CHADS2 vasc score=5, bradycardia, hypertension, and hyperlipidemia.She is not anticoagulated due to gait dysfunction.   She was last seen 09/18/2019.  At that time she was seen after having multiple falls [place] ED visit in a discrepancy with a blood pressure difference left arm less than the right 50 mmHg.  CT of the head was performed showing a calcified meningioma in the posterior left parietal  region 1.8 cm.  A carotid duplex showed 40 to 59% bilateral ICA stenosis left subclavian artery stenosis and bidirectional flow in the left vertebral artery, subclavian steal physiology.  Echocardiogram showed normal ejection fraction 60 to 65% both atria are normal in size no significant valvular abnormality. Compliance with diet, lifestyle and medications: Yes  Recently diuretic calcium channel blocker clonidine all increase in home blood pressure consistently is running 150/40-50.  At this time I would not further increase her antihypertensives.  Her daughter supervises her is present today checks blood pressure daily she has had no recurrent syncope TIA shortness of breath edema chest pain or palpitation.  Despite recommendations she has not been seen by a neurologist.  I explained to them that she has mild carotid disease as left subclavian stenosis asymptomatic.  All BPs right upper extremity. Past Medical History:  Diagnosis Date  . Anxiety   . Carotid artery stenosis, asymptomatic    50% ASYMPTOMATIC  RIGHT CAROTID STENOSIS  . Chronic diastolic heart failure ([place]) 06/09/2015  . COPD (chronic obstructive pulmonary disease) ([place])   . Depression   . Diabetes mellitus without complication ([place])   . Dysrhythmia   . Gastroesophageal reflux   . Hyperlipidemia   . Hypertension   . Hypertensive heart disease with heart failure ([place]) 06/09/2015  . PAF (paroxysmal atrial fibrillation) ([place]) 06/09/2015   Overview:  CHADS2 vasc score=6  . Renal insufficiency   . Tobacco abuse   . Tremors of nervous system  Past Surgical History:  Procedure Laterality Date  . ABDOMINAL HYSTERECTOMY    . CORONARY ARTERY BYPASS GRAFT  2012  . NOSE SURGERY      Current Medications: Current Meds  Medication Sig  . albuterol (PROVENTIL HFA;VENTOLIN HFA) 108 (90 Base) MCG/ACT inhaler Inhale 2 puffs into the lungs every 6 (six) hours as needed for wheezing or shortness of breath.  Marland Kitchen albuterol (PROVENTIL) (2.5  MG/3ML) 0.083% nebulizer solution Take 2.5 mg by nebulization daily as needed for wheezing or shortness of breath.  . allopurinol (ZYLOPRIM) 100 MG tablet Take 100 mg by mouth daily as needed.  Marland Kitchen amLODipine (NORVASC) 10 MG tablet Take 1 tablet (10 mg total) by mouth every other day. (Patient taking differently: Take 5 mg by mouth daily. )  . Artificial Tear Solution (SOOTHE XP) SOLN Place 1 drop into both eyes daily as needed (dry eyes).  Marland Kitchen aspirin EC 81 MG tablet Take 81 mg by mouth daily.  Marland Kitchen atorvastatin (LIPITOR) 20 MG tablet Take 20 mg by mouth at bedtime.   . budesonide-formoterol (SYMBICORT) 160-4.5 MCG/ACT inhaler Inhale 2 puffs into the lungs 2 (two) times daily as needed (shortness of breath).   Marland Kitchen buPROPion (WELLBUTRIN XL) 150 MG 24 hr tablet Take 150 mg by mouth at bedtime.   . cloNIDine (CATAPRES) 0.1 MG tablet Take 1 tablet (0.1 mg total) by mouth daily. (Patient taking differently: Take 0.1 mg by mouth daily. Take 2 daily)  . dapagliflozin propanediol (FARXIGA) 5 MG TABS tablet Take 5 mg by mouth daily.   Marland Kitchen donepezil (ARICEPT) 10 MG tablet Take 10 mg by mouth at bedtime.  Marland Kitchen doxycycline (VIBRAMYCIN) 100 MG capsule Take 100 mg by mouth 2 (two) times daily.  Marland Kitchen ezetimibe (ZETIA) 10 MG tablet Take 5 mg by mouth at bedtime.   . memantine (NAMENDA XR) 28 MG CP24 24 hr capsule Take 1 capsule by mouth daily.  . montelukast (SINGULAIR) 10 MG tablet Take 10 mg by mouth at bedtime.   . Multiple Vitamin (MULTIVITAMIN) tablet Take 1 tablet by mouth daily.  Marland Kitchen nystatin-triamcinolone (MYCOLOG II) cream Apply 1 application topically daily as needed (skin bumps).  Marland Kitchen omeprazole (PRILOSEC) 20 MG capsule Take 20 mg by mouth daily as needed (acid reflux).   . polyethylene glycol (MIRALAX) packet Take 17 g by mouth daily.  . pregabalin (LYRICA) 75 MG capsule Take 75 mg by mouth 2 (two) times daily.  . quinapril (ACCUPRIL) 40 MG tablet Take 40 mg by mouth daily.  . sertraline (ZOLOFT) 50 MG tablet Take 50  mg by mouth at bedtime.   . sodium chloride (OCEAN) 0.65 % SOLN nasal spray Place 1 spray into both nostrils as needed for congestion.  Marland Kitchen telmisartan (MICARDIS) 80 MG tablet Take 80 mg by mouth daily.  Marland Kitchen tiZANidine (ZANAFLEX) 2 MG tablet Take 2 mg by mouth at bedtime.   . torsemide (DEMADEX) 20 MG tablet Take 1 tablet on Monday, Wednesday, Friday only. (Patient taking differently: Take 20 mg by mouth daily. Take 1 tablet on Monday, Wednesday, Friday only.)   Current Facility-Administered Medications for the 10/09/19 encounter (Office Visit) with Cheryl Priest, MD  Medication  . betamethasone acetate-betamethasone sodium phosphate (CELESTONE) injection 12 mg     Allergies:   Patient has no known allergies.   Social History   Socioeconomic History  . Marital status: Widowed    Spouse name: Not on file  . Number of children: Not on file  . Years of education: Not on  file  . Highest education level: Not on file  Occupational History  . Not on file  Tobacco Use  . Smoking status: Former Research scientist (life sciences)  . Smokeless tobacco: Never Used  Substance and Sexual Activity  . Alcohol use: No  . Drug use: No  . Sexual activity: Not on file  Other Topics Concern  . Not on file  Social History Narrative  . Not on file   Social Determinants of Health   Financial Resource Strain:   . Difficulty of Paying Living Expenses: Not on file  Food Insecurity:   . Worried About Charity fundraiser in the Last Year: Not on file  . Ran Out of Food in the Last Year: Not on file  Transportation Needs:   . Lack of Transportation (Medical): Not on file  . Lack of Transportation (Non-Medical): Not on file  Physical Activity:   . Days of Exercise per Week: Not on file  . Minutes of Exercise per Session: Not on file  Stress:   . Feeling of Stress : Not on file  Social Connections:   . Frequency of Communication with Friends and Family: Not on file  . Frequency of Social Gatherings with Friends and Family:  Not on file  . Attends Religious Services: Not on file  . Active Member of Clubs or Organizations: Not on file  . Attends Archivist Meetings: Not on file  . Marital Status: Not on file     Family History: The patient's family history includes Heart attack in her brother, father, mother, and sister; Hyperlipidemia in her father and mother; Hypertension in her daughter, father, and mother; Stroke in her brother. ROS:   Please see the history of present illness.    All other systems reviewed and are negative.  EKGs/Labs/Other Studies Reviewed:    The following studies were reviewed today:    Recent Labs: 09/14/2019: Cholesterol 121 HDL 55 LDL 57 A1c 5.5%   Physical Exam:    VS:  BP (!) 180/60 (BP Location: Right Arm, Patient Position: Sitting, Cuff Size: Large)   Pulse (!) 58   Ht 5\' 4"  (1.626 m)   Wt 198 lb (89.8 kg)   SpO2 96%   BMI 33.99 kg/m     Wt Readings from Last 3 Encounters:  10/09/19 198 lb (89.8 kg)  09/18/19 197 lb (89.4 kg)  12/25/18 213 lb (96.6 kg)     GEN:  Well nourished, well developed in no acute distress HEENT: Normal NECK: No JVD; No carotid bruits LYMPHATICS: No lymphadenopathy CARDIAC: RRR, no murmurs, rubs, gallops RESPIRATORY:  Clear to auscultation without rales, wheezing or rhonchi  ABDOMEN: Soft, non-tender, non-distended MUSCULOSKELETAL:  No edema; No deformity  SKIN: Warm and dry NEUROLOGIC:  Alert and oriented x 3 PSYCHIATRIC:  Normal affect    Signed, Shirlee More, MD  10/09/2019 2:01 PM    [place]

## 2019-10-09 ENCOUNTER — Ambulatory Visit (INDEPENDENT_AMBULATORY_CARE_PROVIDER_SITE_OTHER): Payer: PPO | Admitting: Cardiology

## 2019-10-09 ENCOUNTER — Encounter: Payer: Self-pay | Admitting: Cardiology

## 2019-10-09 ENCOUNTER — Other Ambulatory Visit: Payer: Self-pay

## 2019-10-09 VITALS — BP 180/60 | HR 58 | Ht 64.0 in | Wt 198.0 lb

## 2019-10-09 DIAGNOSIS — I110 Hypertensive heart disease with heart failure: Secondary | ICD-10-CM | POA: Diagnosis not present

## 2019-10-09 DIAGNOSIS — I771 Stricture of artery: Secondary | ICD-10-CM

## 2019-10-09 DIAGNOSIS — I480 Paroxysmal atrial fibrillation: Secondary | ICD-10-CM

## 2019-10-09 DIAGNOSIS — I25119 Atherosclerotic heart disease of native coronary artery with unspecified angina pectoris: Secondary | ICD-10-CM

## 2019-10-09 DIAGNOSIS — D329 Benign neoplasm of meninges, unspecified: Secondary | ICD-10-CM | POA: Diagnosis not present

## 2019-10-09 NOTE — Patient Instructions (Signed)
Medication Instructions:  Your physician recommends that you continue on your current medications as directed. Please refer to the Current Medication list given to you today.  *If you need a refill on your cardiac medications before your next appointment, please call your pharmacy*  Lab Work: None  If you have labs (blood work) drawn today and your tests are completely normal, you will receive your results only by: Marland Kitchen MyChart Message (if you have MyChart) OR . A paper copy in the mail If you have any lab test that is abnormal or we need to change your treatment, we will call you to review the results.  Testing/Procedures: None  Follow-Up: At [facility], you and your health needs are our priority.  As part of our continuing mission to provide you with exceptional heart care, we have created designated Provider Care Teams.  These Care Teams include your primary Cardiologist (physician) and Advanced Practice Providers (APPs -  Physician Assistants and Nurse Practitioners) who all work together to provide you with the care you need, when you need it.  Your next appointment:   6 month(s)  The format for your next appointment:   In Person  Provider:   Shirlee More, MD  Other Instructions **If you change your mind about the neurology referral, Dr. Bettina Gavia recommends you schedule an appointment with Dr. Arlice Colt with [facility].

## 2019-10-10 ENCOUNTER — Ambulatory Visit: Payer: PPO | Admitting: Cardiology

## 2019-10-10 ENCOUNTER — Ambulatory Visit: Payer: PPO | Admitting: Family

## 2019-10-11 DIAGNOSIS — N189 Chronic kidney disease, unspecified: Secondary | ICD-10-CM | POA: Diagnosis not present

## 2019-10-11 DIAGNOSIS — I771 Stricture of artery: Secondary | ICD-10-CM | POA: Diagnosis not present

## 2019-10-11 DIAGNOSIS — M1990 Unspecified osteoarthritis, unspecified site: Secondary | ICD-10-CM | POA: Diagnosis not present

## 2019-10-11 DIAGNOSIS — I951 Orthostatic hypotension: Secondary | ICD-10-CM | POA: Diagnosis not present

## 2019-10-11 DIAGNOSIS — S92919A Unspecified fracture of unspecified toe(s), initial encounter for closed fracture: Secondary | ICD-10-CM | POA: Diagnosis not present

## 2019-10-11 DIAGNOSIS — R930 Abnormal findings on diagnostic imaging of skull and head, not elsewhere classified: Secondary | ICD-10-CM | POA: Diagnosis not present

## 2019-10-11 DIAGNOSIS — I10 Essential (primary) hypertension: Secondary | ICD-10-CM | POA: Diagnosis not present

## 2019-11-01 ENCOUNTER — Ambulatory Visit: Payer: PPO | Admitting: Cardiology

## 2019-11-01 DIAGNOSIS — N3281 Overactive bladder: Secondary | ICD-10-CM | POA: Diagnosis not present

## 2019-11-01 DIAGNOSIS — M545 Low back pain: Secondary | ICD-10-CM | POA: Diagnosis not present

## 2019-11-01 DIAGNOSIS — S92911D Unspecified fracture of right toe(s), subsequent encounter for fracture with routine healing: Secondary | ICD-10-CM | POA: Diagnosis not present

## 2019-11-01 DIAGNOSIS — I129 Hypertensive chronic kidney disease with stage 1 through stage 4 chronic kidney disease, or unspecified chronic kidney disease: Secondary | ICD-10-CM | POA: Diagnosis not present

## 2019-11-01 DIAGNOSIS — R001 Bradycardia, unspecified: Secondary | ICD-10-CM | POA: Diagnosis not present

## 2019-11-01 DIAGNOSIS — E1122 Type 2 diabetes mellitus with diabetic chronic kidney disease: Secondary | ICD-10-CM | POA: Diagnosis not present

## 2019-11-01 DIAGNOSIS — N189 Chronic kidney disease, unspecified: Secondary | ICD-10-CM | POA: Diagnosis not present

## 2019-11-01 DIAGNOSIS — Z7951 Long term (current) use of inhaled steroids: Secondary | ICD-10-CM | POA: Diagnosis not present

## 2019-11-01 DIAGNOSIS — M1990 Unspecified osteoarthritis, unspecified site: Secondary | ICD-10-CM | POA: Diagnosis not present

## 2019-11-01 DIAGNOSIS — R252 Cramp and spasm: Secondary | ICD-10-CM | POA: Diagnosis not present

## 2019-11-01 DIAGNOSIS — I951 Orthostatic hypotension: Secondary | ICD-10-CM | POA: Diagnosis not present

## 2019-11-01 DIAGNOSIS — E669 Obesity, unspecified: Secondary | ICD-10-CM | POA: Diagnosis not present

## 2019-11-01 DIAGNOSIS — R413 Other amnesia: Secondary | ICD-10-CM | POA: Diagnosis not present

## 2019-11-01 DIAGNOSIS — E1140 Type 2 diabetes mellitus with diabetic neuropathy, unspecified: Secondary | ICD-10-CM | POA: Diagnosis not present

## 2019-11-01 DIAGNOSIS — Z7982 Long term (current) use of aspirin: Secondary | ICD-10-CM | POA: Diagnosis not present

## 2019-11-01 DIAGNOSIS — J449 Chronic obstructive pulmonary disease, unspecified: Secondary | ICD-10-CM | POA: Diagnosis not present

## 2019-11-01 DIAGNOSIS — D631 Anemia in chronic kidney disease: Secondary | ICD-10-CM | POA: Diagnosis not present

## 2019-11-01 DIAGNOSIS — R0989 Other specified symptoms and signs involving the circulatory and respiratory systems: Secondary | ICD-10-CM | POA: Diagnosis not present

## 2019-11-01 DIAGNOSIS — F418 Other specified anxiety disorders: Secondary | ICD-10-CM | POA: Diagnosis not present

## 2019-11-01 DIAGNOSIS — E785 Hyperlipidemia, unspecified: Secondary | ICD-10-CM | POA: Diagnosis not present

## 2019-11-01 DIAGNOSIS — E538 Deficiency of other specified B group vitamins: Secondary | ICD-10-CM | POA: Diagnosis not present

## 2019-11-01 DIAGNOSIS — I2510 Atherosclerotic heart disease of native coronary artery without angina pectoris: Secondary | ICD-10-CM | POA: Diagnosis not present

## 2019-11-01 DIAGNOSIS — J309 Allergic rhinitis, unspecified: Secondary | ICD-10-CM | POA: Diagnosis not present

## 2019-11-01 DIAGNOSIS — F39 Unspecified mood [affective] disorder: Secondary | ICD-10-CM | POA: Diagnosis not present

## 2019-11-01 DIAGNOSIS — E041 Nontoxic single thyroid nodule: Secondary | ICD-10-CM | POA: Diagnosis not present

## 2019-11-09 DIAGNOSIS — I129 Hypertensive chronic kidney disease with stage 1 through stage 4 chronic kidney disease, or unspecified chronic kidney disease: Secondary | ICD-10-CM | POA: Diagnosis not present

## 2019-11-09 DIAGNOSIS — N179 Acute kidney failure, unspecified: Secondary | ICD-10-CM | POA: Diagnosis not present

## 2019-11-09 DIAGNOSIS — E1122 Type 2 diabetes mellitus with diabetic chronic kidney disease: Secondary | ICD-10-CM | POA: Diagnosis not present

## 2019-11-09 DIAGNOSIS — I2510 Atherosclerotic heart disease of native coronary artery without angina pectoris: Secondary | ICD-10-CM | POA: Diagnosis not present

## 2019-11-09 DIAGNOSIS — I5032 Chronic diastolic (congestive) heart failure: Secondary | ICD-10-CM | POA: Diagnosis not present

## 2019-11-09 DIAGNOSIS — R809 Proteinuria, unspecified: Secondary | ICD-10-CM | POA: Diagnosis not present

## 2019-11-09 DIAGNOSIS — J449 Chronic obstructive pulmonary disease, unspecified: Secondary | ICD-10-CM | POA: Diagnosis not present

## 2019-11-09 DIAGNOSIS — N1832 Chronic kidney disease, stage 3b: Secondary | ICD-10-CM | POA: Diagnosis not present

## 2019-11-15 DIAGNOSIS — E119 Type 2 diabetes mellitus without complications: Secondary | ICD-10-CM | POA: Diagnosis not present

## 2019-11-15 DIAGNOSIS — N189 Chronic kidney disease, unspecified: Secondary | ICD-10-CM | POA: Diagnosis not present

## 2019-11-15 DIAGNOSIS — R296 Repeated falls: Secondary | ICD-10-CM | POA: Diagnosis not present

## 2019-11-15 DIAGNOSIS — I10 Essential (primary) hypertension: Secondary | ICD-10-CM | POA: Diagnosis not present

## 2019-11-15 DIAGNOSIS — E559 Vitamin D deficiency, unspecified: Secondary | ICD-10-CM | POA: Diagnosis not present

## 2019-11-15 DIAGNOSIS — M1990 Unspecified osteoarthritis, unspecified site: Secondary | ICD-10-CM | POA: Diagnosis not present

## 2019-11-21 ENCOUNTER — Other Ambulatory Visit: Payer: Self-pay | Admitting: Nephrology

## 2019-11-21 DIAGNOSIS — N1832 Chronic kidney disease, stage 3b: Secondary | ICD-10-CM

## 2019-11-21 DIAGNOSIS — R809 Proteinuria, unspecified: Secondary | ICD-10-CM

## 2019-11-26 ENCOUNTER — Ambulatory Visit
Admission: RE | Admit: 2019-11-26 | Discharge: 2019-11-26 | Disposition: A | Discharge status: Home / Self Care | Payer: PPO | Source: Ambulatory Visit | Attending: Nephrology | Admitting: Nephrology

## 2019-11-26 DIAGNOSIS — N189 Chronic kidney disease, unspecified: Secondary | ICD-10-CM | POA: Diagnosis not present

## 2019-11-26 DIAGNOSIS — N1832 Chronic kidney disease, stage 3b: Secondary | ICD-10-CM

## 2019-11-26 DIAGNOSIS — N281 Cyst of kidney, acquired: Secondary | ICD-10-CM | POA: Diagnosis not present

## 2019-11-26 DIAGNOSIS — R809 Proteinuria, unspecified: Secondary | ICD-10-CM

## 2019-12-03 ENCOUNTER — Other Ambulatory Visit: Payer: Self-pay

## 2019-12-03 ENCOUNTER — Encounter: Payer: Self-pay | Admitting: Neurology

## 2019-12-03 ENCOUNTER — Ambulatory Visit: Payer: PPO | Admitting: Neurology

## 2019-12-03 VITALS — BP 143/53 | HR 62 | Temp 97.7°F | Ht 63.0 in | Wt 195.5 lb

## 2019-12-03 DIAGNOSIS — R269 Unspecified abnormalities of gait and mobility: Secondary | ICD-10-CM

## 2019-12-03 DIAGNOSIS — E1142 Type 2 diabetes mellitus with diabetic polyneuropathy: Secondary | ICD-10-CM | POA: Diagnosis not present

## 2019-12-03 DIAGNOSIS — D329 Benign neoplasm of meninges, unspecified: Secondary | ICD-10-CM

## 2019-12-03 DIAGNOSIS — R200 Anesthesia of skin: Secondary | ICD-10-CM

## 2019-12-03 DIAGNOSIS — R413 Other amnesia: Secondary | ICD-10-CM

## 2019-12-03 NOTE — Progress Notes (Signed)
[facility]  PATIENT: Cheryl Summers DOB: June 13, 1941  REFERRING DOCTOR OR PCP:  Nicholos Johns, MD SOURCE: Patient, notes from primary care, imaging report, CT scan personally reviewed.  _________________________________   HISTORICAL  CHIEF COMPLAINT:  Chief Complaint  Patient presents with  . New Patient (Initial Visit)    RM 79 with daughter (temp: 97.7). paper referral from Nicholos Johns for abnormal CT head. Reports memory issues, balance issues. Ambulates with walker.     HISTORY OF PRESENT ILLNESS:  I had the pleasure seeing your patient, Cheryl Summers, at [facility] for neurologic consultation regarding her abnormal CT scan, memory loss and frequent falls.  She is an 79 year old woman who was having more trouble with memory loss and falls.   Due to these issues, she had a CT scan performed.  I personally reviewed the CT scan images from 09/25/2019.  It showed mild generalized atrophy, chronic microvascular ischemic changes and a 1.8 cm calcified mass consistent with meningioma.  There did not appear to be any significant mass-effect on the adjacent brain from the meningioma.  Gait issues have slowly developed over the last decade.  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  She had lumbar fusion in 2019 at L4-L5.  She has been using a walker since before the surgery.   She feels off balanced and her left leg will sometimes give out.   She has used a shower chair the last year.    She has had difficulty with memory loss over the past 6-7 years.   However, since she had back surgery (daughter reports she was under anaesthesia for 6 hours), she and daughter feels he memory has been worse.     She is forgetful and misplaces items.     She has forgotten to pay bills.   She no longer drives.   Her daughter manages finances the past 2 years.     MMSE - Mini Mental State Exam 12/03/2019  Orientation to time 0  Orientation to  Place 5  Registration 3  Attention/ Calculation 0  Recall 0  Language- name 2 objects 2  Language- repeat 1  Language- follow 3 step command 3  Language- read & follow direction 1  Write a sentence 1  Copy design 0  Total score 16     REVIEW OF SYSTEMS: Constitutional: No fevers, chills, sweats, or change in appetite Eyes: No visual changes, double vision, eye pain Ear, nose and throat: No hearing loss, ear pain, nasal congestion, sore throat Cardiovascular: No chest pain, palpitations Respiratory: No shortness of breath at rest or with exertion.   No wheezes GastrointestinaI: No nausea, vomiting, diarrhea, abdominal pain, fecal incontinence Genitourinary: No dysuria, urinary retention or frequency.  No nocturia. Musculoskeletal: No neck pain, back pain Integumentary: No rash, pruritus, skin lesions Neurological: as above Psychiatric: No depression at this time.  No anxiety Endocrine: No palpitations, diaphoresis, change in appetite, change in weigh or increased thirst Hematologic/Lymphatic: No anemia, purpura, petechiae. Allergic/Immunologic: No itchy/runny eyes, nasal congestion, recent allergic reactions, rashes  ALLERGIES: No Known Allergies  HOME MEDICATIONS:  Current Outpatient Medications:  .  albuterol (PROVENTIL HFA;VENTOLIN HFA) 108 (90 Base) MCG/ACT inhaler, Inhale 2 puffs into the lungs every 6 (six) hours as needed for wheezing or shortness of breath., Disp: , Rfl:  .  albuterol (PROVENTIL) (2.5 MG/3ML) 0.083% nebulizer solution, Take 2.5 mg by nebulization daily as needed for wheezing or shortness of breath., Disp: ,  Rfl:  .  allopurinol (ZYLOPRIM) 100 MG tablet, Take 100 mg by mouth daily as needed., Disp: , Rfl:  .  amLODipine (NORVASC) 10 MG tablet, Take 1 tablet (10 mg total) by mouth every other day. (Patient taking differently: Take 5 mg by mouth daily. ), Disp: , Rfl:  .  Artificial Tear Solution (SOOTHE XP) SOLN, Place 1 drop into both eyes daily as  needed (dry eyes)., Disp: , Rfl:  .  aspirin EC 81 MG tablet, Take 81 mg by mouth daily., Disp: , Rfl:  .  atorvastatin (LIPITOR) 20 MG tablet, Take 20 mg by mouth at bedtime. , Disp: , Rfl:  .  budesonide-formoterol (SYMBICORT) 160-4.5 MCG/ACT inhaler, Inhale 2 puffs into the lungs 2 (two) times daily as needed (shortness of breath). , Disp: , Rfl:  .  buPROPion (WELLBUTRIN XL) 150 MG 24 hr tablet, Take 150 mg by mouth at bedtime. , Disp: , Rfl:  .  cloNIDine (CATAPRES) 0.1 MG tablet, Take 1 tablet (0.1 mg total) by mouth daily. (Patient taking differently: Take 0.1 mg by mouth daily. Take 2 daily), Disp: 30 tablet, Rfl: 1 .  dapagliflozin propanediol (FARXIGA) 5 MG TABS tablet, Take 5 mg by mouth daily. , Disp: , Rfl:  .  donepezil (ARICEPT) 10 MG tablet, Take 10 mg by mouth at bedtime., Disp: , Rfl:  .  doxycycline (VIBRAMYCIN) 100 MG capsule, Take 100 mg by mouth 2 (two) times daily., Disp: , Rfl:  .  ezetimibe (ZETIA) 10 MG tablet, Take 5 mg by mouth at bedtime. , Disp: , Rfl:  .  fluticasone (FLONASE) 50 MCG/ACT nasal spray, as needed., Disp: , Rfl:  .  memantine (NAMENDA XR) 28 MG CP24 24 hr capsule, Take 1 capsule by mouth daily., Disp: , Rfl:  .  montelukast (SINGULAIR) 10 MG tablet, Take 10 mg by mouth at bedtime. , Disp: , Rfl:  .  Multiple Vitamin (MULTIVITAMIN) tablet, Take 1 tablet by mouth daily., Disp: , Rfl:  .  nystatin-triamcinolone (MYCOLOG II) cream, Apply 1 application topically daily as needed (skin bumps)., Disp: , Rfl:  .  omeprazole (PRILOSEC) 20 MG capsule, Take 20 mg by mouth daily as needed (acid reflux). , Disp: , Rfl:  .  polyethylene glycol (MIRALAX) packet, Take 17 g by mouth daily., Disp: 14 each, Rfl: 0 .  pregabalin (LYRICA) 75 MG capsule, Take 75 mg by mouth 2 (two) times daily., Disp: , Rfl:  .  quinapril (ACCUPRIL) 40 MG tablet, Take 40 mg by mouth daily., Disp: , Rfl:  .  sertraline (ZOLOFT) 50 MG tablet, Take 50 mg by mouth at bedtime. , Disp: , Rfl:  .   sodium chloride (OCEAN) 0.65 % SOLN nasal spray, Place 1 spray into both nostrils as needed for congestion., Disp: , Rfl:  .  telmisartan (MICARDIS) 80 MG tablet, Take 80 mg by mouth daily., Disp: , Rfl:  .  tiZANidine (ZANAFLEX) 2 MG tablet, Take 2 mg by mouth at bedtime. , Disp: , Rfl:  .  torsemide (DEMADEX) 20 MG tablet, Take 1 tablet on Monday, Wednesday, Friday only. (Patient taking differently: Take 20 mg by mouth daily. Take 1 tablet on Monday, Wednesday, Friday only.), Disp: 90 tablet, Rfl: 3  Current Facility-Administered Medications:  .  betamethasone acetate-betamethasone sodium phosphate (CELESTONE) injection 12 mg, 12 mg, Other, Once, Magnus Sinning, MD  PAST MEDICAL HISTORY: Past Medical History:  Diagnosis Date  . Anxiety   . Carotid artery stenosis, asymptomatic    50%  ASYMPTOMATIC  RIGHT CAROTID STENOSIS  . Chronic diastolic heart failure ([place]) 06/09/2015  . COPD (chronic obstructive pulmonary disease) ([place])   . Depression   . Diabetes mellitus without complication ([place])   . Dysrhythmia   . Gastroesophageal reflux   . Hyperlipidemia   . Hypertension   . Hypertensive heart disease with heart failure ([place]) 06/09/2015  . PAF (paroxysmal atrial fibrillation) ([place]) 06/09/2015   Overview:  CHADS2 vasc score=6  . Renal insufficiency   . Tobacco abuse   . Tremors of nervous system     PAST SURGICAL HISTORY: Past Surgical History:  Procedure Laterality Date  . ABDOMINAL HYSTERECTOMY    . CATARACT EXTRACTION  2016  . CORONARY ARTERY BYPASS GRAFT  2012  . NOSE SURGERY      FAMILY HISTORY: Family History  Problem Relation Age of Onset  . Heart attack Mother   . Hypertension Mother   . Hyperlipidemia Mother   . Heart attack Father   . Hypertension Father   . Hyperlipidemia Father   . Heart attack Sister   . Stroke Brother   . Heart attack Brother   . Hypertension Daughter     SOCIAL HISTORY:  Social History   Socioeconomic History  . Marital status:  Widowed    Spouse name: Not on file  . Number of children: 4  . Years of education: 23  . Highest education level: Not on file  Occupational History  . Not on file  Tobacco Use  . Smoking status: Former Research scientist (life sciences)  . Smokeless tobacco: Never Used  Substance and Sexual Activity  . Alcohol use: No  . Drug use: No  . Sexual activity: Not on file  Other Topics Concern  . Not on file  Social History Narrative   Right handed    Lives with grandson and twins   Caffeine use: daily coffee/tea.   Social Determinants of Health   Financial Resource Strain:   . Difficulty of Paying Living Expenses: Not on file  Food Insecurity:   . Worried About Charity fundraiser in the Last Year: Not on file  . Ran Out of Food in the Last Year: Not on file  Transportation Needs:   . Lack of Transportation (Medical): Not on file  . Lack of Transportation (Non-Medical): Not on file  Physical Activity:   . Days of Exercise per Week: Not on file  . Minutes of Exercise per Session: Not on file  Stress:   . Feeling of Stress : Not on file  Social Connections:   . Frequency of Communication with Friends and Family: Not on file  . Frequency of Social Gatherings with Friends and Family: Not on file  . Attends Religious Services: Not on file  . Active Member of Clubs or Organizations: Not on file  . Attends Archivist Meetings: Not on file  . Marital Status: Not on file  Intimate Partner Violence:   . Fear of Current or Ex-Partner: Not on file  . Emotionally Abused: Not on file  . Physically Abused: Not on file  . Sexually Abused: Not on file     PHYSICAL EXAM  Vitals:   12/03/19 1015  BP: (!) 143/53  Pulse: 62  Temp: 97.7 F (36.5 C)  Weight: 195 lb 8 oz (88.7 kg)  Height: '5\' 3"'  (1.6 m)    Body mass index is 34.63 kg/m.   General: The patient is well-developed and well-nourished and in no acute distress  HEENT:  Head  is [place]/AT.  Sclera are anicteric.    Neck: No carotid bruits  are noted.  The neck is nontender.  Cardiovascular: The heart has a regular rate and rhythm with a normal S1 and S2. There were no murmurs, gallops or rubs.    Skin: Extremities are without rash or edema.   Neurologic Exam  Mental status: The patient is alert and oriented x 2 (20??, no date) at the time of the examination.  She has reduced short-term memory and attention span.Marland Kitchen   Speech is normal.   She scored 16/30 on the MMSE today.    Cranial nerves: Extraocular movements are full. Facial symmetry is present. There is good facial sensation to soft touch bilaterally.Facial strength is normal.  Trapezius and sternocleidomastoid strength is normal. No dysarthria is noted.  The tongue is midline, and the patient has symmetric elevation of the soft palate. No obvious hearing deficits are noted.  Motor:  Muscle bulk is normal.   Tone is normal. Strength is  5 / 5 in all 4 extremities.   Sensory: Sensory testing is intact to pinprick, soft touch and vibration sensation in all 4 extremities.  Coordination: Cerebellar testing reveals good finger-nose-finger and heel-to-shin bilaterally.  Gait and station: Station is normal.   Gait is arthritic but she is able to walk without a walker.  She is a little off balance when she turns.  She has a positive Romberg sign.   Reflexes: Deep tendon reflexes are symmetric and normal in the arms and absent in the legs.   Plantar responses are flexor.    DIAGNOSTIC DATA (LABS, IMAGING, TESTING) - I reviewed patient records, labs, notes, testing and imaging myself where available.  Lab Results  Component Value Date   WBC 9.0 06/10/2018   HGB 8.2 (L) 06/10/2018   HCT 26.5 (L) 06/10/2018   MCV 97.1 06/10/2018   PLT 155 06/10/2018      Component Value Date/Time   NA 143 06/10/2018 0336   K 4.0 06/10/2018 0336   CL 110 06/10/2018 0336   CO2 23 06/10/2018 0336   GLUCOSE 109 (H) 06/10/2018 0336   BUN 23 06/10/2018 0336   CREATININE 1.35 (H) 06/10/2018  0336   CALCIUM 8.4 (L) 06/10/2018 0336   PROT 6.8 06/01/2018 0858   ALBUMIN 3.9 06/01/2018 0858   AST 26 06/01/2018 0858   ALT 25 06/01/2018 0858   ALKPHOS 101 06/01/2018 0858   BILITOT 0.9 06/01/2018 0858   GFRNONAA 36 (L) 06/10/2018 0336   GFRAA 42 (L) 06/10/2018 0336   No results found for: CHOL, HDL, LDLCALC, LDLDIRECT, TRIG, CHOLHDL Lab Results  Component Value Date   HGBA1C 5.4 06/06/2018   Lab Results  Component Value Date   VITAMINB12 186 06/08/2018        ASSESSMENT AND PLAN  Meningioma (HCC)  Numbness - Plan: Vitamin B12, Multiple Myeloma Panel (SPEP&IFE w/QIG), Sjogren's syndrome antibods(ssa + ssb)  Memory loss - Plan: Vitamin B12, Multiple Myeloma Panel (SPEP&IFE w/QIG), Sjogren's syndrome antibods(ssa + ssb)  Gait disturbance  Diabetic polyneuropathy associated with type 2 diabetes mellitus ([place])   In summary, Ms. Caba is an 79 year old woman with a 1.8 cm meningioma, memory loss and gait disturbance.  The meningioma is likely asymptomatic.  It is heavily calcified and probably has been present for many years.  We will need to do a follow-up MRI later this year to determine if it is changing in size and to better characterize the atrophy and ischemic changes.  I  discussed with her and her daughter that her memory is likely multifactorial.  I am concerned that she could have Alzheimer's as short-term memory was greatly affected.  She also has evidence of significant chronic microvascular ischemic change on the MRI a vascular overlap with the Alzheimer's is the most likely.  She should continue the Aricept and Namenda.  Issues with her walking are also likely multifactorial.  She has significant neuropathy with almost absent vibration sensation at the ankles.  Additionally, the chronic ischemic changes in the brain unlikely to affect her gait some and she also has degenerative changes in the spine.  Unfortunate, there is no treatment for this and she should try  to stay active in a safe manner physical therapy can always be reconsidered.  The numbness in the ankles is most likely due to diabetic polyneuropathy.  I will check some blood work to rule out MGUS, B12 deficiency and Sjogren's disease.  I have advised her not to drive.  Additionally the daughter wished to discuss her ability to be on her own.  I do not think she can live independently.  She could remain in her home if family or assistants/aides are able to be with her for a majority of the time.  Otherwise she might benefit from assisted living.  She will return to see [place] in 6 months.  I will schedule follow-up MRI at that time.  Thank you for asking me to see [name].  Please let me know if I can be of further assistance with her or other patients in the future.   Cheryl Summers A. Felecia Shelling, MD, [facility] 01/25/3535, 14:43 AM Certified in Neurology, Clinical Neurophysiology, Sleep Medicine and Neuroimaging  [facility] [address] [place],  [address] [phone]

## 2019-12-05 ENCOUNTER — Telehealth: Payer: Self-pay | Admitting: Neurology

## 2019-12-05 DIAGNOSIS — N1832 Chronic kidney disease, stage 3b: Secondary | ICD-10-CM | POA: Diagnosis not present

## 2019-12-05 NOTE — Telephone Encounter (Signed)
Ratcliffe,[name](daughter on DPR) has called to see if there is a spectrum pt falls on from the testing Dr Felecia Shelling ran on yesterday

## 2019-12-06 LAB — MULTIPLE MYELOMA PANEL, SERUM
Albumin SerPl Elph-Mcnc: 3.9 g/dL (ref 2.9–4.4)
Albumin/Glob SerPl: 1.4 (ref 0.7–1.7)
Alpha 1: 0.2 g/dL (ref 0.0–0.4)
Alpha2 Glob SerPl Elph-Mcnc: 0.9 g/dL (ref 0.4–1.0)
B-Globulin SerPl Elph-Mcnc: 1.1 g/dL (ref 0.7–1.3)
Gamma Glob SerPl Elph-Mcnc: 0.6 g/dL (ref 0.4–1.8)
Globulin, Total: 2.8 g/dL (ref 2.2–3.9)
IgA/Immunoglobulin A, Serum: 268 mg/dL (ref 64–422)
IgG (Immunoglobin G), Serum: 707 mg/dL (ref 586–1602)
IgM (Immunoglobulin M), Srm: 40 mg/dL (ref 26–217)
Total Protein: 6.7 g/dL (ref 6.0–8.5)

## 2019-12-06 LAB — VITAMIN B12: Vitamin B-12: 1151 pg/mL (ref 232–1245)

## 2019-12-06 LAB — SJOGREN'S SYNDROME ANTIBODS(SSA + SSB)
ENA SSA (RO) Ab: <0.2 AI (ref 0.0–0.9)
ENA SSB (LA) Ab: <0.2 AI (ref 0.0–0.9)

## 2019-12-07 NOTE — Telephone Encounter (Signed)
Please let her know that she tested as moderate dementia on the test [facility] cognitive assessment - [facility]).    Labwork was fine.

## 2019-12-10 NOTE — Telephone Encounter (Signed)
Called, LVM for daughter relaying Dr. Garth Bigness message. Asked her to call back if she has any further questions/concerns.

## 2019-12-17 DIAGNOSIS — E1140 Type 2 diabetes mellitus with diabetic neuropathy, unspecified: Secondary | ICD-10-CM | POA: Diagnosis not present

## 2019-12-17 DIAGNOSIS — R413 Other amnesia: Secondary | ICD-10-CM | POA: Diagnosis not present

## 2019-12-17 DIAGNOSIS — M545 Low back pain: Secondary | ICD-10-CM | POA: Diagnosis not present

## 2019-12-17 DIAGNOSIS — Z86018 Personal history of other benign neoplasm: Secondary | ICD-10-CM | POA: Diagnosis not present

## 2019-12-17 DIAGNOSIS — R270 Ataxia, unspecified: Secondary | ICD-10-CM | POA: Diagnosis not present

## 2019-12-17 DIAGNOSIS — K219 Gastro-esophageal reflux disease without esophagitis: Secondary | ICD-10-CM | POA: Diagnosis not present

## 2019-12-17 DIAGNOSIS — Z742 Need for assistance at home and no other household member able to render care: Secondary | ICD-10-CM | POA: Diagnosis not present

## 2019-12-19 DIAGNOSIS — D631 Anemia in chronic kidney disease: Secondary | ICD-10-CM | POA: Diagnosis not present

## 2019-12-19 DIAGNOSIS — J309 Allergic rhinitis, unspecified: Secondary | ICD-10-CM | POA: Diagnosis not present

## 2019-12-19 DIAGNOSIS — E782 Mixed hyperlipidemia: Secondary | ICD-10-CM | POA: Diagnosis not present

## 2019-12-19 DIAGNOSIS — M545 Low back pain: Secondary | ICD-10-CM | POA: Diagnosis not present

## 2019-12-19 DIAGNOSIS — Z7982 Long term (current) use of aspirin: Secondary | ICD-10-CM | POA: Diagnosis not present

## 2019-12-19 DIAGNOSIS — E538 Deficiency of other specified B group vitamins: Secondary | ICD-10-CM | POA: Diagnosis not present

## 2019-12-19 DIAGNOSIS — I951 Orthostatic hypotension: Secondary | ICD-10-CM | POA: Diagnosis not present

## 2019-12-19 DIAGNOSIS — K5909 Other constipation: Secondary | ICD-10-CM | POA: Diagnosis not present

## 2019-12-19 DIAGNOSIS — I129 Hypertensive chronic kidney disease with stage 1 through stage 4 chronic kidney disease, or unspecified chronic kidney disease: Secondary | ICD-10-CM | POA: Diagnosis not present

## 2019-12-19 DIAGNOSIS — E785 Hyperlipidemia, unspecified: Secondary | ICD-10-CM | POA: Diagnosis not present

## 2019-12-19 DIAGNOSIS — F0390 Unspecified dementia without behavioral disturbance: Secondary | ICD-10-CM | POA: Diagnosis not present

## 2019-12-19 DIAGNOSIS — E559 Vitamin D deficiency, unspecified: Secondary | ICD-10-CM | POA: Diagnosis not present

## 2019-12-19 DIAGNOSIS — I2510 Atherosclerotic heart disease of native coronary artery without angina pectoris: Secondary | ICD-10-CM | POA: Diagnosis not present

## 2019-12-19 DIAGNOSIS — M109 Gout, unspecified: Secondary | ICD-10-CM | POA: Diagnosis not present

## 2019-12-19 DIAGNOSIS — F39 Unspecified mood [affective] disorder: Secondary | ICD-10-CM | POA: Diagnosis not present

## 2019-12-19 DIAGNOSIS — M5137 Other intervertebral disc degeneration, lumbosacral region: Secondary | ICD-10-CM | POA: Diagnosis not present

## 2019-12-19 DIAGNOSIS — J449 Chronic obstructive pulmonary disease, unspecified: Secondary | ICD-10-CM | POA: Diagnosis not present

## 2019-12-19 DIAGNOSIS — E1122 Type 2 diabetes mellitus with diabetic chronic kidney disease: Secondary | ICD-10-CM | POA: Diagnosis not present

## 2019-12-19 DIAGNOSIS — M1990 Unspecified osteoarthritis, unspecified site: Secondary | ICD-10-CM | POA: Diagnosis not present

## 2019-12-19 DIAGNOSIS — E1140 Type 2 diabetes mellitus with diabetic neuropathy, unspecified: Secondary | ICD-10-CM | POA: Diagnosis not present

## 2019-12-19 DIAGNOSIS — E041 Nontoxic single thyroid nodule: Secondary | ICD-10-CM | POA: Diagnosis not present

## 2019-12-19 DIAGNOSIS — E669 Obesity, unspecified: Secondary | ICD-10-CM | POA: Diagnosis not present

## 2019-12-19 DIAGNOSIS — R413 Other amnesia: Secondary | ICD-10-CM | POA: Diagnosis not present

## 2019-12-19 DIAGNOSIS — E1151 Type 2 diabetes mellitus with diabetic peripheral angiopathy without gangrene: Secondary | ICD-10-CM | POA: Diagnosis not present

## 2019-12-19 DIAGNOSIS — N3281 Overactive bladder: Secondary | ICD-10-CM | POA: Diagnosis not present

## 2019-12-19 DIAGNOSIS — F418 Other specified anxiety disorders: Secondary | ICD-10-CM | POA: Diagnosis not present

## 2019-12-19 DIAGNOSIS — R252 Cramp and spasm: Secondary | ICD-10-CM | POA: Diagnosis not present

## 2019-12-19 DIAGNOSIS — N189 Chronic kidney disease, unspecified: Secondary | ICD-10-CM | POA: Diagnosis not present

## 2019-12-19 DIAGNOSIS — S92911D Unspecified fracture of right toe(s), subsequent encounter for fracture with routine healing: Secondary | ICD-10-CM | POA: Diagnosis not present

## 2019-12-19 DIAGNOSIS — R001 Bradycardia, unspecified: Secondary | ICD-10-CM | POA: Diagnosis not present

## 2019-12-19 DIAGNOSIS — R0989 Other specified symptoms and signs involving the circulatory and respiratory systems: Secondary | ICD-10-CM | POA: Diagnosis not present

## 2020-01-17 DIAGNOSIS — Z79899 Other long term (current) drug therapy: Secondary | ICD-10-CM | POA: Diagnosis not present

## 2020-01-17 DIAGNOSIS — M1990 Unspecified osteoarthritis, unspecified site: Secondary | ICD-10-CM | POA: Diagnosis not present

## 2020-01-17 DIAGNOSIS — E1140 Type 2 diabetes mellitus with diabetic neuropathy, unspecified: Secondary | ICD-10-CM | POA: Diagnosis not present

## 2020-01-17 DIAGNOSIS — F418 Other specified anxiety disorders: Secondary | ICD-10-CM | POA: Diagnosis not present

## 2020-01-17 DIAGNOSIS — E785 Hyperlipidemia, unspecified: Secondary | ICD-10-CM | POA: Diagnosis not present

## 2020-01-17 DIAGNOSIS — I10 Essential (primary) hypertension: Secondary | ICD-10-CM | POA: Diagnosis not present

## 2020-01-18 DIAGNOSIS — E669 Obesity, unspecified: Secondary | ICD-10-CM | POA: Diagnosis not present

## 2020-01-18 DIAGNOSIS — J309 Allergic rhinitis, unspecified: Secondary | ICD-10-CM | POA: Diagnosis not present

## 2020-01-18 DIAGNOSIS — F418 Other specified anxiety disorders: Secondary | ICD-10-CM | POA: Diagnosis not present

## 2020-01-18 DIAGNOSIS — N3281 Overactive bladder: Secondary | ICD-10-CM | POA: Diagnosis not present

## 2020-01-18 DIAGNOSIS — E538 Deficiency of other specified B group vitamins: Secondary | ICD-10-CM | POA: Diagnosis not present

## 2020-01-18 DIAGNOSIS — M5137 Other intervertebral disc degeneration, lumbosacral region: Secondary | ICD-10-CM | POA: Diagnosis not present

## 2020-01-18 DIAGNOSIS — I951 Orthostatic hypotension: Secondary | ICD-10-CM | POA: Diagnosis not present

## 2020-01-18 DIAGNOSIS — R001 Bradycardia, unspecified: Secondary | ICD-10-CM | POA: Diagnosis not present

## 2020-01-18 DIAGNOSIS — I2510 Atherosclerotic heart disease of native coronary artery without angina pectoris: Secondary | ICD-10-CM | POA: Diagnosis not present

## 2020-01-18 DIAGNOSIS — K5909 Other constipation: Secondary | ICD-10-CM | POA: Diagnosis not present

## 2020-01-18 DIAGNOSIS — F0390 Unspecified dementia without behavioral disturbance: Secondary | ICD-10-CM | POA: Diagnosis not present

## 2020-01-18 DIAGNOSIS — D631 Anemia in chronic kidney disease: Secondary | ICD-10-CM | POA: Diagnosis not present

## 2020-01-18 DIAGNOSIS — E559 Vitamin D deficiency, unspecified: Secondary | ICD-10-CM | POA: Diagnosis not present

## 2020-01-18 DIAGNOSIS — E1122 Type 2 diabetes mellitus with diabetic chronic kidney disease: Secondary | ICD-10-CM | POA: Diagnosis not present

## 2020-01-18 DIAGNOSIS — E1151 Type 2 diabetes mellitus with diabetic peripheral angiopathy without gangrene: Secondary | ICD-10-CM | POA: Diagnosis not present

## 2020-01-18 DIAGNOSIS — N189 Chronic kidney disease, unspecified: Secondary | ICD-10-CM | POA: Diagnosis not present

## 2020-01-18 DIAGNOSIS — M1990 Unspecified osteoarthritis, unspecified site: Secondary | ICD-10-CM | POA: Diagnosis not present

## 2020-01-18 DIAGNOSIS — E041 Nontoxic single thyroid nodule: Secondary | ICD-10-CM | POA: Diagnosis not present

## 2020-01-18 DIAGNOSIS — S92911D Unspecified fracture of right toe(s), subsequent encounter for fracture with routine healing: Secondary | ICD-10-CM | POA: Diagnosis not present

## 2020-01-18 DIAGNOSIS — E1140 Type 2 diabetes mellitus with diabetic neuropathy, unspecified: Secondary | ICD-10-CM | POA: Diagnosis not present

## 2020-01-18 DIAGNOSIS — J449 Chronic obstructive pulmonary disease, unspecified: Secondary | ICD-10-CM | POA: Diagnosis not present

## 2020-01-18 DIAGNOSIS — E782 Mixed hyperlipidemia: Secondary | ICD-10-CM | POA: Diagnosis not present

## 2020-01-18 DIAGNOSIS — I129 Hypertensive chronic kidney disease with stage 1 through stage 4 chronic kidney disease, or unspecified chronic kidney disease: Secondary | ICD-10-CM | POA: Diagnosis not present

## 2020-01-18 DIAGNOSIS — F39 Unspecified mood [affective] disorder: Secondary | ICD-10-CM | POA: Diagnosis not present

## 2020-01-18 DIAGNOSIS — M109 Gout, unspecified: Secondary | ICD-10-CM | POA: Diagnosis not present

## 2020-01-29 DIAGNOSIS — E1142 Type 2 diabetes mellitus with diabetic polyneuropathy: Secondary | ICD-10-CM | POA: Diagnosis not present

## 2020-01-29 DIAGNOSIS — S9032XA Contusion of left foot, initial encounter: Secondary | ICD-10-CM | POA: Diagnosis not present

## 2020-01-29 DIAGNOSIS — L03031 Cellulitis of right toe: Secondary | ICD-10-CM | POA: Diagnosis not present

## 2020-01-29 DIAGNOSIS — L03032 Cellulitis of left toe: Secondary | ICD-10-CM | POA: Diagnosis not present

## 2020-01-30 DIAGNOSIS — E1140 Type 2 diabetes mellitus with diabetic neuropathy, unspecified: Secondary | ICD-10-CM | POA: Diagnosis not present

## 2020-01-30 DIAGNOSIS — E785 Hyperlipidemia, unspecified: Secondary | ICD-10-CM | POA: Diagnosis not present

## 2020-01-30 DIAGNOSIS — Z79899 Other long term (current) drug therapy: Secondary | ICD-10-CM | POA: Diagnosis not present

## 2020-02-21 DIAGNOSIS — Z6833 Body mass index (BMI) 33.0-33.9, adult: Secondary | ICD-10-CM | POA: Diagnosis not present

## 2020-02-21 DIAGNOSIS — Z139 Encounter for screening, unspecified: Secondary | ICD-10-CM | POA: Diagnosis not present

## 2020-02-21 DIAGNOSIS — R600 Localized edema: Secondary | ICD-10-CM | POA: Diagnosis not present

## 2020-02-21 DIAGNOSIS — E1140 Type 2 diabetes mellitus with diabetic neuropathy, unspecified: Secondary | ICD-10-CM | POA: Diagnosis not present

## 2020-02-21 DIAGNOSIS — I10 Essential (primary) hypertension: Secondary | ICD-10-CM | POA: Diagnosis not present

## 2020-04-01 DIAGNOSIS — Z6833 Body mass index (BMI) 33.0-33.9, adult: Secondary | ICD-10-CM | POA: Diagnosis not present

## 2020-04-01 DIAGNOSIS — Z0000 Encounter for general adult medical examination without abnormal findings: Secondary | ICD-10-CM | POA: Diagnosis not present

## 2020-04-01 DIAGNOSIS — Z9181 History of falling: Secondary | ICD-10-CM | POA: Diagnosis not present

## 2020-04-01 DIAGNOSIS — Z1331 Encounter for screening for depression: Secondary | ICD-10-CM | POA: Diagnosis not present

## 2020-04-01 DIAGNOSIS — E785 Hyperlipidemia, unspecified: Secondary | ICD-10-CM | POA: Diagnosis not present

## 2020-04-14 ENCOUNTER — Ambulatory Visit: Payer: PPO | Admitting: Cardiology

## 2020-04-14 DIAGNOSIS — M79675 Pain in left toe(s): Secondary | ICD-10-CM | POA: Diagnosis not present

## 2020-04-14 DIAGNOSIS — I2510 Atherosclerotic heart disease of native coronary artery without angina pectoris: Secondary | ICD-10-CM | POA: Diagnosis not present

## 2020-04-14 DIAGNOSIS — I10 Essential (primary) hypertension: Secondary | ICD-10-CM | POA: Diagnosis not present

## 2020-04-14 DIAGNOSIS — J449 Chronic obstructive pulmonary disease, unspecified: Secondary | ICD-10-CM | POA: Diagnosis not present

## 2020-04-14 DIAGNOSIS — E1121 Type 2 diabetes mellitus with diabetic nephropathy: Secondary | ICD-10-CM | POA: Diagnosis not present

## 2020-04-14 DIAGNOSIS — R270 Ataxia, unspecified: Secondary | ICD-10-CM | POA: Diagnosis not present

## 2020-04-14 DIAGNOSIS — R809 Proteinuria, unspecified: Secondary | ICD-10-CM | POA: Diagnosis not present

## 2020-04-14 DIAGNOSIS — R413 Other amnesia: Secondary | ICD-10-CM | POA: Diagnosis not present

## 2020-04-14 DIAGNOSIS — I5032 Chronic diastolic (congestive) heart failure: Secondary | ICD-10-CM | POA: Diagnosis not present

## 2020-04-14 DIAGNOSIS — E1140 Type 2 diabetes mellitus with diabetic neuropathy, unspecified: Secondary | ICD-10-CM | POA: Diagnosis not present

## 2020-04-14 DIAGNOSIS — M1990 Unspecified osteoarthritis, unspecified site: Secondary | ICD-10-CM | POA: Diagnosis not present

## 2020-04-14 DIAGNOSIS — N179 Acute kidney failure, unspecified: Secondary | ICD-10-CM | POA: Diagnosis not present

## 2020-04-14 DIAGNOSIS — E1122 Type 2 diabetes mellitus with diabetic chronic kidney disease: Secondary | ICD-10-CM | POA: Diagnosis not present

## 2020-04-14 DIAGNOSIS — N1832 Chronic kidney disease, stage 3b: Secondary | ICD-10-CM | POA: Diagnosis not present

## 2020-04-15 DIAGNOSIS — E1142 Type 2 diabetes mellitus with diabetic polyneuropathy: Secondary | ICD-10-CM | POA: Diagnosis not present

## 2020-04-15 DIAGNOSIS — S9032XA Contusion of left foot, initial encounter: Secondary | ICD-10-CM | POA: Diagnosis not present

## 2020-04-15 DIAGNOSIS — B351 Tinea unguium: Secondary | ICD-10-CM | POA: Diagnosis not present

## 2020-04-17 ENCOUNTER — Ambulatory Visit: Payer: PPO | Admitting: Cardiology

## 2020-05-21 DIAGNOSIS — E1140 Type 2 diabetes mellitus with diabetic neuropathy, unspecified: Secondary | ICD-10-CM | POA: Diagnosis not present

## 2020-05-21 DIAGNOSIS — E669 Obesity, unspecified: Secondary | ICD-10-CM | POA: Diagnosis not present

## 2020-05-21 DIAGNOSIS — R600 Localized edema: Secondary | ICD-10-CM | POA: Diagnosis not present

## 2020-05-21 DIAGNOSIS — J309 Allergic rhinitis, unspecified: Secondary | ICD-10-CM | POA: Diagnosis not present

## 2020-05-21 DIAGNOSIS — I10 Essential (primary) hypertension: Secondary | ICD-10-CM | POA: Diagnosis not present

## 2020-05-21 DIAGNOSIS — Z6833 Body mass index (BMI) 33.0-33.9, adult: Secondary | ICD-10-CM | POA: Diagnosis not present

## 2020-05-25 NOTE — Progress Notes (Signed)
Cardiology Office Note:    Date:  05/26/2020   ID:  Cheryl Summers, DOB 05/04/1941, MRN [id number]  PCP:  Nicholos Johns, MD  Cardiologist:  Shirlee More, MD    Referring MD: Nicholos Johns, MD    ASSESSMENT:    1. PAF (paroxysmal atrial fibrillation) ([place])   2. Hypertensive heart disease with heart failure ([place])   3. Coronary artery disease involving native coronary artery of native heart with angina pectoris ([place])   4. Stenosis of left subclavian artery ([place])   5. Dyslipidemia    PLAN:    In order of problems listed above:  1. Stable maintaining sinus rhythm not anticoagulated because of frequent falls. 2. In her case I think her blood pressure is at target she takes an array of antihypertensive agents as well as drugs affecting heart rhythm and diuretic and I think she is at great risk of hypotension bradycardia and injury with her gait disturbance and continue her current multidrug regimen including centrally active clonidine telmisartan and diuretic 3. Her CAD is stable having no angina current medical therapy continue same 4. Asymptomatic stable 5. Continue combined statin and Zetia recheck liver function lipid profile   Next appointment: 6 months   Medication Adjustments/Labs and Tests Ordered: Current medicines are reviewed at length with the patient today.  Concerns regarding medicines are outlined above.  No orders of the defined types were placed in this encounter.  No orders of the defined types were placed in this encounter.   Chief Complaint  Patient presents with  . Follow-up  . Atrial Fibrillation  . Hypertension  . Congestive Heart Failure  . Coronary Artery Disease  . Hyperlipidemia    History of Present Illness:    Cheryl Summers is a 79 y.o. female with a hx of CABG in 2012,, CHF, paroxysmal Atrial Fibrillation-CHADS2 vasc score=5, bradycardia, hypertension, and hyperlipidemia.She is not anticoagulated due to gait dysfunction.   She was seen  09/18/2019.  At that time she was seen after having multiple falls [place] ED visit in a discrepancy with a blood pressure difference left arm less than the right 50 mmHg.  CT of the head was performed showing a calcified meningioma in the posterior left parietal region 1.8 cm.  A carotid duplex showed 40 to 59% bilateral ICA stenosis left subclavian artery stenosis and bidirectional flow in the left vertebral artery, subclavian steal physiology.  Echocardiogram showed normal ejection fraction 60 to 65% both atria are normal in size no significant valvular abnormality.  She has left subclavian artery stenosis without steal or coronary ischemia.  She was last seen 10/09/2019.  Compliance with diet, lifestyle and medications: Yes  He was seen by neurology 12/03/2019 for meningioma.  She was advised to continue Aricept and Namenda.  She has felt to have multiple causes of her gait disturbance and falls including neuropathy and was advised not to drive a car.  She is seen with her daughter participates in evaluation decision making.  Overall she is doing better she is careful use a cane as had no falls.  She had a televisit with her PCP and recheck her labs today.  Most recently performed 01/30/2020 cholesterol 122 LDL 53 triglyceride 112 HDL 49 all at target, A1c 5.4% normal, creatinine significantly elevated 1.82.  Her daughter checks blood pressure and pulse at home they have been in range no indication of recurrent atrial fibrillation she has had no chest pain or shortness of breath.  She takes Lyrica and  has a small degree of chronic stable edema.  I told the daughter that if it becomes worsened give her an extra tablet of diuretic daily until improved.  She has had no focal neurologic symptoms and continues to follow with neurology.  She tolerates lipid-lowering therapy statin Zetia without muscle pain or weakness Past Medical History:  Diagnosis Date  . Anxiety   . Carotid artery stenosis,  asymptomatic    50% ASYMPTOMATIC  RIGHT CAROTID STENOSIS  . Chronic diastolic heart failure ([place]) 06/09/2015  . COPD (chronic obstructive pulmonary disease) ([place])   . Depression   . Diabetes mellitus without complication ([place])   . Dysrhythmia   . Gastroesophageal reflux   . Hyperlipidemia   . Hypertension   . Hypertensive heart disease with heart failure ([place]) 06/09/2015  . PAF (paroxysmal atrial fibrillation) ([place]) 06/09/2015   Overview:  CHADS2 vasc score=6  . Renal insufficiency   . Tobacco abuse   . Tremors of nervous system     Past Surgical History:  Procedure Laterality Date  . ABDOMINAL HYSTERECTOMY    . CATARACT EXTRACTION  2016  . CORONARY ARTERY BYPASS GRAFT  2012  . NOSE SURGERY      Current Medications: Current Meds  Medication Sig  . albuterol (PROVENTIL HFA;VENTOLIN HFA) 108 (90 Base) MCG/ACT inhaler Inhale 2 puffs into the lungs every 6 (six) hours as needed for wheezing or shortness of breath.  Marland Kitchen albuterol (PROVENTIL) (2.5 MG/3ML) 0.083% nebulizer solution Take 2.5 mg by nebulization daily as needed for wheezing or shortness of breath.  . allopurinol (ZYLOPRIM) 100 MG tablet Take 100 mg by mouth daily as needed.  Marland Kitchen amLODipine (NORVASC) 5 MG tablet Take 5 mg by mouth daily.  . Artificial Tear Solution (SOOTHE XP) SOLN Place 1 drop into both eyes daily as needed (dry eyes).  Marland Kitchen aspirin EC 81 MG tablet Take 81 mg by mouth daily.  Marland Kitchen atorvastatin (LIPITOR) 20 MG tablet Take 20 mg by mouth at bedtime.   . budesonide-formoterol (SYMBICORT) 160-4.5 MCG/ACT inhaler Inhale 2 puffs into the lungs 2 (two) times daily as needed (shortness of breath).   Marland Kitchen buPROPion (WELLBUTRIN XL) 150 MG 24 hr tablet Take 150 mg by mouth at bedtime.   . cloNIDine (CATAPRES) 0.2 MG tablet Take 0.2 mg by mouth in the morning and at bedtime.  . dapagliflozin propanediol (FARXIGA) 5 MG TABS tablet Take 5 mg by mouth daily.   Marland Kitchen donepezil (ARICEPT) 10 MG tablet Take 10 mg by mouth at bedtime.  Marland Kitchen  doxycycline (VIBRAMYCIN) 100 MG capsule Take 100 mg by mouth 2 (two) times daily.  Marland Kitchen ezetimibe (ZETIA) 10 MG tablet Take 5 mg by mouth at bedtime.   . fluticasone (FLONASE) 50 MCG/ACT nasal spray as needed.  . memantine (NAMENDA XR) 28 MG CP24 24 hr capsule Take 1 capsule by mouth daily.  . montelukast (SINGULAIR) 10 MG tablet Take 10 mg by mouth at bedtime.   . Multiple Vitamin (MULTIVITAMIN) tablet Take 1 tablet by mouth daily.  Marland Kitchen nystatin-triamcinolone (MYCOLOG II) cream Apply 1 application topically daily as needed (skin bumps).  Marland Kitchen omeprazole (PRILOSEC) 20 MG capsule Take 20 mg by mouth daily as needed (acid reflux).   . polyethylene glycol (MIRALAX) packet Take 17 g by mouth daily.  . pregabalin (LYRICA) 75 MG capsule Take 75 mg by mouth 2 (two) times daily.  . sertraline (ZOLOFT) 50 MG tablet Take 50 mg by mouth at bedtime.   . sodium chloride (OCEAN) 0.65 % SOLN  nasal spray Place 1 spray into both nostrils as needed for congestion.  Marland Kitchen telmisartan (MICARDIS) 80 MG tablet Take 80 mg by mouth daily.  Marland Kitchen tiZANidine (ZANAFLEX) 2 MG tablet Take 2 mg by mouth at bedtime.   . torsemide (DEMADEX) 20 MG tablet Take 20 mg by mouth daily.   Current Facility-Administered Medications for the 05/26/20 encounter (Office Visit) with Richardo Priest, MD  Medication  . betamethasone acetate-betamethasone sodium phosphate (CELESTONE) injection 12 mg     Allergies:   Patient has no known allergies.   Social History   Socioeconomic History  . Marital status: Widowed    Spouse name: Not on file  . Number of children: 4  . Years of education: 70  . Highest education level: Not on file  Occupational History  . Not on file  Tobacco Use  . Smoking status: Former Research scientist (life sciences)  . Smokeless tobacco: Never Used  Vaping Use  . Vaping Use: Never used  Substance and Sexual Activity  . Alcohol use: No  . Drug use: No  . Sexual activity: Not on file  Other Topics Concern  . Not on file  Social History  Narrative   Right handed    Lives with grandson and twins   Caffeine use: daily coffee/tea.   Social Determinants of Health   Financial Resource Strain:   . Difficulty of Paying Living Expenses:   Food Insecurity:   . Worried About Charity fundraiser in the Last Year:   . Arboriculturist in the Last Year:   Transportation Needs:   . Film/video editor (Medical):   Marland Kitchen Lack of Transportation (Non-Medical):   Physical Activity:   . Days of Exercise per Week:   . Minutes of Exercise per Session:   Stress:   . Feeling of Stress :   Social Connections:   . Frequency of Communication with Friends and Family:   . Frequency of Social Gatherings with Friends and Family:   . Attends Religious Services:   . Active Member of Clubs or Organizations:   . Attends Archivist Meetings:   Marland Kitchen Marital Status:      Family History: The patient's family history includes Heart attack in her brother, father, mother, and sister; Hyperlipidemia in her father and mother; Hypertension in her daughter, father, and mother; Stroke in her brother. ROS:   Please see the history of present illness.    All other systems reviewed and are negative.  EKGs/Labs/Other Studies Reviewed:    The following studies were reviewed today    Physical Exam:    VS:  BP (!) 158/60 (BP Location: Right Arm, Patient Position: Sitting, Cuff Size: Large)   Pulse 60   Ht 5\' 3"  (1.6 m)   Wt 195 lb (88.5 kg)   SpO2 95%   BMI 34.54 kg/m     Wt Readings from Last 3 Encounters:  05/26/20 195 lb (88.5 kg)  12/03/19 195 lb 8 oz (88.7 kg)  10/09/19 198 lb (89.8 kg)     GEN: He looks frail well nourished, well developed in no acute distress HEENT: Normal NECK: No JVD; No carotid bruits LYMPHATICS: No lymphadenopathy CARDIAC: RRR, no murmurs, rubs, gallops RESPIRATORY:  Clear to auscultation without rales, wheezing or rhonchi  ABDOMEN: Soft, non-tender, non-distended MUSCULOSKELETAL:  No edema; No deformity    SKIN: Warm and dry NEUROLOGIC:  Alert and oriented x 3 PSYCHIATRIC:  Normal affect    Signed, Shirlee More, MD  05/26/2020  3:24 PM    [facility]

## 2020-05-26 ENCOUNTER — Other Ambulatory Visit: Payer: Self-pay

## 2020-05-26 ENCOUNTER — Encounter: Payer: Self-pay | Admitting: Cardiology

## 2020-05-26 ENCOUNTER — Ambulatory Visit: Payer: PPO | Admitting: Cardiology

## 2020-05-26 VITALS — BP 158/60 | HR 60 | Ht 63.0 in | Wt 195.0 lb

## 2020-05-26 DIAGNOSIS — I771 Stricture of artery: Secondary | ICD-10-CM | POA: Diagnosis not present

## 2020-05-26 DIAGNOSIS — E785 Hyperlipidemia, unspecified: Secondary | ICD-10-CM | POA: Diagnosis not present

## 2020-05-26 DIAGNOSIS — I25119 Atherosclerotic heart disease of native coronary artery with unspecified angina pectoris: Secondary | ICD-10-CM

## 2020-05-26 DIAGNOSIS — I480 Paroxysmal atrial fibrillation: Secondary | ICD-10-CM

## 2020-05-26 DIAGNOSIS — I110 Hypertensive heart disease with heart failure: Secondary | ICD-10-CM

## 2020-05-26 NOTE — Patient Instructions (Signed)
Medication Instructions:  Your physician recommends that you continue on your current medications as directed. Please refer to the Current Medication list given to you today.  *If you need a refill on your cardiac medications before your next appointment, please call your pharmacy*   Lab Work: Your physician recommends that you return for lab work in: TODAY CMP, Lipids, BNP If you have labs (blood work) drawn today and your tests are completely normal, you will receive your results only by: Marland Kitchen MyChart Message (if you have MyChart) OR . A paper copy in the mail If you have any lab test that is abnormal or we need to change your treatment, we will call you to review the results.   Testing/Procedures: None   Follow-Up: At [facility], you and your health needs are our priority.  As part of our continuing mission to provide you with exceptional heart care, we have created designated Provider Care Teams.  These Care Teams include your primary Cardiologist (physician) and Advanced Practice Providers (APPs -  Physician Assistants and Nurse Practitioners) who all work together to provide you with the care you need, when you need it.  We recommend signing up for the patient portal called "MyChart".  Sign up information is provided on this After Visit Summary.  MyChart is used to connect with patients for Virtual Visits (Telemedicine).  Patients are able to view lab/test results, encounter notes, upcoming appointments, etc.  Non-urgent messages can be sent to your provider as well.   To learn more about what you can do with MyChart, go to [email or web address].    Your next appointment:   6 month(s)  The format for your next appointment:   In Person  Provider:   Shirlee More, MD   Other Instructions

## 2020-05-27 LAB — COMPREHENSIVE METABOLIC PANEL
ALT: 21 IU/L (ref 0–32)
AST: 25 IU/L (ref 0–40)
Albumin/Globulin Ratio: 1.8 (ref 1.2–2.2)
Albumin: 4.3 g/dL (ref 3.6–4.6)
Alkaline Phosphatase: 112 IU/L (ref 48–121)
BUN/Creatinine Ratio: 14 (ref 12–28)
BUN: 30 mg/dL — ABNORMAL HIGH (ref 8–27)
Bilirubin Total: 0.3 mg/dL (ref 0.0–1.2)
CO2: 25 mmol/L (ref 20–29)
Calcium: 9.4 mg/dL (ref 8.7–10.3)
Chloride: 99 mmol/L (ref 96–106)
Creatinine, Ser: 2.07 mg/dL — ABNORMAL HIGH (ref 0.57–1.00)
GFR calc Af Amer: 25 mL/min/{1.73_m2} — ABNORMAL LOW (ref >59)
GFR calc non Af Amer: 22 mL/min/{1.73_m2} — ABNORMAL LOW (ref >59)
Globulin, Total: 2.4 g/dL (ref 1.5–4.5)
Glucose: 90 mg/dL (ref 65–99)
Potassium: 5 mmol/L (ref 3.5–5.2)
Sodium: 138 mmol/L (ref 134–144)
Total Protein: 6.7 g/dL (ref 6.0–8.5)

## 2020-05-27 LAB — LIPID PANEL
Chol/HDL Ratio: 2.4 ratio (ref 0.0–4.4)
Cholesterol, Total: 121 mg/dL (ref 100–199)
HDL: 51 mg/dL (ref >39)
LDL Chol Calc (NIH): 48 mg/dL (ref 0–99)
Triglycerides: 121 mg/dL (ref 0–149)
VLDL Cholesterol Cal: 22 mg/dL (ref 5–40)

## 2020-05-27 LAB — BRAIN NATRIURETIC PEPTIDE: BNP: 415.9 pg/mL — ABNORMAL HIGH (ref 0.0–100.0)

## 2020-05-28 ENCOUNTER — Telehealth: Payer: Self-pay

## 2020-05-28 DIAGNOSIS — I9589 Other hypotension: Secondary | ICD-10-CM

## 2020-05-28 DIAGNOSIS — I480 Paroxysmal atrial fibrillation: Secondary | ICD-10-CM

## 2020-05-28 NOTE — Telephone Encounter (Signed)
-----   Message from Richardo Priest, MD sent at 05/28/2020  7:59 AM EDT ----- Normal or stable result  Good results except her creatinine continues to rise lets reduce her diuretic torsemide 20 mg only on Monday Wednesday and Fridays, repeat BMP in about 2 to 3 weeks

## 2020-05-28 NOTE — Telephone Encounter (Signed)
Spoke with patients daughter regarding results and recommendation.  She verbalizes understanding and is agreeable to plan of care. Advised patient to call back with any issues or concerns.  

## 2020-06-02 ENCOUNTER — Other Ambulatory Visit: Payer: Self-pay

## 2020-06-02 ENCOUNTER — Telehealth: Payer: Self-pay | Admitting: Neurology

## 2020-06-02 ENCOUNTER — Ambulatory Visit: Payer: PPO | Admitting: Neurology

## 2020-06-02 ENCOUNTER — Encounter: Payer: Self-pay | Admitting: Neurology

## 2020-06-02 VITALS — BP 177/61 | HR 57 | Ht 64.0 in | Wt 192.0 lb

## 2020-06-02 DIAGNOSIS — R269 Unspecified abnormalities of gait and mobility: Secondary | ICD-10-CM

## 2020-06-02 DIAGNOSIS — E1142 Type 2 diabetes mellitus with diabetic polyneuropathy: Secondary | ICD-10-CM

## 2020-06-02 DIAGNOSIS — R200 Anesthesia of skin: Secondary | ICD-10-CM

## 2020-06-02 DIAGNOSIS — G301 Alzheimer's disease with late onset: Secondary | ICD-10-CM | POA: Diagnosis not present

## 2020-06-02 DIAGNOSIS — D329 Benign neoplasm of meninges, unspecified: Secondary | ICD-10-CM

## 2020-06-02 DIAGNOSIS — F0280 Dementia in other diseases classified elsewhere without behavioral disturbance: Secondary | ICD-10-CM

## 2020-06-02 NOTE — Telephone Encounter (Signed)
health team order sent to GI. No auth they will reach out to the patient to schedule.  

## 2020-06-02 NOTE — Progress Notes (Signed)
[facility]  PATIENT: Cheryl Summers DOB: 28-Oct-1940  REFERRING DOCTOR OR PCP:  Nicholos Johns, MD SOURCE: Patient, notes from primary care, imaging report, CT scan personally reviewed.  _________________________________   HISTORICAL  CHIEF COMPLAINT:  Chief Complaint  Patient presents with  . Follow-up    pt with daughter , rm 43. following up today. states that forgetfullness is still a concern but otherwise doing well.    HISTORY OF PRESENT ILLNESS:  I had the pleasure seeing your patient, Cheryl Summers, at [facility] for neurologic consultation regarding her abnormal CT scan, memory loss and frequent falls.  Update 06/02/2020. She feels her memory issues are mild and she is doing better but her daughter notes she is doing worse.  She notes that memory has worsened further and she is doing worse with processing.  She no longer drives.  She lives by herself with 2 dogs.   She fired Home Health when they showed up twice.   Her daughter checks in on her on a daily basis.   She has not left appliances on.     She was placed on donepezil and Namenda but there have not been any definite improvement noted.  She has continued to worsen.  She has no behavioral issues.   She sleeps well.     She continues to have difficulties with gait and balance.  She uses a cane.  She needs to look down when she walks.  She has numbness in her feet.  She does have diabetes.  Lab work for polyneuropathy including SPEP, B12 and SSA's/SSB was normal or negative.  CT scan of the head 09/24/2019 showed atrophy and chronic microvascular ischemic changes.  I showed the images to her and her daughter.     History from original consultation 12/05/2019: She is an 79 year old woman who was having more trouble with memory loss and falls.   Due to these issues, she had a CT scan performed.  I personally reviewed the CT scan images from 09/25/2019.  It showed mild generalized  atrophy, chronic microvascular ischemic changes and a 1.8 cm calcified mass consistent with meningioma.  There did not appear to be any significant mass-effect on the adjacent brain from the meningioma.  Gait issues have slowly developed over the last decade.  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  She had lumbar fusion in 2019 at L4-L5.  She has been using a walker since before the surgery.   She feels off balanced and her left leg will sometimes give out.   She has used a shower chair the last year.    She has had difficulty with memory loss over the past 6-7 years.   However, since she had back surgery (daughter reports she was under anaesthesia for 6 hours), she and daughter feels he memory has been worse.     She is forgetful and misplaces items.     She has forgotten to pay bills.   She no longer drives.   Her daughter manages finances the past 2 years.     MMSE - Mini Mental State Exam 12/03/2019  Orientation to time 0  Orientation to Place 5  Registration 3  Attention/ Calculation 0  Recall 0  Language- name 2 objects 2  Language- repeat 1  Language- follow 3 step command 3  Language- read & follow direction 1  Write a sentence 1  Copy design 0  Total score 16  REVIEW OF SYSTEMS: Constitutional: No fevers, chills, sweats, or change in appetite Eyes: No visual changes, double vision, eye pain Ear, nose and throat: No hearing loss, ear pain, nasal congestion, sore throat Cardiovascular: No chest pain, palpitations Respiratory: No shortness of breath at rest or with exertion.   No wheezes GastrointestinaI: No nausea, vomiting, diarrhea, abdominal pain, fecal incontinence Genitourinary: No dysuria, urinary retention or frequency.  No nocturia. Musculoskeletal: No neck pain, back pain Integumentary: No rash, pruritus, skin lesions Neurological: as above Psychiatric: No depression at this time.  No anxiety Endocrine: No palpitations,  diaphoresis, change in appetite, change in weigh or increased thirst Hematologic/Lymphatic: No anemia, purpura, petechiae. Allergic/Immunologic: No itchy/runny eyes, nasal congestion, recent allergic reactions, rashes  ALLERGIES: No Known Allergies  HOME MEDICATIONS:  Current Outpatient Medications:  .  albuterol (PROVENTIL HFA;VENTOLIN HFA) 108 (90 Base) MCG/ACT inhaler, Inhale 2 puffs into the lungs every 6 (six) hours as needed for wheezing or shortness of breath., Disp: , Rfl:  .  albuterol (PROVENTIL) (2.5 MG/3ML) 0.083% nebulizer solution, Take 2.5 mg by nebulization daily as needed for wheezing or shortness of breath., Disp: , Rfl:  .  allopurinol (ZYLOPRIM) 100 MG tablet, Take 100 mg by mouth daily as needed., Disp: , Rfl:  .  amLODipine (NORVASC) 5 MG tablet, Take 5 mg by mouth daily., Disp: , Rfl:  .  Artificial Tear Solution (SOOTHE XP) SOLN, Place 1 drop into both eyes daily as needed (dry eyes)., Disp: , Rfl:  .  aspirin EC 81 MG tablet, Take 81 mg by mouth daily., Disp: , Rfl:  .  atorvastatin (LIPITOR) 20 MG tablet, Take 20 mg by mouth at bedtime. , Disp: , Rfl:  .  budesonide-formoterol (SYMBICORT) 160-4.5 MCG/ACT inhaler, Inhale 2 puffs into the lungs 2 (two) times daily as needed (shortness of breath). , Disp: , Rfl:  .  buPROPion (WELLBUTRIN XL) 150 MG 24 hr tablet, Take 150 mg by mouth at bedtime. , Disp: , Rfl:  .  cloNIDine (CATAPRES) 0.2 MG tablet, Take 0.2 mg by mouth in the morning and at bedtime., Disp: , Rfl:  .  dapagliflozin propanediol (FARXIGA) 5 MG TABS tablet, Take 5 mg by mouth daily. , Disp: , Rfl:  .  donepezil (ARICEPT) 10 MG tablet, Take 10 mg by mouth at bedtime., Disp: , Rfl:  .  ezetimibe (ZETIA) 10 MG tablet, Take 5 mg by mouth at bedtime. , Disp: , Rfl:  .  fluticasone (FLONASE) 50 MCG/ACT nasal spray, as needed., Disp: , Rfl:  .  memantine (NAMENDA XR) 28 MG CP24 24 hr capsule, Take 1 capsule by mouth daily., Disp: , Rfl:  .  montelukast  (SINGULAIR) 10 MG tablet, Take 10 mg by mouth at bedtime. , Disp: , Rfl:  .  Multiple Vitamin (MULTIVITAMIN) tablet, Take 1 tablet by mouth daily., Disp: , Rfl:  .  nystatin-triamcinolone (MYCOLOG II) cream, Apply 1 application topically daily as needed (skin bumps)., Disp: , Rfl:  .  omeprazole (PRILOSEC) 20 MG capsule, Take 20 mg by mouth daily as needed (acid reflux). , Disp: , Rfl:  .  polyethylene glycol (MIRALAX) packet, Take 17 g by mouth daily., Disp: 14 each, Rfl: 0 .  pregabalin (LYRICA) 75 MG capsule, Take 75 mg by mouth 2 (two) times daily., Disp: , Rfl:  .  sertraline (ZOLOFT) 50 MG tablet, Take 50 mg by mouth at bedtime. , Disp: , Rfl:  .  sodium chloride (OCEAN) 0.65 % SOLN nasal spray, Place 1  spray into both nostrils as needed for congestion., Disp: , Rfl:  .  telmisartan (MICARDIS) 80 MG tablet, Take 80 mg by mouth daily., Disp: , Rfl:  .  tiZANidine (ZANAFLEX) 2 MG tablet, Take 2 mg by mouth at bedtime. , Disp: , Rfl:  .  torsemide (DEMADEX) 20 MG tablet, Take 20 mg by mouth 3 (three) times a week., Disp: , Rfl:   Current Facility-Administered Medications:  .  betamethasone acetate-betamethasone sodium phosphate (CELESTONE) injection 12 mg, 12 mg, Other, Once, Magnus Sinning, MD  PAST MEDICAL HISTORY: Past Medical History:  Diagnosis Date  . Anxiety   . Carotid artery stenosis, asymptomatic    50% ASYMPTOMATIC  RIGHT CAROTID STENOSIS  . Chronic diastolic heart failure ([place]) 06/09/2015  . COPD (chronic obstructive pulmonary disease) ([place])   . Depression   . Diabetes mellitus without complication ([place])   . Dysrhythmia   . Gastroesophageal reflux   . Hyperlipidemia   . Hypertension   . Hypertensive heart disease with heart failure ([place]) 06/09/2015  . PAF (paroxysmal atrial fibrillation) ([place]) 06/09/2015   Overview:  CHADS2 vasc score=6  . Renal insufficiency   . Tobacco abuse   . Tremors of nervous system     PAST SURGICAL HISTORY: Past Surgical History:    Procedure Laterality Date  . ABDOMINAL HYSTERECTOMY    . CATARACT EXTRACTION  2016  . CORONARY ARTERY BYPASS GRAFT  2012  . NOSE SURGERY      FAMILY HISTORY: Family History  Problem Relation Age of Onset  . Heart attack Mother   . Hypertension Mother   . Hyperlipidemia Mother   . Heart attack Father   . Hypertension Father   . Hyperlipidemia Father   . Heart attack Sister   . Stroke Brother   . Heart attack Brother   . Hypertension Daughter     SOCIAL HISTORY:  Social History   Socioeconomic History  . Marital status: Widowed    Spouse name: Not on file  . Number of children: 4  . Years of education: 75  . Highest education level: Not on file  Occupational History  . Not on file  Tobacco Use  . Smoking status: Former Research scientist (life sciences)  . Smokeless tobacco: Never Used  Vaping Use  . Vaping Use: Never used  Substance and Sexual Activity  . Alcohol use: No  . Drug use: No  . Sexual activity: Not on file  Other Topics Concern  . Not on file  Social History Narrative   Right handed    Lives with grandson and twins   Caffeine use: daily coffee/tea.   Social Determinants of Health   Financial Resource Strain:   . Difficulty of Paying Living Expenses:   Food Insecurity:   . Worried About Charity fundraiser in the Last Year:   . Arboriculturist in the Last Year:   Transportation Needs:   . Film/video editor (Medical):   Marland Kitchen Lack of Transportation (Non-Medical):   Physical Activity:   . Days of Exercise per Week:   . Minutes of Exercise per Session:   Stress:   . Feeling of Stress :   Social Connections:   . Frequency of Communication with Friends and Family:   . Frequency of Social Gatherings with Friends and Family:   . Attends Religious Services:   . Active Member of Clubs or Organizations:   . Attends Archivist Meetings:   Marland Kitchen Marital Status:   Intimate Partner Violence:   .  Fear of Current or Ex-Partner:   . Emotionally Abused:   Marland Kitchen Physically  Abused:   . Sexually Abused:      PHYSICAL EXAM  Vitals:   06/02/20 1051  BP: (!) 177/61  Pulse: (!) 57  Weight: 192 lb (87.1 kg)  Height: 5\' 4"  (1.626 m)    Body mass index is 32.96 kg/m.   General: The patient is well-developed and well-nourished and in no acute distress  HEENT:  Head is [place]/AT.  Sclera are anicteric.    Neck: No carotid bruits are noted.  The neck is nontender.  Cardiovascular: The heart has a regular rate and rhythm with a normal S1 and S2. There were no murmurs, gallops or rubs.    Skin: Extremities are without rash or edema.   Neurologic Exam  Mental status: The patient is alert and oriented x 2 (20??, no date) at the time of the examination.  She has reduced short-term memory and attention span.Marland Kitchen   Speech is normal.   She scored 16/30 on the MMSE today.    Cranial nerves: Extraocular movements are full. Facial symmetry is present. There is good facial sensation to soft touch bilaterally.Facial strength is normal.  Trapezius and sternocleidomastoid strength is normal. No dysarthria is noted.  The tongue is midline, and the patient has symmetric elevation of the soft palate. No obvious hearing deficits are noted.  Motor:  Muscle bulk is normal.   Tone is normal. Strength is  5 / 5 in all 4 extremities.   Sensory: Sensory testing is intact to pinprick, soft touch and vibration sensation in arms but reduced vibration in legs, nearly absent at ankles and toes.   Mild reduced pinprick in feet vs. Knees.    Coordination: Cerebellar testing reveals good finger-nose-finger and reduced heel-to-shin bilaterally.  Gait and station: Station is normal.   Gait is arthritic but she is able to walk without a cane  She is a little off balance when she turns.  Stride is reduced .  She has a positive Romberg sign.   Reflexes: Deep tendon reflexes are symmetric and normal in the arms and absent in the legs.   Plantar responses are flexor.    DIAGNOSTIC DATA (LABS,  IMAGING, TESTING) - I reviewed patient records, labs, notes, testing and imaging myself where available.  Lab Results  Component Value Date   WBC 9.0 06/10/2018   HGB 8.2 (L) 06/10/2018   HCT 26.5 (L) 06/10/2018   MCV 97.1 06/10/2018   PLT 155 06/10/2018      Component Value Date/Time   NA 138 05/26/2020 1540   K 5.0 05/26/2020 1540   CL 99 05/26/2020 1540   CO2 25 05/26/2020 1540   GLUCOSE 90 05/26/2020 1540   GLUCOSE 109 (H) 06/10/2018 0336   BUN 30 (H) 05/26/2020 1540   CREATININE 2.07 (H) 05/26/2020 1540   CALCIUM 9.4 05/26/2020 1540   PROT 6.7 05/26/2020 1540   ALBUMIN 4.3 05/26/2020 1540   AST 25 05/26/2020 1540   ALT 21 05/26/2020 1540   ALKPHOS 112 05/26/2020 1540   BILITOT 0.3 05/26/2020 1540   GFRNONAA 22 (L) 05/26/2020 1540   GFRAA 25 (L) 05/26/2020 1540   Lab Results  Component Value Date   CHOL 121 05/26/2020   HDL 51 05/26/2020   LDLCALC 48 05/26/2020   TRIG 121 05/26/2020   CHOLHDL 2.4 05/26/2020   Lab Results  Component Value Date   HGBA1C 5.4 06/06/2018   Lab Results  Component Value  Date   VITAMINB12 1,151 12/03/2019        ASSESSMENT AND PLAN  Late onset Alzheimer'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   Due to worsening of memory loss and to monitor the meningioma we will check an MRI of the brain.  This will also allow [place] to determine the pattern of atrophy and extent of chronic microvascular ischemic change or other issue.  Due to difficulties with gait we will check an MRI of the cervical spine to rule out myelopathy. 2.  Continue medications. 3.   We discussed that she will likely progress further and there will be a time soon when the  current living situation will not work and then she would need to either live with family, have more frequent in-home health or go to assisted living. 4.   Return in 1 year or sooner for new or worsening neurologic symptoms.   Birdie Fetty A. Felecia Shelling, MD, [facility] 12/02/9789, 5:04 PM Certified in Neurology, Clinical Neurophysiology, Sleep Medicine and Neuroimaging  [facility] [address] [place], [place] [address] [phone]

## 2020-06-05 ENCOUNTER — Ambulatory Visit
Admission: RE | Admit: 2020-06-05 | Discharge: 2020-06-05 | Disposition: A | Discharge status: Home / Self Care | Payer: PPO | Source: Ambulatory Visit | Attending: Neurology | Admitting: Neurology

## 2020-06-05 DIAGNOSIS — R269 Unspecified abnormalities of gait and mobility: Secondary | ICD-10-CM

## 2020-06-05 DIAGNOSIS — F0280 Dementia in other diseases classified elsewhere without behavioral disturbance: Secondary | ICD-10-CM

## 2020-06-05 DIAGNOSIS — R200 Anesthesia of skin: Secondary | ICD-10-CM

## 2020-06-05 DIAGNOSIS — D329 Benign neoplasm of meninges, unspecified: Secondary | ICD-10-CM

## 2020-06-05 DIAGNOSIS — G301 Alzheimer's disease with late onset: Secondary | ICD-10-CM

## 2020-06-05 MED ORDER — GADOBENATE DIMEGLUMINE 529 MG/ML IV SOLN
15.0000 mL | Freq: Once | INTRAVENOUS | Status: AC | PRN
  Administered 2020-06-05: 15 mL via INTRAVENOUS

## 2020-06-09 ENCOUNTER — Telehealth: Payer: Self-pay | Admitting: *Deleted

## 2020-06-09 NOTE — Telephone Encounter (Signed)
-----   Message from Britt Bottom, MD sent at 06/06/2020  4:43 PM EDT ----- The MRI of the brain showed age-related findings including some atrophy and chronic microvascular ischemic changes (age-related white changes in the brain).  Nothing look to be new.  The MRI of the cervical spine showed a small disc herniation at the C4-C5 level but it was not compressing the nerve root or spinal cord

## 2020-06-09 NOTE — Telephone Encounter (Signed)
Called and spoke with patient's daughter, [name] (on [place]). Relayed results per Dr. Felecia Shelling note. She verbalized understanding. She asked for update her her own referral to our office. I transferred her to [facility] in referrals.

## 2020-06-11 ENCOUNTER — Emergency Department (HOSPITAL_COMMUNITY): Payer: PPO

## 2020-06-11 ENCOUNTER — Other Ambulatory Visit: Payer: Self-pay

## 2020-06-11 ENCOUNTER — Observation Stay (HOSPITAL_COMMUNITY): Payer: PPO

## 2020-06-11 ENCOUNTER — Inpatient Hospital Stay (HOSPITAL_COMMUNITY)
Admission: EM | Admit: 2020-06-11 | Discharge: 2020-06-13 | DRG: 064 | Disposition: A | Discharge status: Hospice | Payer: PPO | Attending: Family Medicine | Admitting: Family Medicine

## 2020-06-11 DIAGNOSIS — G319 Degenerative disease of nervous system, unspecified: Secondary | ICD-10-CM | POA: Diagnosis not present

## 2020-06-11 DIAGNOSIS — R062 Wheezing: Secondary | ICD-10-CM | POA: Diagnosis not present

## 2020-06-11 DIAGNOSIS — I2510 Atherosclerotic heart disease of native coronary artery without angina pectoris: Secondary | ICD-10-CM | POA: Diagnosis not present

## 2020-06-11 DIAGNOSIS — Z823 Family history of stroke: Secondary | ICD-10-CM

## 2020-06-11 DIAGNOSIS — G8190 Hemiplegia, unspecified affecting unspecified side: Secondary | ICD-10-CM

## 2020-06-11 DIAGNOSIS — I639 Cerebral infarction, unspecified: Secondary | ICD-10-CM | POA: Diagnosis not present

## 2020-06-11 DIAGNOSIS — E1122 Type 2 diabetes mellitus with diabetic chronic kidney disease: Secondary | ICD-10-CM | POA: Diagnosis not present

## 2020-06-11 DIAGNOSIS — E669 Obesity, unspecified: Secondary | ICD-10-CM | POA: Diagnosis present

## 2020-06-11 DIAGNOSIS — R29709 NIHSS score 9: Secondary | ICD-10-CM | POA: Diagnosis not present

## 2020-06-11 DIAGNOSIS — I708 Atherosclerosis of other arteries: Secondary | ICD-10-CM | POA: Diagnosis present

## 2020-06-11 DIAGNOSIS — R29810 Facial weakness: Secondary | ICD-10-CM | POA: Diagnosis present

## 2020-06-11 DIAGNOSIS — Z6836 Body mass index (BMI) 36.0-36.9, adult: Secondary | ICD-10-CM

## 2020-06-11 DIAGNOSIS — Z7982 Long term (current) use of aspirin: Secondary | ICD-10-CM

## 2020-06-11 DIAGNOSIS — I5023 Acute on chronic systolic (congestive) heart failure: Secondary | ICD-10-CM | POA: Diagnosis not present

## 2020-06-11 DIAGNOSIS — I361 Nonrheumatic tricuspid (valve) insufficiency: Secondary | ICD-10-CM | POA: Diagnosis not present

## 2020-06-11 DIAGNOSIS — I679 Cerebrovascular disease, unspecified: Secondary | ICD-10-CM | POA: Diagnosis not present

## 2020-06-11 DIAGNOSIS — I82622 Acute embolism and thrombosis of deep veins of left upper extremity: Secondary | ICD-10-CM

## 2020-06-11 DIAGNOSIS — I6330 Cerebral infarction due to thrombosis of unspecified cerebral artery: Secondary | ICD-10-CM | POA: Diagnosis not present

## 2020-06-11 DIAGNOSIS — Z515 Encounter for palliative care: Secondary | ICD-10-CM | POA: Diagnosis not present

## 2020-06-11 DIAGNOSIS — E1165 Type 2 diabetes mellitus with hyperglycemia: Secondary | ICD-10-CM | POA: Diagnosis not present

## 2020-06-11 DIAGNOSIS — I63231 Cerebral infarction due to unspecified occlusion or stenosis of right carotid arteries: Principal | ICD-10-CM | POA: Diagnosis present

## 2020-06-11 DIAGNOSIS — E1169 Type 2 diabetes mellitus with other specified complication: Secondary | ICD-10-CM | POA: Diagnosis present

## 2020-06-11 DIAGNOSIS — Z66 Do not resuscitate: Secondary | ICD-10-CM | POA: Diagnosis not present

## 2020-06-11 DIAGNOSIS — G8194 Hemiplegia, unspecified affecting left nondominant side: Secondary | ICD-10-CM | POA: Diagnosis present

## 2020-06-11 DIAGNOSIS — E1142 Type 2 diabetes mellitus with diabetic polyneuropathy: Secondary | ICD-10-CM | POA: Diagnosis present

## 2020-06-11 DIAGNOSIS — R1310 Dysphagia, unspecified: Secondary | ICD-10-CM | POA: Diagnosis not present

## 2020-06-11 DIAGNOSIS — I110 Hypertensive heart disease with heart failure: Secondary | ICD-10-CM | POA: Diagnosis present

## 2020-06-11 DIAGNOSIS — I5181 Takotsubo syndrome: Secondary | ICD-10-CM | POA: Diagnosis present

## 2020-06-11 DIAGNOSIS — I5032 Chronic diastolic (congestive) heart failure: Secondary | ICD-10-CM | POA: Diagnosis present

## 2020-06-11 DIAGNOSIS — K219 Gastro-esophageal reflux disease without esophagitis: Secondary | ICD-10-CM | POA: Diagnosis present

## 2020-06-11 DIAGNOSIS — G459 Transient cerebral ischemic attack, unspecified: Secondary | ICD-10-CM | POA: Diagnosis not present

## 2020-06-11 DIAGNOSIS — R296 Repeated falls: Secondary | ICD-10-CM | POA: Diagnosis present

## 2020-06-11 DIAGNOSIS — Z20822 Contact with and (suspected) exposure to covid-19: Secondary | ICD-10-CM | POA: Diagnosis not present

## 2020-06-11 DIAGNOSIS — I771 Stricture of artery: Secondary | ICD-10-CM | POA: Diagnosis present

## 2020-06-11 DIAGNOSIS — F0390 Unspecified dementia without behavioral disturbance: Secondary | ICD-10-CM | POA: Diagnosis present

## 2020-06-11 DIAGNOSIS — R0602 Shortness of breath: Secondary | ICD-10-CM

## 2020-06-11 DIAGNOSIS — I6782 Cerebral ischemia: Secondary | ICD-10-CM | POA: Diagnosis not present

## 2020-06-11 DIAGNOSIS — Z79899 Other long term (current) drug therapy: Secondary | ICD-10-CM

## 2020-06-11 DIAGNOSIS — I152 Hypertension secondary to endocrine disorders: Secondary | ICD-10-CM | POA: Diagnosis present

## 2020-06-11 DIAGNOSIS — R531 Weakness: Secondary | ICD-10-CM | POA: Diagnosis not present

## 2020-06-11 DIAGNOSIS — E1159 Type 2 diabetes mellitus with other circulatory complications: Secondary | ICD-10-CM | POA: Diagnosis not present

## 2020-06-11 DIAGNOSIS — I6523 Occlusion and stenosis of bilateral carotid arteries: Secondary | ICD-10-CM | POA: Diagnosis not present

## 2020-06-11 DIAGNOSIS — F419 Anxiety disorder, unspecified: Secondary | ICD-10-CM | POA: Diagnosis present

## 2020-06-11 DIAGNOSIS — Z8249 Family history of ischemic heart disease and other diseases of the circulatory system: Secondary | ICD-10-CM

## 2020-06-11 DIAGNOSIS — I340 Nonrheumatic mitral (valve) insufficiency: Secondary | ICD-10-CM | POA: Diagnosis not present

## 2020-06-11 DIAGNOSIS — I5021 Acute systolic (congestive) heart failure: Secondary | ICD-10-CM | POA: Clinically undetermined

## 2020-06-11 DIAGNOSIS — I6389 Other cerebral infarction: Secondary | ICD-10-CM | POA: Diagnosis not present

## 2020-06-11 DIAGNOSIS — R471 Dysarthria and anarthria: Secondary | ICD-10-CM | POA: Diagnosis present

## 2020-06-11 DIAGNOSIS — J189 Pneumonia, unspecified organism: Secondary | ICD-10-CM | POA: Diagnosis not present

## 2020-06-11 DIAGNOSIS — Z7189 Other specified counseling: Secondary | ICD-10-CM | POA: Diagnosis not present

## 2020-06-11 DIAGNOSIS — M545 Low back pain: Secondary | ICD-10-CM | POA: Diagnosis present

## 2020-06-11 DIAGNOSIS — N1832 Chronic kidney disease, stage 3b: Secondary | ICD-10-CM | POA: Diagnosis present

## 2020-06-11 DIAGNOSIS — Z87891 Personal history of nicotine dependence: Secondary | ICD-10-CM

## 2020-06-11 DIAGNOSIS — I480 Paroxysmal atrial fibrillation: Secondary | ICD-10-CM | POA: Diagnosis present

## 2020-06-11 DIAGNOSIS — Z951 Presence of aortocoronary bypass graft: Secondary | ICD-10-CM | POA: Diagnosis not present

## 2020-06-11 DIAGNOSIS — I509 Heart failure, unspecified: Secondary | ICD-10-CM | POA: Diagnosis not present

## 2020-06-11 DIAGNOSIS — I10 Essential (primary) hypertension: Secondary | ICD-10-CM | POA: Diagnosis not present

## 2020-06-11 DIAGNOSIS — H9190 Unspecified hearing loss, unspecified ear: Secondary | ICD-10-CM | POA: Diagnosis present

## 2020-06-11 DIAGNOSIS — I371 Nonrheumatic pulmonary valve insufficiency: Secondary | ICD-10-CM | POA: Diagnosis not present

## 2020-06-11 DIAGNOSIS — Z83438 Family history of other disorder of lipoprotein metabolism and other lipidemia: Secondary | ICD-10-CM

## 2020-06-11 DIAGNOSIS — D329 Benign neoplasm of meninges, unspecified: Secondary | ICD-10-CM | POA: Diagnosis present

## 2020-06-11 DIAGNOSIS — Z90710 Acquired absence of both cervix and uterus: Secondary | ICD-10-CM

## 2020-06-11 DIAGNOSIS — J449 Chronic obstructive pulmonary disease, unspecified: Secondary | ICD-10-CM | POA: Diagnosis not present

## 2020-06-11 DIAGNOSIS — Z7951 Long term (current) use of inhaled steroids: Secondary | ICD-10-CM

## 2020-06-11 DIAGNOSIS — R0902 Hypoxemia: Secondary | ICD-10-CM | POA: Diagnosis not present

## 2020-06-11 DIAGNOSIS — E785 Hyperlipidemia, unspecified: Secondary | ICD-10-CM | POA: Diagnosis present

## 2020-06-11 DIAGNOSIS — I672 Cerebral atherosclerosis: Secondary | ICD-10-CM | POA: Diagnosis present

## 2020-06-11 DIAGNOSIS — E876 Hypokalemia: Secondary | ICD-10-CM | POA: Diagnosis present

## 2020-06-11 DIAGNOSIS — Z981 Arthrodesis status: Secondary | ICD-10-CM

## 2020-06-11 DIAGNOSIS — J90 Pleural effusion, not elsewhere classified: Secondary | ICD-10-CM | POA: Diagnosis not present

## 2020-06-11 DIAGNOSIS — I618 Other nontraumatic intracerebral hemorrhage: Secondary | ICD-10-CM | POA: Diagnosis not present

## 2020-06-11 LAB — COMPREHENSIVE METABOLIC PANEL
ALT: 25 U/L (ref 0–44)
AST: 40 U/L (ref 15–41)
Albumin: 4 g/dL (ref 3.5–5.0)
Alkaline Phosphatase: 97 U/L (ref 38–126)
Anion gap: 16 — ABNORMAL HIGH (ref 5–15)
BUN: 28 mg/dL — ABNORMAL HIGH (ref 8–23)
CO2: 18 mmol/L — ABNORMAL LOW (ref 22–32)
Calcium: 9.5 mg/dL (ref 8.9–10.3)
Chloride: 111 mmol/L (ref 98–111)
Creatinine, Ser: 1.89 mg/dL — ABNORMAL HIGH (ref 0.44–1.00)
GFR calc Af Amer: 28 mL/min — ABNORMAL LOW (ref >60)
GFR calc non Af Amer: 24 mL/min — ABNORMAL LOW (ref >60)
Glucose, Bld: 112 mg/dL — ABNORMAL HIGH (ref 70–99)
Potassium: 4.6 mmol/L (ref 3.5–5.1)
Sodium: 145 mmol/L (ref 135–145)
Total Bilirubin: 0.7 mg/dL (ref 0.3–1.2)
Total Protein: 7.2 g/dL (ref 6.5–8.1)

## 2020-06-11 LAB — I-STAT CHEM 8, ED
BUN: 31 mg/dL — ABNORMAL HIGH (ref 8–23)
Calcium, Ion: 1.05 mmol/L — ABNORMAL LOW (ref 1.15–1.40)
Chloride: 113 mmol/L — ABNORMAL HIGH (ref 98–111)
Creatinine, Ser: 1.8 mg/dL — ABNORMAL HIGH (ref 0.44–1.00)
Glucose, Bld: 112 mg/dL — ABNORMAL HIGH (ref 70–99)
HCT: 39 % (ref 36.0–46.0)
Hemoglobin: 13.3 g/dL (ref 12.0–15.0)
Potassium: 4.3 mmol/L (ref 3.5–5.1)
Sodium: 144 mmol/L (ref 135–145)
TCO2: 19 mmol/L — ABNORMAL LOW (ref 22–32)

## 2020-06-11 LAB — SARS CORONAVIRUS 2 BY RT PCR (HOSPITAL ORDER, PERFORMED IN ~~LOC~~ HOSPITAL LAB): SARS Coronavirus 2: NEGATIVE

## 2020-06-11 LAB — CBC
HCT: 42.7 % (ref 36.0–46.0)
Hemoglobin: 13 g/dL (ref 12.0–15.0)
MCH: 29.3 pg (ref 26.0–34.0)
MCHC: 30.4 g/dL (ref 30.0–36.0)
MCV: 96.4 fL (ref 80.0–100.0)
Platelets: 237 10*3/uL (ref 150–400)
RBC: 4.43 MIL/uL (ref 3.87–5.11)
RDW: 14.2 % (ref 11.5–15.5)
WBC: 19 10*3/uL — ABNORMAL HIGH (ref 4.0–10.5)
nRBC: 0 % (ref 0.0–0.2)

## 2020-06-11 LAB — APTT: aPTT: 29 seconds (ref 24–36)

## 2020-06-11 LAB — DIFFERENTIAL
Abs Immature Granulocytes: 0.09 10*3/uL — ABNORMAL HIGH (ref 0.00–0.07)
Basophils Absolute: 0 10*3/uL (ref 0.0–0.1)
Basophils Relative: 0 %
Eosinophils Absolute: 0 10*3/uL (ref 0.0–0.5)
Eosinophils Relative: 0 %
Immature Granulocytes: 1 %
Lymphocytes Relative: 3 %
Lymphs Abs: 0.5 10*3/uL — ABNORMAL LOW (ref 0.7–4.0)
Monocytes Absolute: 1.9 10*3/uL — ABNORMAL HIGH (ref 0.1–1.0)
Monocytes Relative: 10 %
Neutro Abs: 16.5 10*3/uL — ABNORMAL HIGH (ref 1.7–7.7)
Neutrophils Relative %: 86 %

## 2020-06-11 LAB — BRAIN NATRIURETIC PEPTIDE: B Natriuretic Peptide: 2163.4 pg/mL — ABNORMAL HIGH (ref 0.0–100.0)

## 2020-06-11 LAB — PROTIME-INR
INR: 1 (ref 0.8–1.2)
Prothrombin Time: 12.8 seconds (ref 11.4–15.2)

## 2020-06-11 LAB — CBG MONITORING, ED: Glucose-Capillary: 98 mg/dL (ref 70–99)

## 2020-06-11 MED ORDER — ALBUTEROL SULFATE HFA 108 (90 BASE) MCG/ACT IN AERS: 2.0000 | INHALATION_SPRAY | RESPIRATORY_TRACT | Status: DC

## 2020-06-11 MED ORDER — MOMETASONE FURO-FORMOTEROL FUM 200-5 MCG/ACT IN AERO
2.0000 | INHALATION_SPRAY | Freq: Two times a day (BID) | RESPIRATORY_TRACT | Status: DC
  Administered 2020-06-12 – 2020-06-13 (×2): 2 via RESPIRATORY_TRACT
  Filled 2020-06-11: qty 8.8

## 2020-06-11 MED ORDER — IOHEXOL 350 MG/ML SOLN
100.0000 mL | Freq: Once | INTRAVENOUS | Status: AC | PRN
  Administered 2020-06-11: 100 mL via INTRAVENOUS

## 2020-06-11 MED ORDER — ALBUTEROL SULFATE (2.5 MG/3ML) 0.083% IN NEBU
3.0000 mL | INHALATION_SOLUTION | RESPIRATORY_TRACT | Status: DC | PRN
  Administered 2020-06-12 (×2): 3 mL via RESPIRATORY_TRACT
  Filled 2020-06-11: qty 3

## 2020-06-11 MED ORDER — POLYVINYL ALCOHOL 1.4 % OP SOLN
1.0000 [drp] | Freq: Every day | OPHTHALMIC | Status: DC | PRN
  Filled 2020-06-11: qty 15

## 2020-06-11 MED ORDER — ACETAMINOPHEN 650 MG RE SUPP: 650.0000 mg | RECTAL | Status: DC | PRN

## 2020-06-11 MED ORDER — IPRATROPIUM-ALBUTEROL 0.5-2.5 (3) MG/3ML IN SOLN
3.0000 mL | RESPIRATORY_TRACT | Status: DC
  Administered 2020-06-12: 3 mL via RESPIRATORY_TRACT
  Filled 2020-06-11 (×2): qty 3

## 2020-06-11 MED ORDER — ALBUTEROL SULFATE (2.5 MG/3ML) 0.083% IN NEBU: 2.5000 mg | INHALATION_SOLUTION | Freq: Every day | RESPIRATORY_TRACT | Status: DC | PRN

## 2020-06-11 MED ORDER — HEPARIN SODIUM (PORCINE) 5000 UNIT/ML IJ SOLN
5000.0000 [IU] | Freq: Three times a day (TID) | INTRAMUSCULAR | Status: DC
  Administered 2020-06-11 – 2020-06-13 (×5): 5000 [IU] via SUBCUTANEOUS
  Filled 2020-06-11 (×5): qty 1

## 2020-06-11 MED ORDER — STROKE: EARLY STAGES OF RECOVERY BOOK
Freq: Once | Status: AC
  Filled 2020-06-11: qty 1

## 2020-06-11 MED ORDER — SOOTHE XP OP SOLN: 1.0000 [drp] | Freq: Every day | OPHTHALMIC | Status: DC | PRN

## 2020-06-11 MED ORDER — SODIUM CHLORIDE 0.9% FLUSH
3.0000 mL | Freq: Once | INTRAVENOUS | Status: AC
  Administered 2020-06-11: 3 mL via INTRAVENOUS

## 2020-06-11 MED ORDER — ALBUTEROL SULFATE HFA 108 (90 BASE) MCG/ACT IN AERS: 2.0000 | INHALATION_SPRAY | Freq: Four times a day (QID) | RESPIRATORY_TRACT | Status: DC | PRN

## 2020-06-11 MED ORDER — ACETAMINOPHEN 160 MG/5ML PO SOLN: 650.0000 mg | ORAL | Status: DC | PRN

## 2020-06-11 MED ORDER — ACETAMINOPHEN 325 MG PO TABS
650.0000 mg | ORAL_TABLET | ORAL | Status: DC | PRN
  Administered 2020-06-12: 650 mg via ORAL
  Filled 2020-06-11: qty 2

## 2020-06-11 NOTE — Progress Notes (Signed)
I saw and examined this patient.  I discussed with the full resident team and we agreed on a plan.  I will cosign the admit H&PE when it is available.  Briefly, 80 yo female with a host of chronic medical problems presents with acute left sided weakness arm>leg.  Found to have a CVA.  Issues: 1. Acute CVA.  Stroke workup in process.  Anticoag recs may be influenced by #2. 2. Acute/subacute Lt UE DVT.  I do not know the particulars.  I believe this is recently diagnosed.   3. Mild dyspnea and wheeze on exam.  She has known both HFpEF and COPD.  Recently had diuretic dose decreased because of worsening creat, which is improved on lower dose. 4. Remote hx of CAD and S/P CABG.  No chest pain. 5. Found down - or more accurately sitting on couch and could not get up.  It seems that she was down only a short period.   6. Vague Hx of dementia.  She participated in the conversation, so dementia is mild at present.    Main focus is on the WU of her acute cVA.  We will also do our best to decide whether her wheezing is cardiac or pulmonary and treat accordingly.  Dispo - she will almost certainly need SNF for rehab.

## 2020-06-11 NOTE — ED Provider Notes (Signed)
[facility] EMERGENCY DEPARTMENT Provider Note   CSN: [id number] Arrival date & time: 06/11/20  1200  An emergency department physician performed an initial assessment on this suspected stroke patient at 1200.  History Chief Complaint  Patient presents with  . Code Stroke    Cheryl Summers is a 79 y.o. female.  79 yo F here as a code stroke. R arm paralysis.  Started this morning.  EMS also felt she had SOB and started on neb treatments.  Level V caveat acuity of condition.   The history is provided by the patient and the EMS personnel.  Illness Severity:  Severe Onset quality:  Sudden Duration:  1 day Timing:  Constant Progression:  Unchanged Chronicity:  New Associated symptoms: shortness of breath        Past Medical History:  Diagnosis Date  . Anxiety   . Carotid artery stenosis, asymptomatic    50% ASYMPTOMATIC  RIGHT CAROTID STENOSIS  . Chronic diastolic heart failure ([place]) 06/09/2015  . COPD (chronic obstructive pulmonary disease) ([place])   . Depression   . Diabetes mellitus without complication ([place])   . Dysrhythmia   . Gastroesophageal reflux   . Hyperlipidemia   . Hypertension   . Hypertensive heart disease with heart failure ([place]) 06/09/2015  . PAF (paroxysmal atrial fibrillation) ([place]) 06/09/2015   Overview:  CHADS2 vasc score=6  . Renal insufficiency   . Tobacco abuse   . Tremors of nervous system     Patient Active Problem List   Diagnosis Date Noted  . Numbness 12/03/2019  . Memory loss 12/03/2019  . Meningioma ([place]) 12/03/2019  . Gait disturbance 12/03/2019  . Diabetic polyneuropathy associated with type 2 diabetes mellitus ([place]) 12/03/2019  . Dizziness 09/18/2019  . Frequent falls 09/18/2019  . Renal insufficiency 06/07/2018  . Hypotension   . Spinal stenosis, lumbar region with neurogenic claudication 06/06/2018    Class: Acute  . Spondylolisthesis, lumbar region 06/06/2018    Class: Chronic  . Status post lumbar spinal  fusion 06/06/2018  . Murmur, heart 02/24/2018  . Bradycardia 01/19/2018  . Coronary artery disease involving native coronary artery of native heart with angina pectoris ([place]) 05/29/2017  . Tobacco abuse 05/13/2017  . Hypertension 05/13/2017  . Hyperlipidemia 05/13/2017  . GERD (gastroesophageal reflux disease) 05/13/2017  . COPD (chronic obstructive pulmonary disease) ([place]) 05/13/2017  . Carotid artery stenosis 05/13/2017  . Chronic diastolic heart failure ([place]) 06/09/2015  . Hypertensive heart disease with heart failure ([place]) 06/09/2015  . PAF (paroxysmal atrial fibrillation) ([place]) 06/09/2015    Past Surgical History:  Procedure Laterality Date  . ABDOMINAL HYSTERECTOMY    . CATARACT EXTRACTION  2016  . CORONARY ARTERY BYPASS GRAFT  2012  . NOSE SURGERY       OB History   No obstetric history on file.     Family History  Problem Relation Age of Onset  . Heart attack Mother   . Hypertension Mother   . Hyperlipidemia Mother   . Heart attack Father   . Hypertension Father   . Hyperlipidemia Father   . Heart attack Sister   . Stroke Brother   . Heart attack Brother   . Hypertension Daughter     Social History   Tobacco Use  . Smoking status: Former Research scientist (life sciences)  . Smokeless tobacco: Never Used  Vaping Use  . Vaping Use: Never used  Substance Use Topics  . Alcohol use: No  . Drug use: No    Home Medications  Prior to Admission medications   Medication Sig Start Date End Date Taking? Authorizing Provider  acetaminophen (TYLENOL) 500 MG tablet Take 500-1,000 mg by mouth every 6 (six) hours as needed for mild pain or headache.   Yes [provider]  albuterol (PROVENTIL HFA;VENTOLIN HFA) 108 (90 Base) MCG/ACT inhaler Inhale 2 puffs into the lungs every 6 (six) hours as needed for wheezing or shortness of breath.   Yes [provider]  albuterol (PROVENTIL) (2.5 MG/3ML) 0.083% nebulizer solution Take 2.5 mg by nebulization daily as needed for wheezing or  shortness of breath.   Yes [provider]  allopurinol (ZYLOPRIM) 100 MG tablet Take 100 mg by mouth daily as needed (gout).    Yes [provider]  amLODipine (NORVASC) 5 MG tablet Take 5 mg by mouth daily.   Yes [provider]  Artificial Tear Solution (SOOTHE XP) SOLN Place 1 drop into both eyes daily as needed (dry eyes).   Yes [provider]  aspirin EC 81 MG tablet Take 81 mg by mouth daily.   Yes [provider]  atorvastatin (LIPITOR) 20 MG tablet Take 20 mg by mouth at bedtime.    Yes [provider]  budesonide-formoterol (SYMBICORT) 160-4.5 MCG/ACT inhaler Inhale 2 puffs into the lungs 2 (two) times daily as needed (shortness of breath).    Yes [provider]  buPROPion (WELLBUTRIN XL) 150 MG 24 hr tablet Take 150 mg by mouth at bedtime.    Yes [provider]  cloNIDine (CATAPRES) 0.2 MG tablet Take 0.2 mg by mouth in the morning and at bedtime. 05/19/20  Yes [provider]  donepezil (ARICEPT) 10 MG tablet Take 10 mg by mouth daily.    Yes [provider]  ezetimibe (ZETIA) 10 MG tablet Take 5 mg by mouth at bedtime.    Yes [provider]  FARXIGA 10 MG TABS tablet Take 5 mg by mouth daily. 05/05/20  Yes [provider]  fluticasone (FLONASE) 50 MCG/ACT nasal spray Place 1-2 sprays into both nostrils as needed for allergies.  05/11/19  Yes [provider]  memantine (NAMENDA XR) 28 MG CP24 24 hr capsule Take 1 capsule by mouth daily. 06/11/19  Yes [provider]  montelukast (SINGULAIR) 10 MG tablet Take 10 mg by mouth at bedtime.    Yes [provider]  Multiple Vitamin (MULTIVITAMIN) tablet Take 1 tablet by mouth daily.   Yes [provider]  nystatin-triamcinolone (MYCOLOG II) cream Apply 1 application topically daily as needed (skin bumps).   Yes [provider]  omeprazole (PRILOSEC) 20 MG capsule Take 20 mg by mouth daily.     Yes [provider]  polyethylene glycol (MIRALAX) packet Take 17 g by mouth daily. Patient taking differently: Take 17 g by mouth daily as needed for mild constipation.  06/10/18  Yes Vasireddy, Grier Mitts, MD  pregabalin (LYRICA) 75 MG capsule Take 75 mg by mouth 2 (two) times daily.   Yes [provider]  sertraline (ZOLOFT) 50 MG tablet Take 50 mg by mouth at bedtime.    Yes [provider]  sodium chloride (OCEAN) 0.65 % SOLN nasal spray Place 1 spray into both nostrils as needed for congestion.   Yes [provider]  telmisartan (MICARDIS) 80 MG tablet Take 80 mg by mouth daily.   Yes [provider]  tiZANidine (ZANAFLEX) 2 MG tablet Take 2 mg by mouth at bedtime.    Yes [provider]  torsemide (  DEMADEX) 20 MG tablet Take 20 mg by mouth every Monday, Wednesday, and Friday.    Yes [provider]    Allergies    Patient has no known allergies.  Review of Systems   Review of Systems  Unable to perform ROS: Acuity of condition  Respiratory: Positive for shortness of breath.   Neurological: Positive for weakness (LUE).    Physical Exam Updated Vital Signs BP (!) 134/94   Pulse 99   Temp 98.4 F (36.9 C) (Oral)   Resp (!) 22   Ht 5\' 2"  (1.575 m)   Wt 90.7 kg   SpO2 100%   BMI 36.58 kg/m   Physical Exam Vitals and nursing note reviewed.  Constitutional:      General: She is not in acute distress.    Appearance: She is well-developed. She is not diaphoretic.  HENT:     Head: Normocephalic and atraumatic.  Eyes:     Pupils: Pupils are equal, round, and reactive to light.  Cardiovascular:     Rate and Rhythm: Normal rate and regular rhythm.     Heart sounds: No murmur heard.  No friction rub. No gallop.   Pulmonary:     Effort: Pulmonary effort is normal.     Breath sounds: No wheezing or rales.  Abdominal:     General: There is no distension.     Palpations: Abdomen is soft.     Tenderness: There is no  abdominal tenderness.  Musculoskeletal:        General: No tenderness.     Cervical back: Normal range of motion and neck supple.     Comments: LUE weakness  Skin:    General: Skin is warm and dry.  Neurological:     Mental Status: She is alert and oriented to person, place, and time.  Psychiatric:        Behavior: Behavior normal.     ED Results / Procedures / Treatments   Labs (all labs ordered are listed, but only abnormal results are displayed) Labs Reviewed  CBC - Abnormal; Notable for the following components:      Result Value   WBC 19.0 (*)    All other components within normal limits  DIFFERENTIAL - Abnormal; Notable for the following components:   Neutro Abs 16.5 (*)    Lymphs Abs 0.5 (*)    Monocytes Absolute 1.9 (*)    Abs Immature Granulocytes 0.09 (*)    All other components within normal limits  COMPREHENSIVE METABOLIC PANEL - Abnormal; Notable for the following components:   CO2 18 (*)    Glucose, Bld 112 (*)    BUN 28 (*)    Creatinine, Ser 1.89 (*)    GFR calc non Af Amer 24 (*)    GFR calc Af Amer 28 (*)    Anion gap 16 (*)    All other components within normal limits  I-STAT CHEM 8, ED - Abnormal; Notable for the following components:   Chloride 113 (*)    BUN 31 (*)    Creatinine, Ser 1.80 (*)    Glucose, Bld 112 (*)    Calcium, Ion 1.05 (*)    TCO2 19 (*)    All other components within normal limits  SARS CORONAVIRUS 2 BY RT PCR (HOSPITAL ORDER, [facility])  PROTIME-INR  APTT  CBG MONITORING, ED  CBG MONITORING, ED    EKG None  Radiology CT CEREBRAL PERFUSION W CONTRAST  Result Date:  06/11/2020 CLINICAL DATA:  Left-sided weakness EXAM: CT HEAD CODE STROKE CT ANGIOGRAPHY HEAD AND NECK CT PERFUSION BRAIN TECHNIQUE: Multidetector CT imaging of the head and neck was performed using the standard protocol during bolus administration of intravenous contrast. Multiplanar CT image reconstructions and MIPs were obtained  to evaluate the vascular anatomy. Carotid stenosis measurements (when applicable) are obtained utilizing NASCET criteria, using the distal internal carotid diameter as the denominator. Multiphase CT imaging of the brain was performed following IV bolus contrast injection. Subsequent parametric perfusion maps were calculated using RAPID software. CONTRAST:  100 mL Omnipaque 350 COMPARISON:  CT head 09/24/2019, MRI head 06/05/2020 FINDINGS: CT HEAD FINDINGS Brain: There is no acute intracranial hemorrhage or mass effect. There is a small area of hypoattenuation with loss of gray-white differentiation in the right frontal lobe involving the precentral gyrus. Additional patchy hypoattenuation in the supratentorial white matter is nonspecific but may reflect mild to moderate chronic microvascular ischemic changes. Prominence of the ventricles and sulci reflects generalized parenchymal volume loss. 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 Vascular: No hyperdense vessel. Skull: Unremarkable. Sinuses/Orbits: Retained secretions within the right sphenoid. Patchy ethmoid mucosal thickening. No acute orbital finding. Other: Mastoid air cells are clear. ASPECTS [facility] Stroke Program Early CT Score) - Ganglionic level infarction (caudate, lentiform nuclei, internal capsule, insula, M1-M3 cortex): 7 - Supraganglionic infarction (M4-M6 cortex): 2 Total score (0-10 with 10 being normal): 9 Review of the MIP images confirms the above findings CTA NECK FINDINGS Aortic arch: Calcified and noncalcified plaque along the arch and great vessel origins. 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 Right carotid system: Patent. Multifocal calcified and noncalcified plaque along the common carotid with less than 50% stenosis. Mixed plaque is present at the ICA origin causing approximately 60% stenosis. Left carotid system: Patent. Calcified and noncalcified plaque  along the common carotid causing less than 50% stenosis. Mixed plaque at the ICA origin causing approximately 50% stenosis. There is subsequent additional mixed plaque causing at least 80% stenosis. Vertebral arteries: Patent. Right vertebral artery is dominant. Mixed plaque along the left V2 segment causing moderate stenosis. Skeleton: Degenerative changes of the cervical spine. Other neck: Heterogeneous, enlarged left thyroid, which has been previously evaluated by ultrasound. Upper chest: Small bilateral pleural effusions. Interlobular septal thickening and patchy ground-glass density, which may reflect edema. Review of the MIP images confirms the above findings CTA HEAD FINDINGS Anterior circulation: Intracranial internal carotid arteries are patent with calcified plaque causing moderate stenosis. Anterior and middle cerebral arteries are patent. Posterior circulation: Intracranial vertebral arteries are patent. There is mixed plaque causing mild stenosis on the right and marked stenosis on the left. Patent PICA origins. Basilar artery is patent. Posterior cerebral arteries are patent. Venous sinuses: As permitted by contrast timing, patent. Review of the MIP images confirms the above findings CT Brain Perfusion Findings: CBF (<30%) Volume: 68mL Perfusion (Tmax>6.0s) volume: 65mL Mismatch Volume: 49mL Infarction Location: None IMPRESSION: No acute intracranial hemorrhage. Suspected small acute infarction involving the right precentral gyrus (ASPECT score 9). No large vessel occlusion. No evidence of core infarction or territory at risk by perfusion imaging. Plaque along common and internal carotid arteries bilaterally. Approximately 60% stenosis at the right ICA origin. Approximately 50% stenosis at the left ICA origin with subsequent plaque causing 80% stenosis. Moderate stenosis of the intracranial internal carotid arteries. 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 Partially imaged small  bilateral pleural effusions and suspected  mild pulmonary edema. Initial results were communicated to Dr. Leonel Ramsay at 12:35 pmon 8/18/2021by text page via the [facility] messaging system. Electronically Signed   By: Macy Mis M.D.   On: 06/11/2020 13:02   DG Chest Port 1 View  Result Date: 06/11/2020 CLINICAL DATA:  Onset shortness of breath and weakness is morning. EXAM: PORTABLE CHEST 1 VIEW COMPARISON:  PA and lateral chest 09/17/2019. FINDINGS: Extensive bilateral airspace disease is somewhat worse on the right. There are small bilateral pleural effusions. Heart size is upper normal. The patient is status post CABG. IMPRESSION: Right greater than left airspace disease and small effusions have an appearance most suggestive of congestive failure rather than pneumonia. Electronically Signed   By: Inge Rise M.D.   On: 06/11/2020 13:52   CT HEAD CODE STROKE WO CONTRAST  Result Date: 06/11/2020 CLINICAL DATA:  Left-sided weakness EXAM: CT HEAD CODE STROKE CT ANGIOGRAPHY HEAD AND NECK CT PERFUSION BRAIN TECHNIQUE: Multidetector CT imaging of the head and neck was performed using the standard protocol during bolus administration of intravenous contrast. Multiplanar CT image reconstructions and MIPs were obtained to evaluate the vascular anatomy. Carotid stenosis measurements (when applicable) are obtained utilizing NASCET criteria, using the distal internal carotid diameter as the denominator. Multiphase CT imaging of the brain was performed following IV bolus contrast injection. Subsequent parametric perfusion maps were calculated using RAPID software. CONTRAST:  100 mL Omnipaque 350 COMPARISON:  CT head 09/24/2019, MRI head 06/05/2020 FINDINGS: CT HEAD FINDINGS Brain: There is no acute intracranial hemorrhage or mass effect. There is a small area of hypoattenuation with loss of gray-white differentiation in the right frontal lobe involving the precentral gyrus. Additional patchy hypoattenuation in the  supratentorial white matter is nonspecific but may reflect mild to moderate chronic microvascular ischemic changes. Prominence of the ventricles and sulci reflects generalized parenchymal volume loss. 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 Vascular: No hyperdense vessel. Skull: Unremarkable. Sinuses/Orbits: Retained secretions within the right sphenoid. Patchy ethmoid mucosal thickening. No acute orbital finding. Other: Mastoid air cells are clear. ASPECTS [facility] Stroke Program Early CT Score) - Ganglionic level infarction (caudate, lentiform nuclei, internal capsule, insula, M1-M3 cortex): 7 - Supraganglionic infarction (M4-M6 cortex): 2 Total score (0-10 with 10 being normal): 9 Review of the MIP images confirms the above findings CTA NECK FINDINGS Aortic arch: Calcified and noncalcified plaque along the arch and great vessel origins. 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 Right carotid system: Patent. Multifocal calcified and noncalcified plaque along the common carotid with less than 50% stenosis. Mixed plaque is present at the ICA origin causing approximately 60% stenosis. Left carotid system: Patent. Calcified and noncalcified plaque along the common carotid causing less than 50% stenosis. Mixed plaque at the ICA origin causing approximately 50% stenosis. There is subsequent additional mixed plaque causing at least 80% stenosis. Vertebral arteries: Patent. Right vertebral artery is dominant. Mixed plaque along the left V2 segment causing moderate stenosis. Skeleton: Degenerative changes of the cervical spine. Other neck: Heterogeneous, enlarged left thyroid, which has been previously evaluated by ultrasound. Upper chest: Small bilateral pleural effusions. Interlobular septal thickening and patchy ground-glass density, which may reflect edema. Review of the MIP images confirms the above findings CTA HEAD FINDINGS Anterior  circulation: Intracranial internal carotid arteries are patent with calcified plaque causing moderate stenosis. Anterior and middle cerebral arteries are patent. Posterior circulation: Intracranial vertebral arteries are patent. There is mixed plaque causing mild stenosis on  the right and marked stenosis on the left. Patent PICA origins. Basilar artery is patent. Posterior cerebral arteries are patent. Venous sinuses: As permitted by contrast timing, patent. Review of the MIP images confirms the above findings CT Brain Perfusion Findings: CBF (<30%) Volume: 26mL Perfusion (Tmax>6.0s) volume: 73mL Mismatch Volume: 4mL Infarction Location: None IMPRESSION: No acute intracranial hemorrhage. Suspected small acute infarction involving the right precentral gyrus (ASPECT score 9). No large vessel occlusion. No evidence of core infarction or territory at risk by perfusion imaging. Plaque along common and internal carotid arteries bilaterally. Approximately 60% stenosis at the right ICA origin. Approximately 50% stenosis at the left ICA origin with subsequent plaque causing 80% stenosis. Moderate stenosis of the intracranial internal carotid arteries. 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 Partially imaged small bilateral pleural effusions and suspected mild pulmonary edema. Initial results were communicated to Dr. Leonel Ramsay at 12:35 pmon 8/18/2021by text page via the [facility] messaging system. Electronically Signed   By: Macy Mis M.D.   On: 06/11/2020 13:02   CT ANGIO HEAD CODE STROKE  Result Date: 06/11/2020 CLINICAL DATA:  Left-sided weakness EXAM: CT HEAD CODE STROKE CT ANGIOGRAPHY HEAD AND NECK CT PERFUSION BRAIN TECHNIQUE: Multidetector CT imaging of the head and neck was performed using the standard protocol during bolus administration of intravenous contrast. Multiplanar CT image reconstructions and MIPs were obtained to evaluate the vascular anatomy. Carotid stenosis measurements (when  applicable) are obtained utilizing NASCET criteria, using the distal internal carotid diameter as the denominator. Multiphase CT imaging of the brain was performed following IV bolus contrast injection. Subsequent parametric perfusion maps were calculated using RAPID software. CONTRAST:  100 mL Omnipaque 350 COMPARISON:  CT head 09/24/2019, MRI head 06/05/2020 FINDINGS: CT HEAD FINDINGS Brain: There is no acute intracranial hemorrhage or mass effect. There is a small area of hypoattenuation with loss of gray-white differentiation in the right frontal lobe involving the precentral gyrus. Additional patchy hypoattenuation in the supratentorial white matter is nonspecific but may reflect mild to moderate chronic microvascular ischemic changes. Prominence of the ventricles and sulci reflects generalized parenchymal volume loss. 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 Vascular: No hyperdense vessel. Skull: Unremarkable. Sinuses/Orbits: Retained secretions within the right sphenoid. Patchy ethmoid mucosal thickening. No acute orbital finding. Other: Mastoid air cells are clear. ASPECTS [facility] Stroke Program Early CT Score) - Ganglionic level infarction (caudate, lentiform nuclei, internal capsule, insula, M1-M3 cortex): 7 - Supraganglionic infarction (M4-M6 cortex): 2 Total score (0-10 with 10 being normal): 9 Review of the MIP images confirms the above findings CTA NECK FINDINGS Aortic arch: Calcified and noncalcified plaque along the arch and great vessel origins. 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 Right carotid system: Patent. Multifocal calcified and noncalcified plaque along the common carotid with less than 50% stenosis. Mixed plaque is present at the ICA origin causing approximately 60% stenosis. Left carotid system: Patent. Calcified and noncalcified plaque along the common carotid causing less than 50% stenosis. Mixed plaque  at the ICA origin causing approximately 50% stenosis. There is subsequent additional mixed plaque causing at least 80% stenosis. Vertebral arteries: Patent. Right vertebral artery is dominant. Mixed plaque along the left V2 segment causing moderate stenosis. Skeleton: Degenerative changes of the cervical spine. Other neck: Heterogeneous, enlarged left thyroid, which has been previously evaluated by ultrasound. Upper chest: Small bilateral pleural effusions. Interlobular septal thickening and patchy ground-glass density, which may reflect edema. Review of the MIP  images confirms the above findings CTA HEAD FINDINGS Anterior circulation: Intracranial internal carotid arteries are patent with calcified plaque causing moderate stenosis. Anterior and middle cerebral arteries are patent. Posterior circulation: Intracranial vertebral arteries are patent. There is mixed plaque causing mild stenosis on the right and marked stenosis on the left. Patent PICA origins. Basilar artery is patent. Posterior cerebral arteries are patent. Venous sinuses: As permitted by contrast timing, patent. Review of the MIP images confirms the above findings CT Brain Perfusion Findings: CBF (<30%) Volume: 60mL Perfusion (Tmax>6.0s) volume: 67mL Mismatch Volume: 2mL Infarction Location: None IMPRESSION: No acute intracranial hemorrhage. Suspected small acute infarction involving the right precentral gyrus (ASPECT score 9). No large vessel occlusion. No evidence of core infarction or territory at risk by perfusion imaging. Plaque along common and internal carotid arteries bilaterally. Approximately 60% stenosis at the right ICA origin. Approximately 50% stenosis at the left ICA origin with subsequent plaque causing 80% stenosis. Moderate stenosis of the intracranial internal carotid arteries. 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 Partially imaged small bilateral pleural effusions and suspected mild pulmonary edema. Initial  results were communicated to Dr. Leonel Ramsay at 12:35 pmon 8/18/2021by text page via the [facility] messaging system. Electronically Signed   By: Macy Mis M.D.   On: 06/11/2020 13:02   CT ANGIO NECK CODE STROKE  Result Date: 06/11/2020 CLINICAL DATA:  Left-sided weakness EXAM: CT HEAD CODE STROKE CT ANGIOGRAPHY HEAD AND NECK CT PERFUSION BRAIN TECHNIQUE: Multidetector CT imaging of the head and neck was performed using the standard protocol during bolus administration of intravenous contrast. Multiplanar CT image reconstructions and MIPs were obtained to evaluate the vascular anatomy. Carotid stenosis measurements (when applicable) are obtained utilizing NASCET criteria, using the distal internal carotid diameter as the denominator. Multiphase CT imaging of the brain was performed following IV bolus contrast injection. Subsequent parametric perfusion maps were calculated using RAPID software. CONTRAST:  100 mL Omnipaque 350 COMPARISON:  CT head 09/24/2019, MRI head 06/05/2020 FINDINGS: CT HEAD FINDINGS Brain: There is no acute intracranial hemorrhage or mass effect. There is a small area of hypoattenuation with loss of gray-white differentiation in the right frontal lobe involving the precentral gyrus. Additional patchy hypoattenuation in the supratentorial white matter is nonspecific but may reflect mild to moderate chronic microvascular ischemic changes. Prominence of the ventricles and sulci reflects generalized parenchymal volume loss. 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 Vascular: No hyperdense vessel. Skull: Unremarkable. Sinuses/Orbits: Retained secretions within the right sphenoid. Patchy ethmoid mucosal thickening. No acute orbital finding. Other: Mastoid air cells are clear. ASPECTS [facility] Stroke Program Early CT Score) - Ganglionic level infarction (caudate, lentiform nuclei, internal capsule, insula, M1-M3 cortex): 7 -  Supraganglionic infarction (M4-M6 cortex): 2 Total score (0-10 with 10 being normal): 9 Review of the MIP images confirms the above findings CTA NECK FINDINGS Aortic arch: Calcified and noncalcified plaque along the arch and great vessel origins. 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 Right carotid system: Patent. Multifocal calcified and noncalcified plaque along the common carotid with less than 50% stenosis. Mixed plaque is present at the ICA origin causing approximately 60% stenosis. Left carotid system: Patent. Calcified and noncalcified plaque along the common carotid causing less than 50% stenosis. Mixed plaque at the ICA origin causing approximately 50% stenosis. There is subsequent additional mixed plaque causing at least 80% stenosis. Vertebral arteries: Patent. Right vertebral artery is dominant. Mixed plaque along the left V2 segment  causing moderate stenosis. Skeleton: Degenerative changes of the cervical spine. Other neck: Heterogeneous, enlarged left thyroid, which has been previously evaluated by ultrasound. Upper chest: Small bilateral pleural effusions. Interlobular septal thickening and patchy ground-glass density, which may reflect edema. Review of the MIP images confirms the above findings CTA HEAD FINDINGS Anterior circulation: Intracranial internal carotid arteries are patent with calcified plaque causing moderate stenosis. Anterior and middle cerebral arteries are patent. Posterior circulation: Intracranial vertebral arteries are patent. There is mixed plaque causing mild stenosis on the right and marked stenosis on the left. Patent PICA origins. Basilar artery is patent. Posterior cerebral arteries are patent. Venous sinuses: As permitted by contrast timing, patent. Review of the MIP images confirms the above findings CT Brain Perfusion Findings: CBF (<30%) Volume: 77mL Perfusion (Tmax>6.0s) volume: 57mL Mismatch Volume: 59mL Infarction Location: None IMPRESSION: No acute  intracranial hemorrhage. Suspected small acute infarction involving the right precentral gyrus (ASPECT score 9). No large vessel occlusion. No evidence of core infarction or territory at risk by perfusion imaging. Plaque along common and internal carotid arteries bilaterally. Approximately 60% stenosis at the right ICA origin. Approximately 50% stenosis at the left ICA origin with subsequent plaque causing 80% stenosis. Moderate stenosis of the intracranial internal carotid arteries. 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 Partially imaged small bilateral pleural effusions and suspected mild pulmonary edema. Initial results were communicated to Dr. Leonel Ramsay at 12:35 pmon 8/18/2021by text page via the [facility] messaging system. Electronically Signed   By: Macy Mis M.D.   On: 06/11/2020 13:02    Procedures Procedures (including critical care time)  Medications Ordered in ED Medications  sodium chloride flush (NS) 0.9 % injection 3 mL (3 mLs Intravenous Given 06/11/20 1304)  iohexol (OMNIPAQUE) 350 MG/ML injection 100 mL (100 mLs Intravenous Contrast Given 06/11/20 1323)    ED Course  I have reviewed the triage vital signs and the nursing notes.  Pertinent labs & imaging results that were available during my care of the patient were reviewed by me and considered in my medical decision making (see chart for details).    MDM Rules/Calculators/A&P                          79 yo F here as a code stroke. Airway cleared taken urgently back to CT.   CT scan with possible acute stroke in the gyrus.  Not a TPA candidate.  Neurology recommending medical admission.  Family is at bedside and provides some further history.  States she had a fall a few days ago.  No reported injury in the fall.  They think what ever occurred may have started then.  She states that her trouble breathing she thinks is due to her smoking in the remote past.  Going on a few days as well.  I wonder if this  is an aspiration event if she the patient has had a stroke.  Chest x-ray viewed by me with bilateral lower infiltrates.  She has an echo in the system with a preserved EF.  My exam with coarse breath sounds in all fields.  Will discuss with the hospitalist.   The patients results and plan were reviewed and discussed.   Any x-rays performed were independently reviewed by myself.   Differential diagnosis were considered with the presenting HPI.  Medications  sodium chloride flush (NS) 0.9 % injection 3 mL (3 mLs Intravenous Given 06/11/20 1304)  iohexol (OMNIPAQUE) 350 MG/ML injection 100  mL (100 mLs Intravenous Contrast Given 06/11/20 1323)    Vitals:   06/11/20 1251 06/11/20 1254 06/11/20 1259 06/11/20 1331  BP:    (!) 134/94  Pulse:    99  Resp:    (!) 22  Temp:   98.4 F (36.9 C)   TempSrc:   Oral   SpO2: 99%   100%  Weight:  90.7 kg    Height:  5\' 2"  (1.575 m)      Final diagnoses:  Stroke, acute, embolic (HCC)  SOB (shortness of breath)    Admission/ observation were discussed with the admitting physician, patient and/or family and they are comfortable with the plan.    Final Clinical Impression(s) / ED Diagnoses Final diagnoses:  Stroke, acute, embolic (HCC)  SOB (shortness of breath)    Rx / DC Orders ED Discharge Orders    None       Deno Etienne, DO 06/11/20 1446

## 2020-06-11 NOTE — ED Notes (Signed)
Failed Stroke Swallow Screen

## 2020-06-11 NOTE — Progress Notes (Signed)
Patient arrived in the unit at 2050 pm, alert and confused, initiated Telemetry monitor, resting in a bed, and will continue to monitor.

## 2020-06-11 NOTE — H&P (Addendum)
[facility] Admission History and Physical Service Pager: [phone]  Patient name: Cheryl Summers Medical record number: [id number] Date of birth: [birth year] Age: 79 y.o. Gender: female  Primary Care Provider: Nicholos Johns, MD Consultants: Neuro Code Status: DNR Preferred Emergency Contact: Daughter/[name] Conley Rolls [phone] (POA) Son/Donald Cipriano Bunker [phone]  Chief Complaint: Weakness  Assessment and Plan: Cheryl Summers is a 79 y.o. female presenting with unilateral left-sided weakness found to have CVA, outside of tPA window. PMH is significant for mild dementia, CAD s/p CABG, T2DM, HTN, HLD, paroxysmal A. Fib, COPD, HFpEF.  CVA With evidence of right precentral gyrus infarct on imaging and exam.  Patient initially presented to the ED as a code stroke in setting of acute onset left-sided weakness.  Last known well the night of 8/17, son went to her house to take her to a doctor's appointment around 10:30 AM on 8/18 when he found her slumped on the couch and unable to move her left arm.  Patient was outside of the window for tPA at time of presentation to ED. NIHSS 9 on presentation. CT head without contrast showed no evidence of intracranial hemorrhage.  CTA head and neck showed evidence of small acute infarct of the right precentral gyrus.  Exam with left-sided weakness, UE > LE, with mild facial droop, consistent with radiographic findings.   -Neurology following, appreciate involvement.  - admit to med-surg, FPTS, Dr. Andria Frames - MRI - TTE - NPO until passes stroke swallow screen - ASA 325 mg daily, increase atorvastatin 80 mg once able to take PO - permissive HTN up to 220/120 per neuro - cardiac monitoring - frequent neuro checks - AM HbA1c, lipid panel - PT/OT - SLP - vitals per unit routine  Dyspnea in setting of COPD Son reports patient has some dyspnea at baseline; however, he believes her breathing is more labored than usual.  Initially  presented tachypneic with RR 30, not requiring supplemental oxygen.  Diffuse wheezing on exam audible without stethoscope.  Patient with history of COPD and CHF, so could represent exacerbation of either.  Unclear if wheeze of COPD or cardiac wheeze of CHF. Home meds: albuterol inhaler prn, albuterol neb prn, Symbicort, montelukast, torsemide.  - holding torsemide until able to take PO - BNP - Dulera (formulary) - Duoneb sch - albuterol neb prn - holding montelukast until able to take PO - holding albuterol inhaler  HFpEF Followed by cardiology.  Recently reduced torsemide dose due to elevated Cr. Last echo 09/24/19 with EF 60-65%, normal LV/RV function, mild aortic valve sclerosis without stenosis. Home meds: torsemide 20 mg MWF - holding torsemide until able to take PO   HTN Home meds: telmisartan, amlodipine - holding home meds, permissive HTN - should resume clonidine when able due to concern for rebound HTN  Recent Falls  Weakness Patient with history of falls, thought to be multifactorial including gait disturbances and neuropathy.  At baseline ambulates with a cane.  Followed by Dr. Felecia Shelling. - PT/OT  ?LUE DVT Apparently unable to check blood pressure in the right arm due to "a small blockage on the left arm".  Unclear if LUE DVT or PAD. Patient not currently on anticoagulation due to frequent falls.  -patient on DVT ppx dose of Hep  -will consider LUE ultrasound to confirm DVT   CKD Apparently creatinine of 2.7 at cardiologist office 2 weeks ago.  Patient's creatinine on admission 2.07, GFR 22.  Unclear what baseline is, but improved from previous reading 2 weeks  prior to admission.  Likely CKD in the setting of T2DM and chronic HTN. - renally adjust medications - avoid nephrotoxic meds  - monitor I/Os   Mild Dementia/Memory Loss  Last seen/evaluated by neurology February 2021.  Advised her to continue Aricept and Namenda.  -Holding home meds until able to take  p.o.  Meningioma As appreciated on recent MRI 8/12, appears to be stable.  Seen by neurology 8/16.   T2DM Most recent A1c 5.4%. Glucose 90 on admission.  Appears to be well controlled. Home meds: [place].  Afib Patient with history of atrial fibrillation.  She is not anticoagulated due to gait dysfunction, high risk of falls. -Cardiac monitoring   CAD History of CABG in 2012.  Stable, no chest pain.  HLD: Home meds: atorvastatin, Zetia.  Patient's most recent lipid panel from 05/26/2020 showed normal values. -Can restart statin and Zetia once patient no longer n.p.o.  Lumbago: Patient with history of low back pain, takes Lyrica.  GERD Taking omeprazole at home. -Holding home meds until able to take p.o.  FEN/GI: NPO until passes stroke swallow screen Prophylaxis: [facility]  Disposition: med-surg  History of Present Illness:  Cheryl Summers is a 79 y.o. female presenting with left-sided extremity weakness.  Patient lives alone and normally has her daughter visit daily. Patient's son gives history for this encounter as patient has dementia at baseline. He went to her house this morning around 10:30 AM to take her to a doctor's appointment; however, he found her slumped on the couch unable to get up. Son also noticed that she was not able to use her left arm.  Son states she was last known normal last night.  Son had talked to her on the phone last night, and everything was normal.    Son reports patient has blood clot in left arm and asked to not check blood pressure in left arm. Patient's son is concerned that she is having shortness of breath worse than baseline. Patient sees several physicians in [place].  Denies any dysarthria. Family has seen her normal on camera   Review Of Systems: Per HPI with the following additions:   Review of Systems  Neurological: Positive for facial asymmetry and weakness. Negative for seizures, numbness and headaches.     Patient Active Problem List    Diagnosis Date Noted   CVA (cerebral vascular accident) ([place]) 06/11/2020   Numbness 12/03/2019   Memory loss 12/03/2019   Meningioma ([place]) 12/03/2019   Gait disturbance 12/03/2019   Diabetic polyneuropathy associated with type 2 diabetes mellitus ([place]) 12/03/2019   Dizziness 09/18/2019   Frequent falls 09/18/2019   Renal insufficiency 06/07/2018   Hypotension    Spinal stenosis, lumbar region with neurogenic claudication 06/06/2018    Class: Acute   Spondylolisthesis, lumbar region 06/06/2018    Class: Chronic   Status post lumbar spinal fusion 06/06/2018   Murmur, heart 02/24/2018   Bradycardia 01/19/2018   Coronary artery disease involving native coronary artery of native heart with angina pectoris ([place]) 05/29/2017   Tobacco abuse 05/13/2017   Hypertension 05/13/2017   Hyperlipidemia 05/13/2017   GERD (gastroesophageal reflux disease) 05/13/2017   COPD (chronic obstructive pulmonary disease) ([place]) 05/13/2017   Carotid artery stenosis 05/13/2017   Chronic diastolic heart failure ([place]) 06/09/2015   Hypertensive heart disease with heart failure ([place]) 06/09/2015   PAF (paroxysmal atrial fibrillation) ([place]) 06/09/2015    Past Medical History: Past Medical History:  Diagnosis Date   Anxiety    Carotid artery stenosis,  asymptomatic    50% ASYMPTOMATIC  RIGHT CAROTID STENOSIS   Chronic diastolic heart failure ([place]) 06/09/2015   COPD (chronic obstructive pulmonary disease) (HCC)    Depression    Diabetes mellitus without complication (HCC)    Dysrhythmia    Gastroesophageal reflux    Hyperlipidemia    Hypertension    Hypertensive heart disease with heart failure ([place]) 06/09/2015   PAF (paroxysmal atrial fibrillation) ([place]) 06/09/2015   Overview:  CHADS2 vasc score=6   Renal insufficiency    Tobacco abuse    Tremors of nervous system     Past Surgical History: Past Surgical History:  Procedure Laterality Date   ABDOMINAL HYSTERECTOMY     CATARACT EXTRACTION  2016    CORONARY ARTERY BYPASS GRAFT  2012   NOSE SURGERY      Social History: Social History   Tobacco Use   Smoking status: Former Smoker   Smokeless tobacco: Never Used  Scientific laboratory technician Use: Never used  Substance Use Topics   Alcohol use: No   Drug use: No   Additional social history: stopped twenty years ago, no alcohol or recreational drug use   Please also refer to relevant sections of EMR.  Family History: Family History  Problem Relation Age of Onset   Heart attack Mother    Hypertension Mother    Hyperlipidemia Mother    Heart attack Father    Hypertension Father    Hyperlipidemia Father    Heart attack Sister    Stroke Brother    Heart attack Brother    Hypertension Daughter     Allergies and Medications: No Known Allergies Current Facility-Administered Medications on File Prior to Encounter  Medication Dose Route Frequency Provider Last Rate Last Admin   betamethasone acetate-betamethasone sodium phosphate (CELESTONE) injection 12 mg  12 mg Other Once Magnus Sinning, MD       Current Outpatient Medications on File Prior to Encounter  Medication Sig Dispense Refill   acetaminophen (TYLENOL) 500 MG tablet Take 500-1,000 mg by mouth every 6 (six) hours as needed for mild pain or headache.     albuterol (PROVENTIL HFA;VENTOLIN HFA) 108 (90 Base) MCG/ACT inhaler Inhale 2 puffs into the lungs every 6 (six) hours as needed for wheezing or shortness of breath.     albuterol (PROVENTIL) (2.5 MG/3ML) 0.083% nebulizer solution Take 2.5 mg by nebulization daily as needed for wheezing or shortness of breath.     allopurinol (ZYLOPRIM) 100 MG tablet Take 100 mg by mouth daily as needed (gout).      amLODipine (NORVASC) 5 MG tablet Take 5 mg by mouth daily.     Artificial Tear Solution (SOOTHE XP) SOLN Place 1 drop into both eyes daily as needed (dry eyes).     aspirin EC 81 MG tablet Take 81 mg by mouth daily.     atorvastatin (LIPITOR) 20 MG tablet Take 20 mg by mouth at  bedtime.      budesonide-formoterol (SYMBICORT) 160-4.5 MCG/ACT inhaler Inhale 2 puffs into the lungs 2 (two) times daily as needed (shortness of breath).      buPROPion (WELLBUTRIN XL) 150 MG 24 hr tablet Take 150 mg by mouth at bedtime.      cloNIDine (CATAPRES) 0.2 MG tablet Take 0.2 mg by mouth in the morning and at bedtime.     donepezil (ARICEPT) 10 MG tablet Take 10 mg by mouth daily.      ezetimibe (ZETIA) 10 MG tablet Take 5 mg by mouth  at bedtime.      FARXIGA 10 MG TABS tablet Take 5 mg by mouth daily.     fluticasone (FLONASE) 50 MCG/ACT nasal spray Place 1-2 sprays into both nostrils as needed for allergies.      memantine (NAMENDA XR) 28 MG CP24 24 hr capsule Take 1 capsule by mouth daily.     montelukast (SINGULAIR) 10 MG tablet Take 10 mg by mouth at bedtime.      Multiple Vitamin (MULTIVITAMIN) tablet Take 1 tablet by mouth daily.     nystatin-triamcinolone (MYCOLOG II) cream Apply 1 application topically daily as needed (skin bumps).     omeprazole (PRILOSEC) 20 MG capsule Take 20 mg by mouth daily.      polyethylene glycol (MIRALAX) packet Take 17 g by mouth daily. (Patient taking differently: Take 17 g by mouth daily as needed for mild constipation. ) 14 each 0   pregabalin (LYRICA) 75 MG capsule Take 75 mg by mouth 2 (two) times daily.     sertraline (ZOLOFT) 50 MG tablet Take 50 mg by mouth at bedtime.      sodium chloride (OCEAN) 0.65 % SOLN nasal spray Place 1 spray into both nostrils as needed for congestion.     telmisartan (MICARDIS) 80 MG tablet Take 80 mg by mouth daily.     tiZANidine (ZANAFLEX) 2 MG tablet Take 2 mg by mouth at bedtime.      torsemide (DEMADEX) 20 MG tablet Take 20 mg by mouth every Monday, Wednesday, and Friday.       Objective: BP (!) 134/94   Pulse 96   Temp 98.4 F (36.9 C) (Oral)   Resp 17   Ht 5\' 2"  (1.575 m)   Wt 90.7 kg   SpO2 100%   BMI 36.58 kg/m  Exam: General: Alert, elderly woman lying in bed, NAD Eyes: PERRL, EOMI ENTM:  MMM Neck: supple Cardiovascular: RRR, no murmurs Respiratory: Diffuse wheezes audible without stethoscope, breathing comfortable on room air Gastrointestinal: soft, non-tender, +BS Derm: no rash Neuro: Alert and oriented to self and place (unable to state year), mild left facial droop, 5/5 strength right upper and lower extremities, 3/5 strength left bicep, 4/5 strength left tricep, 4/5 left grip strength, 4/5 left hip flexors, wiggles toes, sensation intact bilateral upper and lower extremities Psych: affect normal   Labs and Imaging: CBC BMET  Recent Labs  Lab 06/11/20 1211  WBC 19.0*  HGB 13.0  13.3  HCT 42.7  39.0  PLT 237   Recent Labs  Lab 06/11/20 1211  NA 145  144  K 4.6  4.3  CL 111  113*  CO2 18*  BUN 28*  31*  CREATININE 1.89*  1.80*  GLUCOSE 112*  112*  CALCIUM 9.5     EKG:  normal rate, rhythm appears regular, but no discernible p-waves suggesting A Fib   Zola Button, MD 06/11/2020, 4:38 PM PGY-1, [place] Intern pager: [phone], text pages welcome   FPTS Upper-Level Resident Addendum   I have independently interviewed and examined the patient. I have discussed the above with the original author and agree with their documentation. My edits for correction/addition/clarification are in blue. Please see also any attending notes.   Eulis Foster, MD PGY-2, [place] Medicine 06/11/2020 10:44 PM  [place] Service pager: [phone] (text pages welcome through [place])

## 2020-06-11 NOTE — Consult Note (Addendum)
Neurology Consultation  Reason for Consult: code stroke Referring Physician: Dr. Tyrone Nine  CC: left sided weakness  History is obtained from:EMS   HPI: Dallis Darden Oatley is a 79 y.o. female with history of mild dementia, tobacco abuse, hyperlipidemia, hypertension, paroxysmal atrial fibrillation, diabetes and carotid artery disease.  Patient was last seen normal at 2100 hrs. on 06/10/2020.  This morning the son checked up on her and noted that she was significantly short of breath and had left arm paralysis.  EMS was called out.  Patient was brought to [facility] as code stroke.  Upon arrival to the emergency department patient showed a left facial palsy, inability to move her left arm and weakness in her left leg.  She was also showing dysarthria and some difficulty with naming.  As patient was out of the window she was not a TPA candidate and brought to CT for CTA of head and neck along with perfusion.  Of note: Patient is followed by Dr. Felecia Shelling as outpatient for frequent falls and memory loss.  LKW: 2100 06/10/2020 tpa given?: no, out of window Premorbid modified Rankin scale (mRS): 0  NIHSS 1a Level of Conscious.: 0 1b LOC Questions: 0 1c LOC Commands: 0 2 Best Gaze: 0 3 Visual: 0 4 Facial Palsy: 1 5a Motor Arm - left: 4 5b Motor Arm - Right: 0 6a Motor Leg - Left: 2 6b Motor Leg - Right: 0 7 Limb Ataxia: 0 8 Sensory: 0 9 Best Language: 1 10 Dysarthria: 1 11 Extinct. and Inatten.: 0 TOTAL: 9   Past Medical History:  Diagnosis Date  . Anxiety   . Carotid artery stenosis, asymptomatic    50% ASYMPTOMATIC  RIGHT CAROTID STENOSIS  . Chronic diastolic heart failure ([place]) 06/09/2015  . COPD (chronic obstructive pulmonary disease) ([place])   . Depression   . Diabetes mellitus without complication ([place])   . Dysrhythmia   . Gastroesophageal reflux   . Hyperlipidemia   . Hypertension   . Hypertensive heart disease with heart failure ([place]) 06/09/2015  . PAF (paroxysmal atrial  fibrillation) ([place]) 06/09/2015   Overview:  CHADS2 vasc score=6  . Renal insufficiency   . Tobacco abuse   . Tremors of nervous system      Family History  Problem Relation Age of Onset  . Heart attack Mother   . Hypertension Mother   . Hyperlipidemia Mother   . Heart attack Father   . Hypertension Father   . Hyperlipidemia Father   . Heart attack Sister   . Stroke Brother   . Heart attack Brother   . Hypertension Daughter    Social History:   reports that she has quit smoking. She has never used smokeless tobacco. She reports that she does not drink alcohol and does not use drugs.  Medications  Current Facility-Administered Medications:  .  betamethasone acetate-betamethasone sodium phosphate (CELESTONE) injection 12 mg, 12 mg, Other, Once, Magnus Sinning, MD .  sodium chloride flush (NS) 0.9 % injection 3 mL, 3 mL, Intravenous, Once, Deno Etienne, DO  Current Outpatient Medications:  .  albuterol (PROVENTIL HFA;VENTOLIN HFA) 108 (90 Base) MCG/ACT inhaler, Inhale 2 puffs into the lungs every 6 (six) hours as needed for wheezing or shortness of breath., Disp: , Rfl:  .  albuterol (PROVENTIL) (2.5 MG/3ML) 0.083% nebulizer solution, Take 2.5 mg by nebulization daily as needed for wheezing or shortness of breath., Disp: , Rfl:  .  allopurinol (ZYLOPRIM) 100 MG tablet, Take 100 mg by mouth daily  as needed., Disp: , Rfl:  .  amLODipine (NORVASC) 5 MG tablet, Take 5 mg by mouth daily., Disp: , Rfl:  .  Artificial Tear Solution (SOOTHE XP) SOLN, Place 1 drop into both eyes daily as needed (dry eyes)., Disp: , Rfl:  .  aspirin EC 81 MG tablet, Take 81 mg by mouth daily., Disp: , Rfl:  .  atorvastatin (LIPITOR) 20 MG tablet, Take 20 mg by mouth at bedtime. , Disp: , Rfl:  .  budesonide-formoterol (SYMBICORT) 160-4.5 MCG/ACT inhaler, Inhale 2 puffs into the lungs 2 (two) times daily as needed (shortness of breath). , Disp: , Rfl:  .  buPROPion (WELLBUTRIN XL) 150 MG 24 hr tablet, Take 150  mg by mouth at bedtime. , Disp: , Rfl:  .  cloNIDine (CATAPRES) 0.2 MG tablet, Take 0.2 mg by mouth in the morning and at bedtime., Disp: , Rfl:  .  donepezil (ARICEPT) 10 MG tablet, Take 10 mg by mouth at bedtime., Disp: , Rfl:  .  ezetimibe (ZETIA) 10 MG tablet, Take 5 mg by mouth at bedtime. , Disp: , Rfl:  .  FARXIGA 10 MG TABS tablet, Take 5 mg by mouth daily., Disp: , Rfl:  .  fluticasone (FLONASE) 50 MCG/ACT nasal spray, as needed., Disp: , Rfl:  .  memantine (NAMENDA XR) 28 MG CP24 24 hr capsule, Take 1 capsule by mouth daily., Disp: , Rfl:  .  montelukast (SINGULAIR) 10 MG tablet, Take 10 mg by mouth at bedtime. , Disp: , Rfl:  .  Multiple Vitamin (MULTIVITAMIN) tablet, Take 1 tablet by mouth daily., Disp: , Rfl:  .  nystatin-triamcinolone (MYCOLOG II) cream, Apply 1 application topically daily as needed (skin bumps)., Disp: , Rfl:  .  omeprazole (PRILOSEC) 20 MG capsule, Take 20 mg by mouth daily as needed (acid reflux). , Disp: , Rfl:  .  polyethylene glycol (MIRALAX) packet, Take 17 g by mouth daily., Disp: 14 each, Rfl: 0 .  pregabalin (LYRICA) 75 MG capsule, Take 75 mg by mouth 2 (two) times daily., Disp: , Rfl:  .  sertraline (ZOLOFT) 50 MG tablet, Take 50 mg by mouth at bedtime. , Disp: , Rfl:  .  sodium chloride (OCEAN) 0.65 % SOLN nasal spray, Place 1 spray into both nostrils as needed for congestion., Disp: , Rfl:  .  telmisartan (MICARDIS) 80 MG tablet, Take 80 mg by mouth daily., Disp: , Rfl:  .  tiZANidine (ZANAFLEX) 2 MG tablet, Take 2 mg by mouth at bedtime. , Disp: , Rfl:  .  torsemide (DEMADEX) 20 MG tablet, Take 20 mg by mouth 3 (three) times a week., Disp: , Rfl:   ROS:   Unable to obtain due to confusion.    Exam: Current vital signs: BP (!) 150/75 (BP Location: Right Arm)   Pulse 97   Resp (!) 30   SpO2 99%  Vital signs in last 24 hours: Pulse Rate:  [97] 97 (08/18 1230) Resp:  [30] 30 (08/18 1230) BP: (150)/(75) 150/75 (08/18 1230) SpO2:  [99 %] 99 %  (08/18 1251)   Constitutional: Appears well-developed and well-nourished.  Eyes: No scleral injection HENT: No OP obstrucion Head: Normocephalic.  Cardiovascular: Normal rate and regular rhythm.  Respiratory: Labored breathing GI: Soft.  No distension. There is no tenderness.  Skin: WDI  Neuro: Mental Status: Patient is awake however she is unable to state her age but did get the month correct.  She is able to follow commands.  She shows mild  aphasia mostly with naming and mild dysarthria. Cranial Nerves: II: Visual Fields are full.  III,IV, VI: EOMI without ptosis or diploplia. Pupils equal, round and reactive to light V: Facial sensation is symmetric to temperature VII: Slight left facial droop VIII: hearing is intact to voice XI: Shoulder shrug is symmetric. XII: tongue is midline without atrophy or fasciculations.  Motor: No movement in her left arm/flaccid and unable to resist gravity with left leg.  Right arm and leg showed antigravity movement without difficulty Sensory: Sensation is symmetric to light touch and temperature in the arms and legs. Deep Tendon Reflexes: 2+ and symmetric in the biceps and patellae.  Plantars: Toes are downgoing bilaterally.  Cerebellar: FNF and HKS are intact bilaterally  Labs I have reviewed labs in epic and the results pertinent to this consultation are:   CBC    Component Value Date/Time   WBC 19.0 (H) 06/11/2020 1211   RBC 4.43 06/11/2020 1211   HGB 13.0 06/11/2020 1211   HGB 13.3 06/11/2020 1211   HCT 42.7 06/11/2020 1211   HCT 39.0 06/11/2020 1211   PLT 237 06/11/2020 1211   MCV 96.4 06/11/2020 1211   MCH 29.3 06/11/2020 1211   MCHC 30.4 06/11/2020 1211   RDW 14.2 06/11/2020 1211   LYMPHSABS 0.5 (L) 06/11/2020 1211   MONOABS 1.9 (H) 06/11/2020 1211   EOSABS 0.0 06/11/2020 1211   BASOSABS 0.0 06/11/2020 1211    CMP     Component Value Date/Time   NA 144 06/11/2020 1211   NA 138 05/26/2020 1540   K 4.3 06/11/2020  1211   CL 113 (H) 06/11/2020 1211   CO2 25 05/26/2020 1540   GLUCOSE 112 (H) 06/11/2020 1211   BUN 31 (H) 06/11/2020 1211   BUN 30 (H) 05/26/2020 1540   CREATININE 1.80 (H) 06/11/2020 1211   CALCIUM 9.4 05/26/2020 1540   PROT 6.7 05/26/2020 1540   ALBUMIN 4.3 05/26/2020 1540   AST 25 05/26/2020 1540   ALT 21 05/26/2020 1540   ALKPHOS 112 05/26/2020 1540   BILITOT 0.3 05/26/2020 1540   GFRNONAA 22 (L) 05/26/2020 1540   GFRAA 25 (L) 05/26/2020 1540    Lipid Panel     Component Value Date/Time   CHOL 121 05/26/2020 1540   TRIG 121 05/26/2020 1540   HDL 51 05/26/2020 1540   CHOLHDL 2.4 05/26/2020 1540   LDLCALC 48 05/26/2020 1540     Imaging I have reviewed the images obtained:  CT/CTA head and neck/CT perfusion-scan of the brain-no acute intracranial hemorrhage.  Suspected small acute infarction involving the right precentral gyrus.  No large vessel occlusion.  No evidence of cord infarction or territory at risk of perfusion imaging.  Plaque along common and internal carotid arteries bilaterally.  Approximately 60% stenosis in the right ICA origin.  Approximately 50% stenosis at the left ICA origin with subsequent plaque causing 80% stenosis.  Moderate stenosis of the intracranial internal carotid arteries.  Severe stenosis in the intracranial left vertebral artery.   Etta Quill PA-C [facility] [phone]  M-F  (9:00 am- 5:00 PM)  06/11/2020, 12:53 PM   I have seen the patient and reviewed the above note.  Assessment:  This is a 79 year old female presenting to [facility] secondary to code stroke with left-sided weakness.  She is not a TPA candidate due to being outside the window and her CTA does not show any large vessel occlusion.  There is a suspected precentral gyrus infarct on the right which  is consistent with her symptoms.  Impression: -Left upper extremity plegia and lower extremity paresis likely secondary to stroke  Recommend -MRI of  the brain without contrast  -Transthoracic Echo,  -Start patient on ASA 325mg  daily  -Increase atorvastatin 80 mg -BP goal: permissive HTN upto 220/120 mmHg -HBAIC and Lipid profile -Telemetry monitoring -Frequent neuro checks -NPO until passes stroke swallow screen -PT/OT # please page stroke NP  Or  PA  Or MD from 8am -4 pm  as this patient from this time will be  followed by the stroke.   You can look them up on [email or web address]  Password [email or web address]  Roland Rack, MD [facility] [phone]  If 7pm- 7am, please page neurology on call as listed in [place].

## 2020-06-11 NOTE — ED Triage Notes (Signed)
Pt arrived from home via [facility]. LKW 2100 8/18. Pt observed by family slouched over on couch at approx. 1100. EMS vitals 151/76, 98% on 15L NR, CBG 149. EMS gave 2 nebs and 2 albuterol.

## 2020-06-11 NOTE — Code Documentation (Signed)
Stroke Response Nurse Documentation Code Documentation  Cheryl Summers is a 79 y.o. female arriving to [facility] ED via [facility] on 06/11/2020 with past medical hx of CHF. Code stroke was activated by EMS. Patient from home where she was LKW at 2100 last night and now complaining of left sided weakness. Stroke team at the bedside on patient arrival. Labs drawn and patient cleared for CT by Dr. Sedonia Small. Patient to CT with team. NIHSS 9, see documentation for details and code stroke times. Patient with left facial droop, left arm weakness, left leg weakness, Expressive aphasia  and dysarthria  on exam. The following imaging was completed: CT, CTA head and neck, CTP. Patient is not a candidate for tPA due to being outside tPA window. Care/Plan: Q2 mNIHSS/VS and patient to be admitted for stroke. Bedside handoff with ED RN Gillermina Phy, Kara Mead  Stroke Response RN

## 2020-06-11 NOTE — Progress Notes (Signed)
Phone call note with patient's daughter Ofilia Neas [phone]  Per the daughter: Patient lives by herself. Daughter is on vacation this week at [facility]. The patient went to her cardiologist about 2 weeks ago at [facility] cards (Dr. Bettina Gavia), Cr level was checked and it was 2.7, so cardiologist reduce the amount of torsemide she takes from daily down to 3 times weekly.  This is the only recent medication change the patient has had.     Today the patient's son was going to take the patient to doc's office to have Cr re-checked. When son arrived at the patient's house the patient couldn't get off the couch to let him in.  He found her slumped over the couch. She couldn't move her left arm, foot. Son did not notice any facial droop.  The daughter has cameras in her mother's house that she can check in on her, she last reports seeing her normal around 5-6 PM on 8/17 when she was up and doing laundry.  The daughter also reports that the patient checks her blood pressure at home, but they always check blood pressure on the right arm due to "a small blockage on the left arm".  Baseline function: At baseline patient is oriented to herself, also knows her family, she doesn't always know the year. She has memory loss (frequently forgets appts, items to add to grocery list), per the daughter this is new within the past couple of months and has been progressively getting worse. Her doctors have told her she should not live alone. They have tried home health and son tried to move in with the patient, but that was per the daughter "a total train wreck".   COVID vaccination: she has been vaccinated with [place] 11/10/2019 and February 2021.  Per the patient's daughter the patient is a DNR. A copy of the DNR is with [facility], Dr. Nicholos Johns.     Milus Banister, [place], PGY-3 06/11/2020 3:22 PM

## 2020-06-12 ENCOUNTER — Encounter (HOSPITAL_COMMUNITY): Payer: Self-pay | Admitting: Family Medicine

## 2020-06-12 ENCOUNTER — Observation Stay (HOSPITAL_BASED_OUTPATIENT_CLINIC_OR_DEPARTMENT_OTHER): Payer: PPO

## 2020-06-12 DIAGNOSIS — N1832 Chronic kidney disease, stage 3b: Secondary | ICD-10-CM | POA: Diagnosis present

## 2020-06-12 DIAGNOSIS — I371 Nonrheumatic pulmonary valve insufficiency: Secondary | ICD-10-CM | POA: Diagnosis not present

## 2020-06-12 DIAGNOSIS — E1165 Type 2 diabetes mellitus with hyperglycemia: Secondary | ICD-10-CM | POA: Diagnosis present

## 2020-06-12 DIAGNOSIS — G8194 Hemiplegia, unspecified affecting left nondominant side: Secondary | ICD-10-CM | POA: Diagnosis present

## 2020-06-12 DIAGNOSIS — Z515 Encounter for palliative care: Secondary | ICD-10-CM | POA: Diagnosis not present

## 2020-06-12 DIAGNOSIS — I5181 Takotsubo syndrome: Secondary | ICD-10-CM | POA: Diagnosis present

## 2020-06-12 DIAGNOSIS — Z20822 Contact with and (suspected) exposure to covid-19: Secondary | ICD-10-CM | POA: Diagnosis present

## 2020-06-12 DIAGNOSIS — R062 Wheezing: Secondary | ICD-10-CM | POA: Diagnosis not present

## 2020-06-12 DIAGNOSIS — I361 Nonrheumatic tricuspid (valve) insufficiency: Secondary | ICD-10-CM | POA: Diagnosis not present

## 2020-06-12 DIAGNOSIS — I771 Stricture of artery: Secondary | ICD-10-CM | POA: Diagnosis present

## 2020-06-12 DIAGNOSIS — I10 Essential (primary) hypertension: Secondary | ICD-10-CM | POA: Diagnosis not present

## 2020-06-12 DIAGNOSIS — I5021 Acute systolic (congestive) heart failure: Secondary | ICD-10-CM | POA: Diagnosis not present

## 2020-06-12 DIAGNOSIS — M545 Low back pain: Secondary | ICD-10-CM | POA: Diagnosis present

## 2020-06-12 DIAGNOSIS — I679 Cerebrovascular disease, unspecified: Secondary | ICD-10-CM | POA: Diagnosis not present

## 2020-06-12 DIAGNOSIS — E1159 Type 2 diabetes mellitus with other circulatory complications: Secondary | ICD-10-CM | POA: Diagnosis not present

## 2020-06-12 DIAGNOSIS — R296 Repeated falls: Secondary | ICD-10-CM | POA: Diagnosis present

## 2020-06-12 DIAGNOSIS — R1310 Dysphagia, unspecified: Secondary | ICD-10-CM | POA: Diagnosis present

## 2020-06-12 DIAGNOSIS — Z7189 Other specified counseling: Secondary | ICD-10-CM | POA: Diagnosis not present

## 2020-06-12 DIAGNOSIS — Z66 Do not resuscitate: Secondary | ICD-10-CM | POA: Diagnosis present

## 2020-06-12 DIAGNOSIS — I152 Hypertension secondary to endocrine disorders: Secondary | ICD-10-CM | POA: Diagnosis present

## 2020-06-12 DIAGNOSIS — E1122 Type 2 diabetes mellitus with diabetic chronic kidney disease: Secondary | ICD-10-CM | POA: Diagnosis present

## 2020-06-12 DIAGNOSIS — I110 Hypertensive heart disease with heart failure: Secondary | ICD-10-CM

## 2020-06-12 DIAGNOSIS — D329 Benign neoplasm of meninges, unspecified: Secondary | ICD-10-CM | POA: Diagnosis present

## 2020-06-12 DIAGNOSIS — R0602 Shortness of breath: Secondary | ICD-10-CM | POA: Diagnosis present

## 2020-06-12 DIAGNOSIS — I6330 Cerebral infarction due to thrombosis of unspecified cerebral artery: Secondary | ICD-10-CM

## 2020-06-12 DIAGNOSIS — I340 Nonrheumatic mitral (valve) insufficiency: Secondary | ICD-10-CM

## 2020-06-12 DIAGNOSIS — E785 Hyperlipidemia, unspecified: Secondary | ICD-10-CM | POA: Diagnosis present

## 2020-06-12 DIAGNOSIS — K219 Gastro-esophageal reflux disease without esophagitis: Secondary | ICD-10-CM | POA: Diagnosis present

## 2020-06-12 DIAGNOSIS — I2510 Atherosclerotic heart disease of native coronary artery without angina pectoris: Secondary | ICD-10-CM | POA: Diagnosis present

## 2020-06-12 DIAGNOSIS — E1169 Type 2 diabetes mellitus with other specified complication: Secondary | ICD-10-CM | POA: Diagnosis present

## 2020-06-12 DIAGNOSIS — R29709 NIHSS score 9: Secondary | ICD-10-CM | POA: Diagnosis present

## 2020-06-12 DIAGNOSIS — I480 Paroxysmal atrial fibrillation: Secondary | ICD-10-CM | POA: Diagnosis present

## 2020-06-12 DIAGNOSIS — Z951 Presence of aortocoronary bypass graft: Secondary | ICD-10-CM | POA: Diagnosis not present

## 2020-06-12 DIAGNOSIS — R29810 Facial weakness: Secondary | ICD-10-CM | POA: Diagnosis present

## 2020-06-12 DIAGNOSIS — I639 Cerebral infarction, unspecified: Secondary | ICD-10-CM | POA: Diagnosis not present

## 2020-06-12 DIAGNOSIS — J449 Chronic obstructive pulmonary disease, unspecified: Secondary | ICD-10-CM | POA: Diagnosis present

## 2020-06-12 DIAGNOSIS — I5023 Acute on chronic systolic (congestive) heart failure: Secondary | ICD-10-CM | POA: Diagnosis present

## 2020-06-12 DIAGNOSIS — I63231 Cerebral infarction due to unspecified occlusion or stenosis of right carotid arteries: Secondary | ICD-10-CM | POA: Diagnosis present

## 2020-06-12 DIAGNOSIS — G8190 Hemiplegia, unspecified affecting unspecified side: Secondary | ICD-10-CM

## 2020-06-12 LAB — LIPID PANEL
Cholesterol: 160 mg/dL (ref 0–200)
HDL: 64 mg/dL (ref >40)
LDL Cholesterol: 83 mg/dL (ref 0–99)
Total CHOL/HDL Ratio: 2.5 RATIO
Triglycerides: 66 mg/dL (ref <150)
VLDL: 13 mg/dL (ref 0–40)

## 2020-06-12 LAB — HEMOGLOBIN A1C
Hgb A1c MFr Bld: 5.8 % — ABNORMAL HIGH (ref 4.8–5.6)
Mean Plasma Glucose: 119.76 mg/dL

## 2020-06-12 LAB — BASIC METABOLIC PANEL
Anion gap: 13 (ref 5–15)
BUN: 26 mg/dL — ABNORMAL HIGH (ref 8–23)
CO2: 18 mmol/L — ABNORMAL LOW (ref 22–32)
Calcium: 9.4 mg/dL (ref 8.9–10.3)
Chloride: 113 mmol/L — ABNORMAL HIGH (ref 98–111)
Creatinine, Ser: 1.53 mg/dL — ABNORMAL HIGH (ref 0.44–1.00)
GFR calc Af Amer: 37 mL/min — ABNORMAL LOW (ref >60)
GFR calc non Af Amer: 32 mL/min — ABNORMAL LOW (ref >60)
Glucose, Bld: 191 mg/dL — ABNORMAL HIGH (ref 70–99)
Potassium: 4.1 mmol/L (ref 3.5–5.1)
Sodium: 144 mmol/L (ref 135–145)

## 2020-06-12 LAB — TROPONIN I (HIGH SENSITIVITY)
Troponin I (High Sensitivity): 2943 ng/L (ref <18)
Troponin I (High Sensitivity): 3488 ng/L (ref <18)

## 2020-06-12 LAB — ECHOCARDIOGRAM COMPLETE
Area-P 1/2: 4.07 cm2
Calc EF: 44.9 %
Height: 62 in
Radius: 0.4 cm
S' Lateral: 2.9 cm
Single Plane A2C EF: 54.2 %
Single Plane A4C EF: 32.6 %
Weight: 3054.69 oz

## 2020-06-12 MED ORDER — FUROSEMIDE 10 MG/ML IJ SOLN
80.0000 mg | Freq: Once | INTRAMUSCULAR | Status: AC
  Administered 2020-06-12: 80 mg via INTRAVENOUS
  Filled 2020-06-12: qty 8

## 2020-06-12 MED ORDER — LOSARTAN POTASSIUM 25 MG PO TABS
25.0000 mg | ORAL_TABLET | Freq: Every day | ORAL | Status: DC
  Administered 2020-06-12: 25 mg via ORAL
  Filled 2020-06-12 (×2): qty 1

## 2020-06-12 MED ORDER — METOPROLOL TARTRATE 12.5 MG HALF TABLET: 12.5000 mg | ORAL_TABLET | Freq: Two times a day (BID) | ORAL | Status: DC

## 2020-06-12 MED ORDER — ATORVASTATIN CALCIUM 10 MG PO TABS
20.0000 mg | ORAL_TABLET | Freq: Every day | ORAL | Status: DC
  Filled 2020-06-12: qty 2

## 2020-06-12 MED ORDER — FUROSEMIDE 10 MG/ML IJ SOLN
80.0000 mg | Freq: Two times a day (BID) | INTRAMUSCULAR | Status: DC
  Administered 2020-06-12: 80 mg via INTRAVENOUS
  Filled 2020-06-12: qty 8

## 2020-06-12 MED ORDER — CLOPIDOGREL BISULFATE 75 MG PO TABS
75.0000 mg | ORAL_TABLET | Freq: Every day | ORAL | Status: DC
  Administered 2020-06-12: 75 mg via ORAL
  Filled 2020-06-12 (×2): qty 1

## 2020-06-12 MED ORDER — METOPROLOL SUCCINATE ER 50 MG PO TB24
50.0000 mg | ORAL_TABLET | Freq: Every day | ORAL | Status: DC
  Administered 2020-06-12: 50 mg via ORAL
  Filled 2020-06-12 (×2): qty 1

## 2020-06-12 MED ORDER — PERFLUTREN LIPID MICROSPHERE
1.0000 mL | INTRAVENOUS | Status: AC | PRN
  Administered 2020-06-12: 2 mL via INTRAVENOUS
  Filled 2020-06-12: qty 10

## 2020-06-12 MED ORDER — PANTOPRAZOLE SODIUM 40 MG PO TBEC
40.0000 mg | DELAYED_RELEASE_TABLET | Freq: Every day | ORAL | Status: DC
  Administered 2020-06-12: 40 mg via ORAL
  Filled 2020-06-12 (×2): qty 1

## 2020-06-12 MED ORDER — IPRATROPIUM-ALBUTEROL 0.5-2.5 (3) MG/3ML IN SOLN
3.0000 mL | Freq: Four times a day (QID) | RESPIRATORY_TRACT | Status: DC
  Administered 2020-06-12: 3 mL via RESPIRATORY_TRACT
  Filled 2020-06-12 (×2): qty 3

## 2020-06-12 MED ORDER — ASPIRIN EC 81 MG PO TBEC: 81.0000 mg | DELAYED_RELEASE_TABLET | Freq: Every day | ORAL | Status: DC

## 2020-06-12 MED ORDER — MONTELUKAST SODIUM 10 MG PO TABS
10.0000 mg | ORAL_TABLET | Freq: Every day | ORAL | Status: DC
  Filled 2020-06-12: qty 1

## 2020-06-12 MED ORDER — PREGABALIN 75 MG PO CAPS
75.0000 mg | ORAL_CAPSULE | Freq: Two times a day (BID) | ORAL | Status: DC
  Administered 2020-06-12: 75 mg via ORAL
  Filled 2020-06-12 (×3): qty 1

## 2020-06-12 MED ORDER — PERFLUTREN LIPID MICROSPHERE
INTRAVENOUS | Status: AC
  Filled 2020-06-12: qty 10

## 2020-06-12 MED ORDER — ASPIRIN 81 MG PO CHEW
81.0000 mg | CHEWABLE_TABLET | Freq: Every day | ORAL | Status: DC
  Administered 2020-06-12: 81 mg via ORAL
  Filled 2020-06-12 (×2): qty 1

## 2020-06-12 MED ORDER — EZETIMIBE 10 MG PO TABS
5.0000 mg | ORAL_TABLET | Freq: Every day | ORAL | Status: DC
  Filled 2020-06-12: qty 1

## 2020-06-12 MED ORDER — MEMANTINE HCL ER 28 MG PO CP24
28.0000 mg | ORAL_CAPSULE | Freq: Every day | ORAL | Status: DC
  Administered 2020-06-12: 28 mg via ORAL
  Filled 2020-06-12 (×2): qty 1

## 2020-06-12 MED ORDER — DONEPEZIL HCL 10 MG PO TABS
10.0000 mg | ORAL_TABLET | Freq: Every day | ORAL | Status: DC
  Administered 2020-06-12: 10 mg via ORAL
  Filled 2020-06-12 (×2): qty 1

## 2020-06-12 MED ORDER — CLONIDINE HCL 0.1 MG PO TABS
0.2000 mg | ORAL_TABLET | Freq: Two times a day (BID) | ORAL | Status: DC
  Administered 2020-06-12: 0.2 mg via ORAL
  Filled 2020-06-12 (×2): qty 2

## 2020-06-12 MED ORDER — SERTRALINE HCL 50 MG PO TABS
50.0000 mg | ORAL_TABLET | Freq: Every day | ORAL | Status: DC
  Filled 2020-06-12: qty 1

## 2020-06-12 MED ORDER — BUPROPION HCL ER (XL) 150 MG PO TB24
150.0000 mg | ORAL_TABLET | Freq: Every day | ORAL | Status: DC
  Administered 2020-06-12: 150 mg via ORAL
  Filled 2020-06-12 (×2): qty 1

## 2020-06-12 NOTE — Progress Notes (Addendum)
I have interviewed and examined the patient with Dr Nancy Fetter.  I have discussed the case with Dr. Nancy Fetter.   I agree with their documentation and management in their note for today.   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  New finding since TTE 09/24/2019.             - TTE with severly elevated PAP             -  pCXR 8/18 with bil. ASD and bil pleural effucions c/w CHF             - H/O paroxysmal atrial fibrillation     Chronic Kidney Disease, stage 3b  Plan Continue daily ASA Active diuresis with IV loop diuretic. Consult with cardiology for heart failure recommendations in light of wall motion abnormalities Started low dose metoprolol (Dx of COPD) Started low doseLosartan low-dose, if patient's insurance provides significant cost coverage for Entresto, would switch losartan to this medication. Daily BMEts during active management of cardiac medications.  Check for BMD from CKD with Vit D level, renal panel and iPTH - ordered.  CIR consult recommended by Rehab Admission Coordinator. Order placed for CIR consult

## 2020-06-12 NOTE — Progress Notes (Signed)
CRITICAL VALUE ALERT  Critical Value:  Troponin  2943  Date & Time Notied:  06/12/2020  2008  Provider Notified: FMTS Inter on Call  Orders Received/Actions taken: None

## 2020-06-12 NOTE — Progress Notes (Signed)
STROKE TEAM PROGRESS NOTE   INTERVAL HISTORY Son at the bedside.  I have obtained history of presenting illness with the patient and son, reviewed electronic medical records and imaging films in PACS.  She has baseline dementia but managed to live alone with close supervision by her family.  She was seen by Dr. Felecia Shelling a few weeks ago and had and had a Mini-Mental status exam score   of 16 she continues to have significant left hemiplegia and and surprisingly been able to swallow.  Vitals:   06/12/20 0335 06/12/20 0500 06/12/20 0549 06/12/20 1008  BP: (!) 147/76   (!) 180/98  Pulse: (!) 102   (!) 104  Resp: 20   (!) 21  Temp: 99.7 F (37.6 C)   98 F (36.7 C)  TempSrc: Oral   Oral  SpO2: 91%  (S) (!) 88% 94%  Weight:  86.6 kg    Height:       CBC:  Recent Labs  Lab 06/11/20 1211  WBC 19.0*  NEUTROABS 16.5*  HGB 13.0  13.3  HCT 42.7  39.0  MCV 96.4  PLT 109   Basic Metabolic Panel:  Recent Labs  Lab 06/11/20 1211  NA 145  144  K 4.6  4.3  CL 111  113*  CO2 18*  GLUCOSE 112*  112*  BUN 28*  31*  CREATININE 1.89*  1.80*  CALCIUM 9.5   Lipid Panel:  Recent Labs  Lab 06/12/20 0222  CHOL 160  TRIG 66  HDL 64  CHOLHDL 2.5  VLDL 13  LDLCALC 83   HgbA1c:  Recent Labs  Lab 06/12/20 0222  HGBA1C 5.8*   Urine Drug Screen: No results for input(s): LABOPIA, COCAINSCRNUR, LABBENZ, AMPHETMU, THCU, LABBARB in the last 168 hours.  Alcohol Level No results for input(s): ETH in the last 168 hours.  IMAGING past 24 hours MR BRAIN WO CONTRAST  Result Date: 06/11/2020 CLINICAL DATA:  Stroke.  Left-sided weakness. EXAM: MRI HEAD WITHOUT CONTRAST TECHNIQUE: Multiplanar, multiecho pulse sequences of the brain and surrounding structures were obtained without intravenous contrast. COMPARISON:  CT angio head and neck 8 18 2021.  MRI head 06/05/2020 FINDINGS: Brain: Acute infarct in the right frontal parietal lobe extending to the occipital lobe. This is a watershed territory  infarct. This was not present on the recent MRI of 06/05/2020. Calcific extra-axial mass in the left posterior parietal lobe measuring 18 mm compatible with meningioma. No brain edema. Generalized atrophy with mild chronic microvascular ischemic change in the white matter and pons. Chronic microhemorrhage in the left middle frontal lobe. Vascular: Normal arterial flow voids with stenosis in the distal left vertebral artery. Skull and upper cervical spine: No focal skeletal lesion. Sinuses/Orbits: Mild mucosal edema paranasal sinuses. Bilateral cataract extraction Other: None IMPRESSION: Acute infarct right frontal parietal lobe extending to the occipital lobe. This is a watershed territory infarct without associated hemorrhage. 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 No brain edema. Electronically Signed   By: Franchot Gallo M.D.   On: 06/11/2020 18:02   CT CEREBRAL PERFUSION W CONTRAST  Result Date: 06/11/2020 CLINICAL DATA:  Left-sided weakness EXAM: CT HEAD CODE STROKE CT ANGIOGRAPHY HEAD AND NECK CT PERFUSION BRAIN TECHNIQUE: Multidetector CT imaging of the head and neck was performed using the standard protocol during bolus administration of intravenous contrast. Multiplanar CT image reconstructions and MIPs were obtained to evaluate the vascular anatomy. Carotid stenosis measurements (when applicable) are obtained  utilizing NASCET criteria, using the distal internal carotid diameter as the denominator. Multiphase CT imaging of the brain was performed following IV bolus contrast injection. Subsequent parametric perfusion maps were calculated using RAPID software. CONTRAST:  100 mL Omnipaque 350 COMPARISON:  CT head 09/24/2019, MRI head 06/05/2020 FINDINGS: CT HEAD FINDINGS Brain: There is no acute intracranial hemorrhage or mass effect. There is a small area of hypoattenuation with loss of gray-white differentiation in the right frontal  lobe involving the precentral gyrus. Additional patchy hypoattenuation in the supratentorial white matter is nonspecific but may reflect mild to moderate chronic microvascular ischemic changes. Prominence of the ventricles and sulci reflects generalized parenchymal volume loss. 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 Vascular: No hyperdense vessel. Skull: Unremarkable. Sinuses/Orbits: Retained secretions within the right sphenoid. Patchy ethmoid mucosal thickening. No acute orbital finding. Other: Mastoid air cells are clear. ASPECTS [facility] Stroke Program Early CT Score) - Ganglionic level infarction (caudate, lentiform nuclei, internal capsule, insula, M1-M3 cortex): 7 - Supraganglionic infarction (M4-M6 cortex): 2 Total score (0-10 with 10 being normal): 9 Review of the MIP images confirms the above findings CTA NECK FINDINGS Aortic arch: Calcified and noncalcified plaque along the arch and great vessel origins. 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 Right carotid system: Patent. Multifocal calcified and noncalcified plaque along the common carotid with less than 50% stenosis. Mixed plaque is present at the ICA origin causing approximately 60% stenosis. Left carotid system: Patent. Calcified and noncalcified plaque along the common carotid causing less than 50% stenosis. Mixed plaque at the ICA origin causing approximately 50% stenosis. There is subsequent additional mixed plaque causing at least 80% stenosis. Vertebral arteries: Patent. Right vertebral artery is dominant. Mixed plaque along the left V2 segment causing moderate stenosis. Skeleton: Degenerative changes of the cervical spine. Other neck: Heterogeneous, enlarged left thyroid, which has been previously evaluated by ultrasound. Upper chest: Small bilateral pleural effusions. Interlobular septal thickening and patchy ground-glass density, which may reflect edema. Review  of the MIP images confirms the above findings CTA HEAD FINDINGS Anterior circulation: Intracranial internal carotid arteries are patent with calcified plaque causing moderate stenosis. Anterior and middle cerebral arteries are patent. Posterior circulation: Intracranial vertebral arteries are patent. There is mixed plaque causing mild stenosis on the right and marked stenosis on the left. Patent PICA origins. Basilar artery is patent. Posterior cerebral arteries are patent. Venous sinuses: As permitted by contrast timing, patent. Review of the MIP images confirms the above findings CT Brain Perfusion Findings: CBF (<30%) Volume: 19mL Perfusion (Tmax>6.0s) volume: 31mL Mismatch Volume: 56mL Infarction Location: None IMPRESSION: No acute intracranial hemorrhage. Suspected small acute infarction involving the right precentral gyrus (ASPECT score 9). No large vessel occlusion. No evidence of core infarction or territory at risk by perfusion imaging. Plaque along common and internal carotid arteries bilaterally. Approximately 60% stenosis at the right ICA origin. Approximately 50% stenosis at the left ICA origin with subsequent plaque causing 80% stenosis. Moderate stenosis of the intracranial internal carotid arteries. 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 Partially imaged small bilateral pleural effusions and suspected mild pulmonary edema. Initial results were communicated to Dr. Leonel Ramsay at 12:35 pmon 8/18/2021by text page via the [facility] messaging system. Electronically Signed   By: Macy Mis M.D.   On: 06/11/2020 13:02   DG Chest Port 1 View  Result Date: 06/11/2020 CLINICAL DATA:  Onset shortness of breath and weakness is morning. EXAM: PORTABLE CHEST 1  VIEW COMPARISON:  PA and lateral chest 09/17/2019. FINDINGS: Extensive bilateral airspace disease is somewhat worse on the right. There are small bilateral pleural effusions. Heart size is upper normal. The patient is status post CABG.  IMPRESSION: Right greater than left airspace disease and small effusions have an appearance most suggestive of congestive failure rather than pneumonia. Electronically Signed   By: Inge Rise M.D.   On: 06/11/2020 13:52   CT HEAD CODE STROKE WO CONTRAST  Result Date: 06/11/2020 CLINICAL DATA:  Left-sided weakness EXAM: CT HEAD CODE STROKE CT ANGIOGRAPHY HEAD AND NECK CT PERFUSION BRAIN TECHNIQUE: Multidetector CT imaging of the head and neck was performed using the standard protocol during bolus administration of intravenous contrast. Multiplanar CT image reconstructions and MIPs were obtained to evaluate the vascular anatomy. Carotid stenosis measurements (when applicable) are obtained utilizing NASCET criteria, using the distal internal carotid diameter as the denominator. Multiphase CT imaging of the brain was performed following IV bolus contrast injection. Subsequent parametric perfusion maps were calculated using RAPID software. CONTRAST:  100 mL Omnipaque 350 COMPARISON:  CT head 09/24/2019, MRI head 06/05/2020 FINDINGS: CT HEAD FINDINGS Brain: There is no acute intracranial hemorrhage or mass effect. There is a small area of hypoattenuation with loss of gray-white differentiation in the right frontal lobe involving the precentral gyrus. Additional patchy hypoattenuation in the supratentorial white matter is nonspecific but may reflect mild to moderate chronic microvascular ischemic changes. Prominence of the ventricles and sulci reflects generalized parenchymal volume loss. 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 Vascular: No hyperdense vessel. Skull: Unremarkable. Sinuses/Orbits: Retained secretions within the right sphenoid. Patchy ethmoid mucosal thickening. No acute orbital finding. Other: Mastoid air cells are clear. ASPECTS [facility] Stroke Program Early CT Score) - Ganglionic level infarction (caudate, lentiform nuclei, internal  capsule, insula, M1-M3 cortex): 7 - Supraganglionic infarction (M4-M6 cortex): 2 Total score (0-10 with 10 being normal): 9 Review of the MIP images confirms the above findings CTA NECK FINDINGS Aortic arch: Calcified and noncalcified plaque along the arch and great vessel origins. 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 Right carotid system: Patent. Multifocal calcified and noncalcified plaque along the common carotid with less than 50% stenosis. Mixed plaque is present at the ICA origin causing approximately 60% stenosis. Left carotid system: Patent. Calcified and noncalcified plaque along the common carotid causing less than 50% stenosis. Mixed plaque at the ICA origin causing approximately 50% stenosis. There is subsequent additional mixed plaque causing at least 80% stenosis. Vertebral arteries: Patent. Right vertebral artery is dominant. Mixed plaque along the left V2 segment causing moderate stenosis. Skeleton: Degenerative changes of the cervical spine. Other neck: Heterogeneous, enlarged left thyroid, which has been previously evaluated by ultrasound. Upper chest: Small bilateral pleural effusions. Interlobular septal thickening and patchy ground-glass density, which may reflect edema. Review of the MIP images confirms the above findings CTA HEAD FINDINGS Anterior circulation: Intracranial internal carotid arteries are patent with calcified plaque causing moderate stenosis. Anterior and middle cerebral arteries are patent. Posterior circulation: Intracranial vertebral arteries are patent. There is mixed plaque causing mild stenosis on the right and marked stenosis on the left. Patent PICA origins. Basilar artery is patent. Posterior cerebral arteries are patent. Venous sinuses: As permitted by contrast timing, patent. Review of the MIP images confirms the above findings CT Brain Perfusion Findings: CBF (<30%) Volume: 49mL Perfusion (Tmax>6.0s) volume: 58mL Mismatch Volume: 62mL Infarction  Location: None IMPRESSION: No acute intracranial hemorrhage.  Suspected small acute infarction involving the right precentral gyrus (ASPECT score 9). No large vessel occlusion. No evidence of core infarction or territory at risk by perfusion imaging. Plaque along common and internal carotid arteries bilaterally. Approximately 60% stenosis at the right ICA origin. Approximately 50% stenosis at the left ICA origin with subsequent plaque causing 80% stenosis. Moderate stenosis of the intracranial internal carotid arteries. 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 Partially imaged small bilateral pleural effusions and suspected mild pulmonary edema. Initial results were communicated to Dr. Leonel Ramsay at 12:35 pmon 8/18/2021by text page via the [facility] messaging system. Electronically Signed   By: Macy Mis M.D.   On: 06/11/2020 13:02   CT ANGIO HEAD CODE STROKE  Result Date: 06/11/2020 CLINICAL DATA:  Left-sided weakness EXAM: CT HEAD CODE STROKE CT ANGIOGRAPHY HEAD AND NECK CT PERFUSION BRAIN TECHNIQUE: Multidetector CT imaging of the head and neck was performed using the standard protocol during bolus administration of intravenous contrast. Multiplanar CT image reconstructions and MIPs were obtained to evaluate the vascular anatomy. Carotid stenosis measurements (when applicable) are obtained utilizing NASCET criteria, using the distal internal carotid diameter as the denominator. Multiphase CT imaging of the brain was performed following IV bolus contrast injection. Subsequent parametric perfusion maps were calculated using RAPID software. CONTRAST:  100 mL Omnipaque 350 COMPARISON:  CT head 09/24/2019, MRI head 06/05/2020 FINDINGS: CT HEAD FINDINGS Brain: There is no acute intracranial hemorrhage or mass effect. There is a small area of hypoattenuation with loss of gray-white differentiation in the right frontal lobe involving the precentral gyrus. Additional patchy hypoattenuation in the  supratentorial white matter is nonspecific but may reflect mild to moderate chronic microvascular ischemic changes. Prominence of the ventricles and sulci reflects generalized parenchymal volume loss. 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 Vascular: No hyperdense vessel. Skull: Unremarkable. Sinuses/Orbits: Retained secretions within the right sphenoid. Patchy ethmoid mucosal thickening. No acute orbital finding. Other: Mastoid air cells are clear. ASPECTS [facility] Stroke Program Early CT Score) - Ganglionic level infarction (caudate, lentiform nuclei, internal capsule, insula, M1-M3 cortex): 7 - Supraganglionic infarction (M4-M6 cortex): 2 Total score (0-10 with 10 being normal): 9 Review of the MIP images confirms the above findings CTA NECK FINDINGS Aortic arch: Calcified and noncalcified plaque along the arch and great vessel origins. 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 Right carotid system: Patent. Multifocal calcified and noncalcified plaque along the common carotid with less than 50% stenosis. Mixed plaque is present at the ICA origin causing approximately 60% stenosis. Left carotid system: Patent. Calcified and noncalcified plaque along the common carotid causing less than 50% stenosis. Mixed plaque at the ICA origin causing approximately 50% stenosis. There is subsequent additional mixed plaque causing at least 80% stenosis. Vertebral arteries: Patent. Right vertebral artery is dominant. Mixed plaque along the left V2 segment causing moderate stenosis. Skeleton: Degenerative changes of the cervical spine. Other neck: Heterogeneous, enlarged left thyroid, which has been previously evaluated by ultrasound. Upper chest: Small bilateral pleural effusions. Interlobular septal thickening and patchy ground-glass density, which may reflect edema. Review of the MIP images confirms the above findings CTA HEAD FINDINGS Anterior  circulation: Intracranial internal carotid arteries are patent with calcified plaque causing moderate stenosis. Anterior and middle cerebral arteries are patent. Posterior circulation: Intracranial vertebral arteries are patent. There is mixed plaque causing mild stenosis on the right and marked stenosis on the left. Patent PICA origins. Basilar artery is patent. Posterior  cerebral arteries are patent. Venous sinuses: As permitted by contrast timing, patent. Review of the MIP images confirms the above findings CT Brain Perfusion Findings: CBF (<30%) Volume: 82mL Perfusion (Tmax>6.0s) volume: 57mL Mismatch Volume: 72mL Infarction Location: None IMPRESSION: No acute intracranial hemorrhage. Suspected small acute infarction involving the right precentral gyrus (ASPECT score 9). No large vessel occlusion. No evidence of core infarction or territory at risk by perfusion imaging. Plaque along common and internal carotid arteries bilaterally. Approximately 60% stenosis at the right ICA origin. Approximately 50% stenosis at the left ICA origin with subsequent plaque causing 80% stenosis. Moderate stenosis of the intracranial internal carotid arteries. 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 Partially imaged small bilateral pleural effusions and suspected mild pulmonary edema. Initial results were communicated to Dr. Leonel Ramsay at 12:35 pmon 8/18/2021by text page via the [facility] messaging system. Electronically Signed   By: Macy Mis M.D.   On: 06/11/2020 13:02   CT ANGIO NECK CODE STROKE  Result Date: 06/11/2020 CLINICAL DATA:  Left-sided weakness EXAM: CT HEAD CODE STROKE CT ANGIOGRAPHY HEAD AND NECK CT PERFUSION BRAIN TECHNIQUE: Multidetector CT imaging of the head and neck was performed using the standard protocol during bolus administration of intravenous contrast. Multiplanar CT image reconstructions and MIPs were obtained to evaluate the vascular anatomy. Carotid stenosis measurements (when  applicable) are obtained utilizing NASCET criteria, using the distal internal carotid diameter as the denominator. Multiphase CT imaging of the brain was performed following IV bolus contrast injection. Subsequent parametric perfusion maps were calculated using RAPID software. CONTRAST:  100 mL Omnipaque 350 COMPARISON:  CT head 09/24/2019, MRI head 06/05/2020 FINDINGS: CT HEAD FINDINGS Brain: There is no acute intracranial hemorrhage or mass effect. There is a small area of hypoattenuation with loss of gray-white differentiation in the right frontal lobe involving the precentral gyrus. Additional patchy hypoattenuation in the supratentorial white matter is nonspecific but may reflect mild to moderate chronic microvascular ischemic changes. Prominence of the ventricles and sulci reflects generalized parenchymal volume loss. 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 Vascular: No hyperdense vessel. Skull: Unremarkable. Sinuses/Orbits: Retained secretions within the right sphenoid. Patchy ethmoid mucosal thickening. No acute orbital finding. Other: Mastoid air cells are clear. ASPECTS [facility] Stroke Program Early CT Score) - Ganglionic level infarction (caudate, lentiform nuclei, internal capsule, insula, M1-M3 cortex): 7 - Supraganglionic infarction (M4-M6 cortex): 2 Total score (0-10 with 10 being normal): 9 Review of the MIP images confirms the above findings CTA NECK FINDINGS Aortic arch: Calcified and noncalcified plaque along the arch and great vessel origins. 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 Right carotid system: Patent. Multifocal calcified and noncalcified plaque along the common carotid with less than 50% stenosis. Mixed plaque is present at the ICA origin causing approximately 60% stenosis. Left carotid system: Patent. Calcified and noncalcified plaque along the common carotid causing less than 50% stenosis. Mixed plaque  at the ICA origin causing approximately 50% stenosis. There is subsequent additional mixed plaque causing at least 80% stenosis. Vertebral arteries: Patent. Right vertebral artery is dominant. Mixed plaque along the left V2 segment causing moderate stenosis. Skeleton: Degenerative changes of the cervical spine. Other neck: Heterogeneous, enlarged left thyroid, which has been previously evaluated by ultrasound. Upper chest: Small bilateral pleural effusions. Interlobular septal thickening and patchy ground-glass density, which may reflect edema. Review of the MIP images confirms the above findings CTA HEAD FINDINGS Anterior circulation: Intracranial internal carotid arteries are patent  with calcified plaque causing moderate stenosis. Anterior and middle cerebral arteries are patent. Posterior circulation: Intracranial vertebral arteries are patent. There is mixed plaque causing mild stenosis on the right and marked stenosis on the left. Patent PICA origins. Basilar artery is patent. Posterior cerebral arteries are patent. Venous sinuses: As permitted by contrast timing, patent. Review of the MIP images confirms the above findings CT Brain Perfusion Findings: CBF (<30%) Volume: 58mL Perfusion (Tmax>6.0s) volume: 57mL Mismatch Volume: 41mL Infarction Location: None IMPRESSION: No acute intracranial hemorrhage. Suspected small acute infarction involving the right precentral gyrus (ASPECT score 9). No large vessel occlusion. No evidence of core infarction or territory at risk by perfusion imaging. Plaque along common and internal carotid arteries bilaterally. Approximately 60% stenosis at the right ICA origin. Approximately 50% stenosis at the left ICA origin with subsequent plaque causing 80% stenosis. Moderate stenosis of the intracranial internal carotid arteries. 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 Partially imaged small bilateral pleural effusions and suspected mild pulmonary edema. Initial  results were communicated to Dr. Leonel Ramsay at 12:35 pmon 8/18/2021by text page via the [facility] messaging system. Electronically Signed   By: Macy Mis M.D.   On: 06/11/2020 13:02    PHYSICAL EXAM Pleasant elderly Caucasian lady lying comfortably in bed.  Not in distress. . Afebrile. Head is nontraumatic. Neck is supple without bruit.    Cardiac exam no murmur or gallop. Lungs are clear to auscultation. Distal pulses are well felt. Neurological Exam : Awake alert oriented to person only.  Diminished attention, registration and recall.  Speech is nonfluent hesitant and speaks only short sentences and few words.  Poor insight into her condition.  Extraocular movements are full range without nystagmus.  Blinks to threat bilaterally.  Left lower facial weakness.  Tongue midline.  Dense left hemiplegia with only trace withdrawal in upper and lower extremities with flaccidity and hypotonia.  Normal antigravity movements on the right side.  Left plantar upgoing right downgoing.  Gait not tested ASSESSMENT/PLAN Ms. NARE GASPARI is a 79 y.o. female with history of mild dementia, tobacco abuse, hyperlipidemia, hypertension, paroxysmal atrial fibrillation, diabetes, carotid artery disease and falls presenting with L sided weakness, dysarthria, naming difficulties alone w/ SOB.  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 ASPECTS 9    CTA head & neck B ICA plaque, R ICA origin 60%, L ICA origin 50%, L ICA 80%. Intracranial ICA moderate stenosis. Intracranial L VA severe stenosis. B pleural effusions  CT perfusion no core  MRI  R frontal parietal lobe to occipital lobe infarct watershed territory. L posterior parietal meningioma.  2D Echo pending  LDL 83  HgbA1c 5.8  VTE prophylaxis - Heparin 5000 units sq tid   aspirin 81 mg daily prior to admission, now on No antithrombotic. Added  aspirin and plavix x 3 months then plavix alone given intracranial atherosclerosis.   Therapy recommendations:  SNF  Disposition:  pending   DNR - Dr. Leonie Man had extended conversation w/ son r/t current stroke and severe SOB. Son desires NCB. Order placed. Also discussed palliative care. Son may be interested based on primary team assessment.   Carotid stenosis  CTA neck B ICA plaque, R ICA origin 60%, L ICA origin 50%, L ICA 80%.   Doppler 08/2019 R 40-59%, CCA <50%. ECA > 50%. L 40-59% CCA <50%. ECA > 50%  Not a surgical  candidate given medical issues.  Atrial Fibrillation  Home anticoagulation:  none - frequent falls preventing use  . Not an AC candidate given severe SOB, medical issues, consideration of palliative care    Hypertension  Stable on the high side . Permissive hypertension (OK if < 220/120) but gradually normalize in 5-7 days . Long-term BP goal normotensive  Hyperlipidemia  Home meds:  lipitor 20 and zetia 10, zetia resumed in hospital. Will also add lipitor  LDL 83, goal < 70  Continue statin at discharge  Diabetes type II Controlled  HgbA1c 5.8, goal < 7.0   Other Stroke Risk Factors  Advanced age  Former Cigarette smoker  Obesity, Body mass index is 34.92 kg/m., recommend weight loss, diet and exercise as appropriate   Family hx stroke (brother)  CAD  Chronic diastolic Congestive heart failure - HFpEF  Other Active Problems  Baseline mild dementia and falls followed by Dr. Felecia Shelling at [facility]. Recent falls and weakness  Dyspnea in setting of COPD. Stat breathing tx. Discussed w/ RN  ? LUE DVT. For [place].   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  She has baseline dementia and now disabling stroke and hence she may not be the best candidate for elective right  carotid revascularization.  Patient was able to live independently until now but clearly will not be able to do so.  Family needs to decide on goals of care.  Long discussion with the son at the bedside.  Patient daughter is health power of attorney will arrive later and need to discuss goals of care with her.  Recommend nebulizer treatment for her shortness of breath and wheezing and DNR status as per family wishes.  Discussed with family practice resident on call.  Greater than 50% time during this 35-minute visit was spent in counseling and coordination of care about his stroke and carotid stenosis and dementia and answering questions Antony Contras, MD To contact Stroke Continuity provider, please refer to [email or web address] After hours, contact General Neurology

## 2020-06-12 NOTE — Progress Notes (Signed)
Cardiology Consultation:  Patient ID: Cheryl Summers MRN: [id number]; DOB: 1941/09/14  Admit date: 06/11/2020 Date of Consult: 06/12/2020  Primary Care Provider: Nicholos Johns, MD Primary Cardiologist: No primary care provider on file.   Patient Profile:  Cheryl Summers is a 79 y.o. female with a hx of paroxysmal atrial fibrillation (not on Coumadin due to falls), CAD status post CABG in 2012, left subclavian artery stenosis (without symptoms or coronary ischemia), dementia, diabetes, COPD who is being seen today for the evaluation of abnormal echocardiogram at the request of Tood McDiarmid, MD.  History of Present Illness:  [name] was admitted on 06/11/2020 with left-sided weakness and found to have stroke.  She was found to have a right frontal parietal lobe and occipital lobe infarct secondary to large vessel disease from the proximal right internal carotid artery.  She apparently has also had fluid overload and elevated BNP.  An echocardiogram was obtained that demonstrated ejection fraction of 30% with multiple wall motion normalities.  Cardiology was then consulted.  At the time my examination, she is alert and oriented to self only.  She is quite confused and is being changed by hospital staff.  Hospital staff reports he is complained of back pain but no chest pain.  I did review her echocardiogram from several months ago which showed normal ejection fraction.  She does have history of CABG but no recent ischemia evaluation.  Due to lower extremity edema, she was given 80 mg IV Lasix.  Creatinine appears to be coming down.  Family not at the bedside at the time my examination.  She is currently DNR.  There is mention of palliative care consult.  She appears to be in no distress.  Heart Pathway Score:       Past Medical History: Past Medical History:  Diagnosis Date  . Anxiety   . Carotid artery stenosis, asymptomatic    50% ASYMPTOMATIC  RIGHT CAROTID STENOSIS  . Chronic diastolic  heart failure ([place]) 06/09/2015  . Chronic heart failure with preserved ejection fraction (HFpEF) ([place]) 06/09/2015  . COPD (chronic obstructive pulmonary disease) ([place])   . Depression   . Diabetes mellitus without complication ([place])   . Dysrhythmia   . Gastroesophageal reflux   . Hyperlipidemia   . Hypertension   . Hypertensive heart disease with heart failure ([place]) 06/09/2015  . PAF (paroxysmal atrial fibrillation) ([place]) 06/09/2015   Overview:  CHADS2 vasc score=6  . Renal insufficiency   . Tobacco abuse   . Tremors of nervous system     Past Surgical History: Past Surgical History:  Procedure Laterality Date  . ABDOMINAL HYSTERECTOMY    . CATARACT EXTRACTION  2016  . CORONARY ARTERY BYPASS GRAFT  2012  . NOSE SURGERY       Home Medications:  Prior to Admission medications   Medication Sig Start Date End Date Taking? Authorizing Provider  acetaminophen (TYLENOL) 500 MG tablet Take 500-1,000 mg by mouth every 6 (six) hours as needed for mild pain or headache.   Yes [provider]  albuterol (PROVENTIL HFA;VENTOLIN HFA) 108 (90 Base) MCG/ACT inhaler Inhale 2 puffs into the lungs every 6 (six) hours as needed for wheezing or shortness of breath.   Yes [provider]  albuterol (PROVENTIL) (2.5 MG/3ML) 0.083% nebulizer solution Take 2.5 mg by nebulization daily as needed for wheezing or shortness of breath.   Yes [provider]  allopurinol (ZYLOPRIM) 100 MG tablet Take 100 mg by mouth daily as needed (gout).  Yes [provider]  amLODipine (NORVASC) 5 MG tablet Take 5 mg by mouth daily.   Yes [provider]  Artificial Tear Solution (SOOTHE XP) SOLN Place 1 drop into both eyes daily as needed (dry eyes).   Yes [provider]  aspirin EC 81 MG tablet Take 81 mg by mouth daily.   Yes [provider]  atorvastatin (LIPITOR) 20 MG tablet Take 20 mg by mouth at bedtime.    Yes [provider]    budesonide-formoterol (SYMBICORT) 160-4.5 MCG/ACT inhaler Inhale 2 puffs into the lungs 2 (two) times daily as needed (shortness of breath).    Yes [provider]  buPROPion (WELLBUTRIN XL) 150 MG 24 hr tablet Take 150 mg by mouth at bedtime.    Yes [provider]  cloNIDine (CATAPRES) 0.2 MG tablet Take 0.2 mg by mouth in the morning and at bedtime. 05/19/20  Yes [provider]  donepezil (ARICEPT) 10 MG tablet Take 10 mg by mouth daily.    Yes [provider]  ezetimibe (ZETIA) 10 MG tablet Take 5 mg by mouth at bedtime.    Yes [provider]  FARXIGA 10 MG TABS tablet Take 5 mg by mouth daily. 05/05/20  Yes [provider]  fluticasone (FLONASE) 50 MCG/ACT nasal spray Place 1-2 sprays into both nostrils as needed for allergies.  05/11/19  Yes [provider]  memantine (NAMENDA XR) 28 MG CP24 24 hr capsule Take 1 capsule by mouth daily. 06/11/19  Yes [provider]  montelukast (SINGULAIR) 10 MG tablet Take 10 mg by mouth at bedtime.    Yes [provider]  Multiple Vitamin (MULTIVITAMIN) tablet Take 1 tablet by mouth daily.   Yes [provider]  nystatin-triamcinolone (MYCOLOG II) cream Apply 1 application topically daily as needed (skin bumps).   Yes [provider]  omeprazole (PRILOSEC) 20 MG capsule Take 20 mg by mouth daily.    Yes [provider]  polyethylene glycol (MIRALAX) packet Take 17 g by mouth daily. Patient taking differently: Take 17 g by mouth daily as needed for mild constipation.  06/10/18  Yes Vasireddy, Grier Mitts, MD  pregabalin (LYRICA) 75 MG capsule Take 75 mg by mouth 2 (two) times daily.   Yes [provider]  sertraline (ZOLOFT) 50 MG tablet Take 50 mg by mouth at bedtime.    Yes [provider]  sodium chloride (OCEAN) 0.65 % SOLN nasal spray Place 1 spray into both nostrils as needed for congestion.   Yes [provider]  telmisartan  (MICARDIS) 80 MG tablet Take 80 mg by mouth daily.   Yes [provider]  tiZANidine (ZANAFLEX) 2 MG tablet Take 2 mg by mouth at bedtime.    Yes [provider]  torsemide (DEMADEX) 20 MG tablet Take 20 mg by mouth every Monday, Wednesday, and Friday.    Yes [provider]    Inpatient Medications: Scheduled Meds: .  stroke: mapping our early stages of recovery book   Does not apply Once  . aspirin  81 mg Oral Daily  . atorvastatin  20 mg Oral QHS  . buPROPion  150 mg Oral QHS  . cloNIDine  0.2 mg Oral BID  . clopidogrel  75 mg Oral Daily  . donepezil  10 mg Oral Daily  . ezetimibe  5 mg Oral QHS  . heparin  5,000 Units Subcutaneous Q8H  . ipratropium-albuterol  3 mL Nebulization Q6H  . losartan  25 mg  Oral Daily  . memantine  28 mg Oral Daily  . metoprolol tartrate  12.5 mg Oral BID  . mometasone-formoterol  2 puff Inhalation BID  . montelukast  10 mg Oral QHS  . pantoprazole  40 mg Oral Daily  . pregabalin  75 mg Oral BID  . sertraline  50 mg Oral QHS   Continuous Infusions:  PRN Meds: acetaminophen **OR** acetaminophen (TYLENOL) oral liquid 160 mg/5 mL **OR** acetaminophen, albuterol, polyvinyl alcohol  Allergies:    No Known Allergies  Social History:   Social History   Socioeconomic History  . Marital status: Widowed    Spouse name: Not on file  . Number of children: 4  . Years of education: 30  . Highest education level: Not on file  Occupational History  . Not on file  Tobacco Use  . Smoking status: Former Research scientist (life sciences)  . Smokeless tobacco: Never Used  Vaping Use  . Vaping Use: Never used  Substance and Sexual Activity  . Alcohol use: No  . Drug use: No  . Sexual activity: Not on file  Other Topics Concern  . Not on file  Social History Narrative   Right handed    Lives with grandson and twins   Caffeine use: daily coffee/tea.   Social Determinants of Health   Financial Resource Strain:   . Difficulty of Paying Living  Expenses: Not on file  Food Insecurity:   . Worried About Charity fundraiser in the Last Year: Not on file  . Ran Out of Food in the Last Year: Not on file  Transportation Needs:   . Lack of Transportation (Medical): Not on file  . Lack of Transportation (Non-Medical): Not on file  Physical Activity:   . Days of Exercise per Week: Not on file  . Minutes of Exercise per Session: Not on file  Stress:   . Feeling of Stress : Not on file  Social Connections:   . Frequency of Communication with Friends and Family: Not on file  . Frequency of Social Gatherings with Friends and Family: Not on file  . Attends Religious Services: Not on file  . Active Member of Clubs or Organizations: Not on file  . Attends Archivist Meetings: Not on file  . Marital Status: Not on file  Intimate Partner Violence:   . Fear of Current or Ex-Partner: Not on file  . Emotionally Abused: Not on file  . Physically Abused: Not on file  . Sexually Abused: Not on file     Family History:    Family History  Problem Relation Age of Onset  . Heart attack Mother   . Hypertension Mother   . Hyperlipidemia Mother   . Heart attack Father   . Hypertension Father   . Hyperlipidemia Father   . Heart attack Sister   . Stroke Brother   . Heart attack Brother   . Hypertension Daughter      ROS:  All other ROS reviewed and negative. Pertinent positives noted in the HPI.     Physical Exam/Data:   Vitals:   06/12/20 1008 06/12/20 1133 06/12/20 1347 06/12/20 1629  BP: (!) 180/98 (!) 178/91 (!) 179/129 (!) 155/86  Pulse: (!) 104 (!) 106 (!) 103 98  Resp: (!) 21 (!) 28 (!) 26 (!) 21  Temp: 98 F (36.7 C) 98.8 F (37.1 C) 98.1 F (36.7 C) 99.5 F (37.5 C)  TempSrc: Oral Oral Oral Axillary  SpO2: 94% 98% 94% 94%  Weight:  Height:         Intake/Output Summary (Last 24 hours) at 06/12/2020 1749 Last data filed at 06/12/2020 1632 Gross per 24 hour  Intake --  Output 650 ml  Net -650 ml     Last 3 Weights 06/12/2020 06/11/2020 06/02/2020  Weight (lbs) 190 lb 14.7 oz 200 lb 192 lb  Weight (kg) 86.6 kg 90.719 kg 87.091 kg    Body mass index is 34.92 kg/m.   General: Well nourished, well developed, in no acute distress Head: Atraumatic, normal size  Eyes: PEERLA, EOMI  Neck: Supple, +JVD Endocrine: No thryomegaly Cardiac: Normal S1, S2; tachycardia noted Lungs: +rales Abd: Soft, nontender, no hepatomegaly  Ext: 1+ edema  Musculoskeletal: No deformities, BUE and BLE strength normal and equal Skin: Warm and dry, no rashes   Neuro: Awake, confused, oriented to person only  EKG:  The EKG was personally reviewed and demonstrates: Sinus rhythm, heart rate 97, no acute ST-T changes suggestive of ischemia or infarction Telemetry:  Telemetry was personally reviewed and demonstrates: Sinus tachycardia with PACs, rate 90 to 100 bpm  Relevant CV Studies: Echocardiogram demonstrates EF around 30% with akinesis including the mid to distal anterior/septal/lateral/inferior segments, consistent with stress-induced cardiomyopathy  Laboratory Data: High Sensitivity Troponin:  No results for input(s): TROPONINIHS in the last 720 hours.   Cardiac EnzymesNo results for input(s): TROPONINI in the last 168 hours. No results for input(s): TROPIPOC in the last 168 hours.  Chemistry Recent Labs  Lab 06/11/20 1211 06/12/20 1041  NA 145  144 144  K 4.6  4.3 4.1  CL 111  113* 113*  CO2 18* 18*  GLUCOSE 112*  112* 191*  BUN 28*  31* 26*  CREATININE 1.89*  1.80* 1.53*  CALCIUM 9.5 9.4  GFRNONAA 24* 32*  GFRAA 28* 37*  ANIONGAP 16* 13    Recent Labs  Lab 06/11/20 1211  PROT 7.2  ALBUMIN 4.0  AST 40  ALT 25  ALKPHOS 97  BILITOT 0.7   Hematology Recent Labs  Lab 06/11/20 1211  WBC 19.0*  RBC 4.43  HGB 13.0  13.3  HCT 42.7  39.0  MCV 96.4  MCH 29.3  MCHC 30.4  RDW 14.2  PLT 237   BNP Recent Labs  Lab 06/11/20 2005  BNP 2,163.4*    DDimer No results for  input(s): DDIMER in the last 168 hours.  Radiology/Studies:  MR BRAIN WO CONTRAST  Result Date: 06/11/2020 CLINICAL DATA:  Stroke.  Left-sided weakness. EXAM: MRI HEAD WITHOUT CONTRAST TECHNIQUE: Multiplanar, multiecho pulse sequences of the brain and surrounding structures were obtained without intravenous contrast. COMPARISON:  CT angio head and neck 8 18 2021.  MRI head 06/05/2020 FINDINGS: Brain: Acute infarct in the right frontal parietal lobe extending to the occipital lobe. This is a watershed territory infarct. This was not present on the recent MRI of 06/05/2020. Calcific extra-axial mass in the left posterior parietal lobe measuring 18 mm compatible with meningioma. No brain edema. Generalized atrophy with mild chronic microvascular ischemic change in the white matter and pons. Chronic microhemorrhage in the left middle frontal lobe. Vascular: Normal arterial flow voids with stenosis in the distal left vertebral artery. Skull and upper cervical spine: No focal skeletal lesion. Sinuses/Orbits: Mild mucosal edema paranasal sinuses. Bilateral cataract extraction Other: None IMPRESSION: Acute infarct right frontal parietal lobe extending to the occipital lobe. This is a watershed territory infarct without associated hemorrhage. Atrophy and mild chronic microvascular ischemic change 18 mm calcified meningioma left posterior  parietal lobe. No brain edema. Electronically Signed   By: Franchot Gallo M.D.   On: 06/11/2020 18:02   CT CEREBRAL PERFUSION W CONTRAST  Result Date: 06/11/2020 CLINICAL DATA:  Left-sided weakness EXAM: CT HEAD CODE STROKE CT ANGIOGRAPHY HEAD AND NECK CT PERFUSION BRAIN TECHNIQUE: Multidetector CT imaging of the head and neck was performed using the standard protocol during bolus administration of intravenous contrast. Multiplanar CT image reconstructions and MIPs were obtained to evaluate the vascular anatomy. Carotid stenosis measurements (when applicable) are obtained utilizing  NASCET criteria, using the distal internal carotid diameter as the denominator. Multiphase CT imaging of the brain was performed following IV bolus contrast injection. Subsequent parametric perfusion maps were calculated using RAPID software. CONTRAST:  100 mL Omnipaque 350 COMPARISON:  CT head 09/24/2019, MRI head 06/05/2020 FINDINGS: CT HEAD FINDINGS Brain: There is no acute intracranial hemorrhage or mass effect. There is a small area of hypoattenuation with loss of gray-white differentiation in the right frontal lobe involving the precentral gyrus. Additional patchy hypoattenuation in the supratentorial white matter is nonspecific but may reflect mild to moderate chronic microvascular ischemic changes. Prominence of the ventricles and sulci reflects generalized parenchymal volume loss. 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 Vascular: No hyperdense vessel. Skull: Unremarkable. Sinuses/Orbits: Retained secretions within the right sphenoid. Patchy ethmoid mucosal thickening. No acute orbital finding. Other: Mastoid air cells are clear. ASPECTS [facility] Stroke Program Early CT Score) - Ganglionic level infarction (caudate, lentiform nuclei, internal capsule, insula, M1-M3 cortex): 7 - Supraganglionic infarction (M4-M6 cortex): 2 Total score (0-10 with 10 being normal): 9 Review of the MIP images confirms the above findings CTA NECK FINDINGS Aortic arch: Calcified and noncalcified plaque along the arch and great vessel origins. 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 Right carotid system: Patent. Multifocal calcified and noncalcified plaque along the common carotid with less than 50% stenosis. Mixed plaque is present at the ICA origin causing approximately 60% stenosis. Left carotid system: Patent. Calcified and noncalcified plaque along the common carotid causing less than 50% stenosis. Mixed plaque at the ICA origin causing  approximately 50% stenosis. There is subsequent additional mixed plaque causing at least 80% stenosis. Vertebral arteries: Patent. Right vertebral artery is dominant. Mixed plaque along the left V2 segment causing moderate stenosis. Skeleton: Degenerative changes of the cervical spine. Other neck: Heterogeneous, enlarged left thyroid, which has been previously evaluated by ultrasound. Upper chest: Small bilateral pleural effusions. Interlobular septal thickening and patchy ground-glass density, which may reflect edema. Review of the MIP images confirms the above findings CTA HEAD FINDINGS Anterior circulation: Intracranial internal carotid arteries are patent with calcified plaque causing moderate stenosis. Anterior and middle cerebral arteries are patent. Posterior circulation: Intracranial vertebral arteries are patent. There is mixed plaque causing mild stenosis on the right and marked stenosis on the left. Patent PICA origins. Basilar artery is patent. Posterior cerebral arteries are patent. Venous sinuses: As permitted by contrast timing, patent. Review of the MIP images confirms the above findings CT Brain Perfusion Findings: CBF (<30%) Volume: 74mL Perfusion (Tmax>6.0s) volume: 59mL Mismatch Volume: 48mL Infarction Location: None IMPRESSION: No acute intracranial hemorrhage. Suspected small acute infarction involving the right precentral gyrus (ASPECT score 9). No large vessel occlusion. No evidence of core infarction or territory at risk by perfusion imaging. Plaque along common and internal carotid arteries bilaterally. Approximately 60% stenosis at the right ICA origin. Approximately 50% stenosis at the left ICA origin with subsequent  plaque causing 80% stenosis. Moderate stenosis of the intracranial internal carotid arteries. 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 Partially imaged small bilateral pleural effusions and suspected mild pulmonary edema. Initial results were communicated to  Dr. Leonel Ramsay at 12:35 pmon 8/18/2021by text page via the [facility] messaging system. Electronically Signed   By: Macy Mis M.D.   On: 06/11/2020 13:02   DG Chest Port 1 View  Result Date: 06/11/2020 CLINICAL DATA:  Onset shortness of breath and weakness is morning. EXAM: PORTABLE CHEST 1 VIEW COMPARISON:  PA and lateral chest 09/17/2019. FINDINGS: Extensive bilateral airspace disease is somewhat worse on the right. There are small bilateral pleural effusions. Heart size is upper normal. The patient is status post CABG. IMPRESSION: Right greater than left airspace disease and small effusions have an appearance most suggestive of congestive failure rather than pneumonia. Electronically Signed   By: Inge Rise M.D.   On: 06/11/2020 13:52   ECHOCARDIOGRAM COMPLETE  Result Date: 06/12/2020    ECHOCARDIOGRAM REPORT   Patient Name:   Cheryl Summers Date of Exam: 06/12/2020 Medical Rec #:  [id number]      Height:       62.0 in Accession #:    [id number]     Weight:       190.9 lb Date of Birth:  10-27-1940      BSA:          1.874 m Patient Age:    29 years       BP:           147/76 mmHg Patient Gender: F              HR:           110 bpm. Exam Location:  Inpatient Procedure: 2D Echo, 3D Echo, Cardiac Doppler and Color Doppler REPORT CONTAINS CRITICAL RESULT Indications:    Stroke  History:        Patient has prior history of Echocardiogram examinations, most                 recent 09/24/2019. Abnormal ECG, Stroke and COPD,                 Arrythmias:Atrial Fibrillation, Signs/Symptoms:Murmur, Altered                 Mental Status, Dyspnea and Shortness of Breath; Risk                 Factors:Hypertension and Current Smoker.  Sonographer:    Roseanna Rainbow RDCS Referring Phys: [id number] DAN FLOYD IMPRESSIONS  1. LVEF is depressed with akinesis of the mid/distal septal, mid/distal inferior, distal anterior, distal lateral and apical walls. COmpared to echo in 2020, these changes are new.. Left ventricular ejection  fraction, by estimation, is 30 to 35%. The left ventricle has moderate to severely decreased function. There is mild left ventricular hypertrophy. Left ventricular diastolic parameters are indeterminate.  2. Right ventricular systolic function is normal. The right ventricular size is normal. There is severely elevated pulmonary artery systolic pressure.  3. Left atrial size was mildly dilated.  4. The mitral valve is normal in structure. Mild mitral valve regurgitation.  5. Tricuspid valve regurgitation is mild to moderate.  6. The aortic valve is abnormal. Aortic valve regurgitation is trivial. Mild aortic valve sclerosis is present, with no evidence of aortic valve stenosis. FINDINGS  Left Ventricle: LVEF is depressed with akinesis of the mid/distal septal, mid/distal inferior, distal anterior, distal lateral  and apical walls. COmpared to echo in 2020, these changes are new. Left ventricular ejection fraction, by estimation, is 30 to  35%. The left ventricle has moderate to severely decreased function. The left ventricle demonstrates regional wall motion abnormalities. Definity contrast agent was given IV to delineate the left ventricular endocardial borders. The left ventricular internal cavity size was normal in size. There is mild left ventricular hypertrophy. Left ventricular diastolic parameters are indeterminate. Right Ventricle: The right ventricular size is normal. No increase in right ventricular wall thickness. Right ventricular systolic function is normal. There is severely elevated pulmonary artery systolic pressure. The tricuspid regurgitant velocity is 3.65 m/s, and with an assumed right atrial pressure of 15 mmHg, the estimated right ventricular systolic pressure is 94.8 mmHg. Left Atrium: Left atrial size was mildly dilated. Right Atrium: Right atrial size was normal in size. Pericardium: Trivial pericardial effusion is present. Mitral Valve: The mitral valve is normal in structure. Mild mitral  valve regurgitation. Tricuspid Valve: The tricuspid valve is normal in structure. Tricuspid valve regurgitation is mild to moderate. Aortic Valve: The aortic valve is abnormal. Aortic valve regurgitation is trivial. Mild aortic valve sclerosis is present, with no evidence of aortic valve stenosis. Pulmonic Valve: The pulmonic valve was grossly normal. Pulmonic valve regurgitation is mild to moderate. Aorta: The aortic root is normal in size and structure. IAS/Shunts: The interatrial septum was not assessed.  LEFT VENTRICLE PLAX 2D LVIDd:         4.50 cm      Diastology LVIDs:         2.90 cm      LV e' lateral:   7.40 cm/s LV PW:         1.20 cm      LV E/e' lateral: 15.2 LV IVS:        1.30 cm      LV e' medial:    5.77 cm/s LVOT diam:     1.70 cm      LV E/e' medial:  19.5 LV SV:         40 LV SV Index:   21 LVOT Area:     2.27 cm  LV Volumes (MOD) LV vol d, MOD A2C: 101.0 ml LV vol d, MOD A4C: 82.7 ml LV vol s, MOD A2C: 46.3 ml LV vol s, MOD A4C: 55.7 ml LV SV MOD A2C:     54.7 ml LV SV MOD A4C:     82.7 ml LV SV MOD BP:      42.2 ml RIGHT VENTRICLE             IVC RV S prime:     10.70 cm/s  IVC diam: 1.80 cm TAPSE (M-mode): 1.5 cm LEFT ATRIUM             Index       RIGHT ATRIUM           Index LA diam:        4.40 cm 2.35 cm/m  RA Area:     14.60 cm LA Vol (A2C):   76.4 ml 40.76 ml/m RA Volume:   35.00 ml  18.67 ml/m LA Vol (A4C):   58.1 ml 31.00 ml/m LA Biplane Vol: 70.6 ml 37.67 ml/m  AORTIC VALVE             PULMONIC VALVE LVOT Vmax:   112.00 cm/s PR End Diast Vel: 3.17 msec LVOT Vmean:  73.400 cm/s LVOT VTI:    0.175 m  AORTA Ao Root diam: 3.00 cm Ao Asc diam:  3.15 cm MITRAL VALVE                TRICUSPID VALVE MV Area (PHT): 4.07 cm     TR Peak grad:   53.3 mmHg MV Decel Time: 187 msec     TR Vmax:        365.00 cm/s MR PISA:        1.01 cm MR PISA Radius: 0.40 cm     SHUNTS MV E velocity: 112.50 cm/s  Systemic VTI:  0.18 m MV A velocity: 73.30 cm/s   Systemic Diam: 1.70 cm MV E/A ratio:  1.53  Dorris Carnes MD Electronically signed by Dorris Carnes MD Signature Date/Time: 06/12/2020/12:56:11 PM    Final    CT HEAD CODE STROKE WO CONTRAST  Result Date: 06/11/2020 CLINICAL DATA:  Left-sided weakness EXAM: CT HEAD CODE STROKE CT ANGIOGRAPHY HEAD AND NECK CT PERFUSION BRAIN TECHNIQUE: Multidetector CT imaging of the head and neck was performed using the standard protocol during bolus administration of intravenous contrast. Multiplanar CT image reconstructions and MIPs were obtained to evaluate the vascular anatomy. Carotid stenosis measurements (when applicable) are obtained utilizing NASCET criteria, using the distal internal carotid diameter as the denominator. Multiphase CT imaging of the brain was performed following IV bolus contrast injection. Subsequent parametric perfusion maps were calculated using RAPID software. CONTRAST:  100 mL Omnipaque 350 COMPARISON:  CT head 09/24/2019, MRI head 06/05/2020 FINDINGS: CT HEAD FINDINGS Brain: There is no acute intracranial hemorrhage or mass effect. There is a small area of hypoattenuation with loss of gray-white differentiation in the right frontal lobe involving the precentral gyrus. Additional patchy hypoattenuation in the supratentorial white matter is nonspecific but may reflect mild to moderate chronic microvascular ischemic changes. Prominence of the ventricles and sulci reflects generalized parenchymal volume loss. 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 Vascular: No hyperdense vessel. Skull: Unremarkable. Sinuses/Orbits: Retained secretions within the right sphenoid. Patchy ethmoid mucosal thickening. No acute orbital finding. Other: Mastoid air cells are clear. ASPECTS [facility] Stroke Program Early CT Score) - Ganglionic level infarction (caudate, lentiform nuclei, internal capsule, insula, M1-M3 cortex): 7 - Supraganglionic infarction (M4-M6 cortex): 2 Total score (0-10 with 10 being normal):  9 Review of the MIP images confirms the above findings CTA NECK FINDINGS Aortic arch: Calcified and noncalcified plaque along the arch and great vessel origins. 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 Right carotid system: Patent. Multifocal calcified and noncalcified plaque along the common carotid with less than 50% stenosis. Mixed plaque is present at the ICA origin causing approximately 60% stenosis. Left carotid system: Patent. Calcified and noncalcified plaque along the common carotid causing less than 50% stenosis. Mixed plaque at the ICA origin causing approximately 50% stenosis. There is subsequent additional mixed plaque causing at least 80% stenosis. Vertebral arteries: Patent. Right vertebral artery is dominant. Mixed plaque along the left V2 segment causing moderate stenosis. Skeleton: Degenerative changes of the cervical spine. Other neck: Heterogeneous, enlarged left thyroid, which has been previously evaluated by ultrasound. Upper chest: Small bilateral pleural effusions. Interlobular septal thickening and patchy ground-glass density, which may reflect edema. Review of the MIP images confirms the above findings CTA HEAD FINDINGS Anterior circulation: Intracranial internal carotid arteries are patent with calcified plaque causing moderate stenosis. Anterior and middle cerebral arteries are patent. Posterior circulation: Intracranial vertebral arteries are patent. There is mixed plaque causing mild stenosis  on the right and marked stenosis on the left. Patent PICA origins. Basilar artery is patent. Posterior cerebral arteries are patent. Venous sinuses: As permitted by contrast timing, patent. Review of the MIP images confirms the above findings CT Brain Perfusion Findings: CBF (<30%) Volume: 35mL Perfusion (Tmax>6.0s) volume: 71mL Mismatch Volume: 70mL Infarction Location: None IMPRESSION: No acute intracranial hemorrhage. Suspected small acute infarction involving the right precentral  gyrus (ASPECT score 9). No large vessel occlusion. No evidence of core infarction or territory at risk by perfusion imaging. Plaque along common and internal carotid arteries bilaterally. Approximately 60% stenosis at the right ICA origin. Approximately 50% stenosis at the left ICA origin with subsequent plaque causing 80% stenosis. Moderate stenosis of the intracranial internal carotid arteries. 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 Partially imaged small bilateral pleural effusions and suspected mild pulmonary edema. Initial results were communicated to Dr. Leonel Ramsay at 12:35 pmon 8/18/2021by text page via the [facility] messaging system. Electronically Signed   By: Macy Mis M.D.   On: 06/11/2020 13:02   CT ANGIO HEAD CODE STROKE  Result Date: 06/11/2020 CLINICAL DATA:  Left-sided weakness EXAM: CT HEAD CODE STROKE CT ANGIOGRAPHY HEAD AND NECK CT PERFUSION BRAIN TECHNIQUE: Multidetector CT imaging of the head and neck was performed using the standard protocol during bolus administration of intravenous contrast. Multiplanar CT image reconstructions and MIPs were obtained to evaluate the vascular anatomy. Carotid stenosis measurements (when applicable) are obtained utilizing NASCET criteria, using the distal internal carotid diameter as the denominator. Multiphase CT imaging of the brain was performed following IV bolus contrast injection. Subsequent parametric perfusion maps were calculated using RAPID software. CONTRAST:  100 mL Omnipaque 350 COMPARISON:  CT head 09/24/2019, MRI head 06/05/2020 FINDINGS: CT HEAD FINDINGS Brain: There is no acute intracranial hemorrhage or mass effect. There is a small area of hypoattenuation with loss of gray-white differentiation in the right frontal lobe involving the precentral gyrus. Additional patchy hypoattenuation in the supratentorial white matter is nonspecific but may reflect mild to moderate chronic microvascular ischemic changes. Prominence  of the ventricles and sulci reflects generalized parenchymal volume loss. 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 Vascular: No hyperdense vessel. Skull: Unremarkable. Sinuses/Orbits: Retained secretions within the right sphenoid. Patchy ethmoid mucosal thickening. No acute orbital finding. Other: Mastoid air cells are clear. ASPECTS [facility] Stroke Program Early CT Score) - Ganglionic level infarction (caudate, lentiform nuclei, internal capsule, insula, M1-M3 cortex): 7 - Supraganglionic infarction (M4-M6 cortex): 2 Total score (0-10 with 10 being normal): 9 Review of the MIP images confirms the above findings CTA NECK FINDINGS Aortic arch: Calcified and noncalcified plaque along the arch and great vessel origins. 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 Right carotid system: Patent. Multifocal calcified and noncalcified plaque along the common carotid with less than 50% stenosis. Mixed plaque is present at the ICA origin causing approximately 60% stenosis. Left carotid system: Patent. Calcified and noncalcified plaque along the common carotid causing less than 50% stenosis. Mixed plaque at the ICA origin causing approximately 50% stenosis. There is subsequent additional mixed plaque causing at least 80% stenosis. Vertebral arteries: Patent. Right vertebral artery is dominant. Mixed plaque along the left V2 segment causing moderate stenosis. Skeleton: Degenerative changes of the cervical spine. Other neck: Heterogeneous, enlarged left thyroid, which has been previously evaluated by ultrasound. Upper chest: Small bilateral pleural effusions. Interlobular septal thickening and patchy ground-glass density, which may reflect edema. Review of the  MIP images confirms the above findings CTA HEAD FINDINGS Anterior circulation: Intracranial internal carotid arteries are patent with calcified plaque causing moderate stenosis. Anterior and middle  cerebral arteries are patent. Posterior circulation: Intracranial vertebral arteries are patent. There is mixed plaque causing mild stenosis on the right and marked stenosis on the left. Patent PICA origins. Basilar artery is patent. Posterior cerebral arteries are patent. Venous sinuses: As permitted by contrast timing, patent. Review of the MIP images confirms the above findings CT Brain Perfusion Findings: CBF (<30%) Volume: 72mL Perfusion (Tmax>6.0s) volume: 34mL Mismatch Volume: 21mL Infarction Location: None IMPRESSION: No acute intracranial hemorrhage. Suspected small acute infarction involving the right precentral gyrus (ASPECT score 9). No large vessel occlusion. No evidence of core infarction or territory at risk by perfusion imaging. Plaque along common and internal carotid arteries bilaterally. Approximately 60% stenosis at the right ICA origin. Approximately 50% stenosis at the left ICA origin with subsequent plaque causing 80% stenosis. Moderate stenosis of the intracranial internal carotid arteries. 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 Partially imaged small bilateral pleural effusions and suspected mild pulmonary edema. Initial results were communicated to Dr. Leonel Ramsay at 12:35 pmon 8/18/2021by text page via the [facility] messaging system. Electronically Signed   By: Macy Mis M.D.   On: 06/11/2020 13:02   CT ANGIO NECK CODE STROKE  Result Date: 06/11/2020 CLINICAL DATA:  Left-sided weakness EXAM: CT HEAD CODE STROKE CT ANGIOGRAPHY HEAD AND NECK CT PERFUSION BRAIN TECHNIQUE: Multidetector CT imaging of the head and neck was performed using the standard protocol during bolus administration of intravenous contrast. Multiplanar CT image reconstructions and MIPs were obtained to evaluate the vascular anatomy. Carotid stenosis measurements (when applicable) are obtained utilizing NASCET criteria, using the distal internal carotid diameter as the denominator. Multiphase CT  imaging of the brain was performed following IV bolus contrast injection. Subsequent parametric perfusion maps were calculated using RAPID software. CONTRAST:  100 mL Omnipaque 350 COMPARISON:  CT head 09/24/2019, MRI head 06/05/2020 FINDINGS: CT HEAD FINDINGS Brain: There is no acute intracranial hemorrhage or mass effect. There is a small area of hypoattenuation with loss of gray-white differentiation in the right frontal lobe involving the precentral gyrus. Additional patchy hypoattenuation in the supratentorial white matter is nonspecific but may reflect mild to moderate chronic microvascular ischemic changes. Prominence of the ventricles and sulci reflects generalized parenchymal volume loss. 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 Vascular: No hyperdense vessel. Skull: Unremarkable. Sinuses/Orbits: Retained secretions within the right sphenoid. Patchy ethmoid mucosal thickening. No acute orbital finding. Other: Mastoid air cells are clear. ASPECTS [facility] Stroke Program Early CT Score) - Ganglionic level infarction (caudate, lentiform nuclei, internal capsule, insula, M1-M3 cortex): 7 - Supraganglionic infarction (M4-M6 cortex): 2 Total score (0-10 with 10 being normal): 9 Review of the MIP images confirms the above findings CTA NECK FINDINGS Aortic arch: Calcified and noncalcified plaque along the arch and great vessel origins. 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 Right carotid system: Patent. Multifocal calcified and noncalcified plaque along the common carotid with less than 50% stenosis. Mixed plaque is present at the ICA origin causing approximately 60% stenosis. Left carotid system: Patent. Calcified and noncalcified plaque along the common carotid causing less than 50% stenosis. Mixed plaque at the ICA origin causing approximately 50% stenosis. There is subsequent additional mixed plaque causing at least 80% stenosis.  Vertebral arteries: Patent. Right vertebral artery is dominant. Mixed plaque along the left  V2 segment causing moderate stenosis. Skeleton: Degenerative changes of the cervical spine. Other neck: Heterogeneous, enlarged left thyroid, which has been previously evaluated by ultrasound. Upper chest: Small bilateral pleural effusions. Interlobular septal thickening and patchy ground-glass density, which may reflect edema. Review of the MIP images confirms the above findings CTA HEAD FINDINGS Anterior circulation: Intracranial internal carotid arteries are patent with calcified plaque causing moderate stenosis. Anterior and middle cerebral arteries are patent. Posterior circulation: Intracranial vertebral arteries are patent. There is mixed plaque causing mild stenosis on the right and marked stenosis on the left. Patent PICA origins. Basilar artery is patent. Posterior cerebral arteries are patent. Venous sinuses: As permitted by contrast timing, patent. Review of the MIP images confirms the above findings CT Brain Perfusion Findings: CBF (<30%) Volume: 41mL Perfusion (Tmax>6.0s) volume: 61mL Mismatch Volume: 39mL Infarction Location: None IMPRESSION: No acute intracranial hemorrhage. Suspected small acute infarction involving the right precentral gyrus (ASPECT score 9). No large vessel occlusion. No evidence of core infarction or territory at risk by perfusion imaging. Plaque along common and internal carotid arteries bilaterally. Approximately 60% stenosis at the right ICA origin. Approximately 50% stenosis at the left ICA origin with subsequent plaque causing 80% stenosis. Moderate stenosis of the intracranial internal carotid arteries. 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 Partially imaged small bilateral pleural effusions and suspected mild pulmonary edema. Initial results were communicated to Dr. Leonel Ramsay at 12:35 pmon 8/18/2021by text page via the [facility] messaging system. Electronically Signed    By: Macy Mis M.D.   On: 06/11/2020 13:02    Assessment and Plan:   1.  New onset systolic heart failure, EF 30%, with wall motion abnormalities -I personally reviewed her echocardiogram and this demonstrates an EF of 30% with akinesis of the mid to apical segments of the septum, anterior wall, lateral wall, inferior wall, apex.  This is wall motion across multiple vascular territories.  Her EKG has no evidence of ST elevation.  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  I suspect she has this in the setting of recent stroke.  I have ordered cardiac enzymes.  For this level of LV dysfunction I would expect her cardiac enzymes to be severely elevated if coronary ischemia/infarction explain this.  Her EKG demonstrates no ST elevation.  Again, I would expect ST elevation infarction to explain her wall motion. -She has had a recent stroke and her contrast echocardiogram demonstrates no LV thrombus. -I have ordered cardiac enzymes.  I do expect them to be elevated.  I will continue to trend them. -I have ordered an EKG to be repeated this evening. -I have transitioned her to metoprolol succinate 50 mg daily -I would recommend to continue her losartan 25 mg daily -She is mildly volume up.  I have increased her Lasix to 80 mg IV twice daily. -She remains on aspirin, Plavix, statin due to recent stroke.  We will continue these. -She has advanced dementia from the notes, recent stroke, poor mobility (not on anticoagulation for A. fib due to falls), and overall medical management would be the best option for her.  I would not recommend aggressive care. 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  She does have a LIMA graft but again, the wall motion abnormalities spanned multiple territories in addition to the LAD territory.  Overall, I still suspect she is having a stress-induced cardiomyopathy.  2.  Paroxysmal atrial fibrillation -EKG  shows normal sinus rhythm.  Telemetry  shows sinus rhythm with PACs.  Not on anticoagulation due to fall risk per her primary cardiologist.  No need to start now.  For questions or updates, please contact [place] Please consult [email or web address] for contact info under   Signed, [place] T. Audie Box, [place]  06/12/2020 5:49 PM

## 2020-06-12 NOTE — Progress Notes (Signed)
RN applied splint brace to patient's left hand

## 2020-06-12 NOTE — Progress Notes (Signed)
RN tried several times to give patient her oral medication Patient would spit the medication out ever time given, patient would say "ok" when told she need to take her medication however once medication in patient mouth she would spit it out.

## 2020-06-12 NOTE — Evaluation (Signed)
Speech Language Pathology Evaluation Patient Details Name: Cheryl Summers MRN: [id number] DOB: 09-05-1941 Today's Date: 06/12/2020 Time: 4268-3419 SLP Time Calculation (min) (ACUTE ONLY): 30 min  Problem List:  Patient Active Problem List   Diagnosis Date Noted  . 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 Wheezing 06/12/2020  . CVA (cerebral vascular accident) ([place]) 06/11/2020  . SOB (shortness of breath)   . Acute deep vein thrombosis (DVT) of left upper extremity ([place])   . Numbness 12/03/2019  . Memory loss 12/03/2019  . Meningioma ([place]) 12/03/2019  . Gait disturbance 12/03/2019  . Diabetic polyneuropathy associated with type 2 diabetes mellitus ([place]) 12/03/2019  . Dizziness 09/18/2019  . Frequent falls 09/18/2019  . Renal insufficiency 06/07/2018  . Hypotension   . Spinal stenosis, lumbar region with neurogenic claudication 06/06/2018    Class: Acute  . Spondylolisthesis, lumbar region 06/06/2018    Class: Chronic  . Status post lumbar spinal fusion 06/06/2018  . Murmur, heart 02/24/2018  . Bradycardia 01/19/2018  . Coronary artery disease involving native coronary artery of native heart with angina pectoris ([place]) 05/29/2017  . Tobacco abuse 05/13/2017  . Hypertension 05/13/2017  . Hyperlipidemia 05/13/2017  . GERD (gastroesophageal reflux disease) 05/13/2017  . COPD (chronic obstructive pulmonary disease) ([place]) 05/13/2017  . Carotid artery stenosis 05/13/2017  . Chronic heart failure with preserved ejection fraction (HFpEF) ([place]) 06/09/2015  . Hypertensive heart disease with heart failure ([place]) 06/09/2015  . PAF (paroxysmal atrial fibrillation) ([place]) 06/09/2015   Past Medical History:  Past Medical History:  Diagnosis Date  . Anxiety   . Carotid artery stenosis, asymptomatic    50% ASYMPTOMATIC  RIGHT CAROTID STENOSIS  . Chronic diastolic heart failure ([place]) 06/09/2015  . COPD (chronic obstructive pulmonary disease) ([place])   . Depression    . Diabetes mellitus without complication ([place])   . Dysrhythmia   . Gastroesophageal reflux   . Hyperlipidemia   . Hypertension   . Hypertensive heart disease with heart failure ([place]) 06/09/2015  . PAF (paroxysmal atrial fibrillation) ([place]) 06/09/2015   Overview:  CHADS2 vasc score=6  . Renal insufficiency   . Tobacco abuse   . Tremors of nervous system    Past Surgical History:  Past Surgical History:  Procedure Laterality Date  . ABDOMINAL HYSTERECTOMY    . CATARACT EXTRACTION  2016  . CORONARY ARTERY BYPASS GRAFT  2012  . NOSE SURGERY     HPI:  79 y.o. female presenting with unilateral left-sided weakness found to have CVA, outside of tPA window. PMH is significant for mild dementia, CAD s/p CABG, T2DM, HTN, HLD, paroxysmal A. Fib, COPD, HFpEF.  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  CXR right more than left side airspace disease, small effusions, suggests CHF.   Assessment / Plan / Recommendation Clinical Impression  Currently pt presents with severe cognitive linguistic deficits - known dementia prior to admit -- but family is not present to establish baseline.  Pt demonstrates veyr impaired sustained attention which negatively impacts her participation in ADLs including feedingi herself .  She is oriented to her first name only, not birthday, current location nor reasoning for hospital admit.  Expressive and receptive language deficits apparent however pt able to answer basic questions that pertain to her personal need- eg: hunger, thirst.  She however does not leave on her oxygen - and does not articulate reasoning for its removal.  She also does not recall within 5 seconds reasoning for oxygen use.  Automatic speech skilils such as singing, counting, etc was more fluent that novel communication.  Pt does not iniitate verbal communication and answered approx 20% of SLP questions but can repeat single words fluently when conducted.  SLP does not suspect pt with  motor planning speech deficits rather cognitive based.  She will benefit from 24/7 supervision due to extensive cognitive linguistic impairments. SLP will follow up with pt/family to establish baseline and educate to compensation strategies as pt's current cognition does not allow for adequate education.    SLP Assessment  SLP Recommendation/Assessment: Patient needs continued Speech Lanaguage Pathology Services SLP Visit Diagnosis: Cognitive communication deficit (R41.841)    Follow Up Recommendations  Skilled Nursing facility    Frequency and Duration min 1 x/week  1 week      SLP Evaluation Cognition  Arousal/Alertness: Awake/alert Orientation Level: Oriented to person;Disoriented to place;Disoriented to time;Disoriented to situation Attention: Sustained Sustained Attention: Impaired Memory: Impaired Memory Impairment: Storage deficit Awareness: Impaired Problem Solving: Impaired Safety/Judgment: Impaired Comments: pt does not recall to use the call bell within 20 seconds of demonstrating to her- uncertain to baseline       Comprehension  Auditory Comprehension Overall Auditory Comprehension: Impaired Yes/No Questions: Not tested Commands: Impaired One Step Basic Commands: 50-74% accurate Conversation: Simple Interfering Components: Attention;Motor planning;Processing speed EffectiveTechniques: Visual/Gestural cues;Repetition;Slowed speech Visual Recognition/Discrimination Discrimination: Not tested Reading Comprehension Reading Status: Impaired (pt did not recognize her name from written choice of two- large print but she read them aloud)    Expression Expression Primary Mode of Expression: Verbal Verbal Expression Overall Verbal Expression: Impaired (unknown baseline) Repetition: Impaired Level of Impairment: Word level Naming: Impairment Responsive: 0-25% accurate (0/2 named correctly, with verbal choice of two - pt able to name correctly) Other Naming Comments:  automatic tasks including counting and singing with 75% fluent with pause at last 1/3 of task but pt able to recover independently, pt did not communicate beyond short phrases or single words Verbal Errors: Not aware of errors Non-Verbal Means of Communication: Not applicable Written Expression Dominant Hand: Right Written Expression: Not tested   Oral / Motor  Oral Motor/Sensory Function Overall Oral Motor/Sensory Function: Mild impairment Facial ROM: Suspected CN VII (facial) dysfunction;Reduced left Facial Sensation: Reduced left;Suspected CN V (Trigeminal) dysfunction Lingual ROM: Suspected CN XII (hypoglossal) dysfunction Velum: Other (comment) (bilateral) Motor Speech Overall Motor Speech: Appears within functional limits for tasks assessed Respiration: Within functional limits Phonation: Normal Articulation: Within functional limitis Intelligibility: Intelligible Motor Planning: Not tested Motor Speech Errors: Not applicable Interfering Components: Premorbid status   GO                    Macario Golds 06/12/2020, 10:20 AM  Kathleen Lime, MS [place] Office [phone]

## 2020-06-12 NOTE — Progress Notes (Signed)
Rehab Admissions Coordinator Note:  Patient was screened by Cleatrice Burke for appropriateness for an Inpatient Acute Rehab Consult per therapy recs. .  At this time, we are recommending Inpatient Rehab consult. Please place order if you would like patient considered for Cir admit. Please advise.  Cleatrice Burke RN MSN 06/12/2020, 12:21 PM  I can be reached at [phone].

## 2020-06-12 NOTE — Plan of Care (Signed)

## 2020-06-12 NOTE — Progress Notes (Signed)
Orthopedic Tech Progress Note Patient Details:  Cheryl Summers February 13, 1941 [id number] Ordered outside vendor brace. Patient ID: Marzetta Merino, female   DOB: 10/29/1940, 79 y.o.   MRN: [id number]   Tammy Sours 06/12/2020, 1:30 PM

## 2020-06-12 NOTE — Evaluation (Addendum)
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 MRI reveals Acute infarct right frontal parietal lobe extending to the occipital lobe. This is a watershed territory infarct without associated hemorrhage.    Clinical Impression  Cheryl Summers is 79 y.o. female admitted with above HPI and diagnosis. Patient is currently limited by functional impairments below (see PT problem list). Patient lives alone and was independent PTA. Patient currently is limitedby Lt inattention, Lt LE weakness/hemiplegia, impaired bil LE coordination, and cognitive deficits. She required Max-Total assist for functional bed mobility and Mod-Max assist to maintain seated balance at EOB with LE/UE support. Patient will benefit from continued skilled PT interventions to address impairments and progress independence with mobility, recommending intense CIR rehab follow up at this time to progress towards PLOF. Acute PT will follow and progress as able.     Follow Up Recommendations CIR    Equipment Recommendations  Other (comment) (TBA)    Recommendations for Other Services Rehab consult     Precautions / Restrictions Precautions Precautions: Fall Restrictions Weight Bearing Restrictions: No      Mobility  Bed Mobility Overal bed mobility: Needs Assistance Bed Mobility: Supine to Sit;Sit to Supine     Supine to sit: Total assist;+2 for physical assistance;+2 for safety/equipment;HOB elevated Sit to supine: Total assist;+2 for physical assistance;+2 for safety/equipment   General bed mobility comments: Pt unable to follow cues (visual/tactile) to sequence Rt LE mobility to EOB. Max assist required to bring Bil LE's off EOB.  Pt unabel to reach Lt LE to rail and Total assist required to raise trunk up to sit EOB. Max assist to scoot hips an dplace feet on floor for support as well as position UE's for support.   Transfers         Ambulation/Gait       Stairs     Wheelchair Mobility    Modified Rankin (Stroke Patients Only)       Balance Overall balance assessment: Needs assistance Sitting-balance support: Feet supported;Bilateral upper extremity supported Sitting balance-Leahy Scale: Zero Sitting balance - Comments: pt fluctuating between mod-max assist for seated balance with Bil UE support.  Postural control: Left lateral lean;Posterior lean   Standing balance-Leahy Scale: Zero Standing balance comment: unsafe to test at this time              Pertinent Vitals/Pain Pain Assessment: Faces Faces Pain Scale: Hurts a little bit Pain Location: general discomfort with mobilizing Pain Descriptors / Indicators: Discomfort;Moaning Pain Intervention(s): Limited activity within patient's tolerance;Monitored during session;Repositioned    Home Living Family/patient expects to be discharged to:: Private residence Living Arrangements: Alone Available Help at Discharge: Family;Available PRN/intermittently (pts son and daughter both work ) Type of Home: House Home Access: [place]: [place]: Other (comment) (son unsure ) Additional Comments: 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 She does not drive and her daughter takes her grocery shopping weekly.     Prior Function Level of Independence: Independent         Comments: pt's son reprots she was independent with ADL's and mobility in home  but is unsure how she was completing all tasks. She had a fall last week in the yard. She has had help/aids brought in previously but has not kept them around to help.      Hand Dominance   Dominant Hand:  Right    Extremity/Trunk Assessment   Upper Extremity Assessment Upper Extremity Assessment: Defer to OT evaluation    Lower Extremity Assessment Lower Extremity Assessment: RLE deficits/detail;LLE deficits/detail RLE Deficits / Details: no volitional movement on Rt LE to follow simpe commands. Pt with extensor tone/resistant to PROM for knee flexion.  RLE Sensation: decreased proprioception RLE Coordination: decreased gross motor LLE Deficits / Details: 0/5 for knee ext/flex, and ankle dorsi/plantar flexion with. no tone noted in Lt LE with PROM.  LLE Sensation: decreased proprioception LLE Coordination: decreased gross motor    Cervical / Trunk Assessment Cervical / Trunk Assessment: Other exceptions;Kyphotic Cervical / Trunk Exceptions: flexed/slouched posture and significant Lt lateral lean secondary to Lt inattention  Communication   Communication: Expressive difficulties;Receptive difficulties  Cognition Arousal/Alertness: Awake/alert Behavior During Therapy: Flat affect Overall Cognitive Status: Impaired/Different from baseline Area of Impairment: Orientation;Attention;Safety/judgement;Awareness;Problem solving;Memory;Following commands          Orientation Level: Disoriented to;Place;Time;Situation;Person Current Attention Level: Focused Memory: Decreased short-term memory Following Commands:  (unable to follow commands at this time) Safety/Judgement: Decreased awareness of deficits;Decreased awareness of safety Awareness: Intellectual Problem Solving: Slow processing;Requires verbal cues;Requires tactile cues;Difficulty sequencing;Decreased initiation        General Comments      Exercises     Assessment/Plan    PT Assessment Patient needs continued PT services  PT Problem List Decreased strength;Decreased range of motion;Decreased activity tolerance;Decreased balance;Decreased mobility;Decreased coordination;Decreased cognition;Decreased knowledge of use of  DME;Decreased safety awareness;Impaired tone       PT Treatment Interventions DME instruction;Gait training;Functional mobility training;Therapeutic activities;Therapeutic exercise;Balance training;Patient/family education;Neuromuscular re-education    PT Goals (Current goals can be found in the Care Plan section)  Acute Rehab PT Goals Patient Stated Goal: none stated PT Goal Formulation: With patient/family Time For Goal Achievement: 06/26/20 Potential to Achieve Goals: Good    Frequency Min 4X/week   Barriers to discharge Decreased caregiver support pt currently has intermittent support, family unable to provide 24/7 assist at this time.    Co-evaluation PT/OT/SLP Co-Evaluation/Treatment: Yes Reason for Co-Treatment: Complexity of the patient's impairments (multi-system involvement);For patient/therapist safety;To address functional/ADL transfers;Necessary to address cognition/behavior during functional activity PT goals addressed during session: Mobility/safety with mobility;Balance OT goals addressed during session: ADL's and self-care       AM-PAC PT "6 Clicks" Mobility  Outcome Measure Help needed turning from your back to your side while in a flat bed without using bedrails?: Total Help needed moving from lying on your back to sitting on the side of a flat bed without using bedrails?: Total Help needed moving to and from a bed to a chair (including a wheelchair)?: Total Help needed standing up from a chair using your arms (e.g., wheelchair or bedside chair)?: Total Help needed to walk in hospital room?: Total Help needed climbing 3-5 steps with a railing? : Total 6 Click Score: 6    End of Session Equipment Utilized During Treatment: Oxygen (pt saturating at 93% on RA) Activity Tolerance: Patient tolerated treatment well Patient left: in bed;with call bell/phone within reach;with bed alarm set;with family/visitor present Nurse Communication: Mobility status;Need for  lift equipment;Other (comment) (pt on RA) PT Visit Diagnosis: Muscle weakness (generalized) (M62.81);Other abnormalities of gait and mobility (R26.89);Hemiplegia and  hemiparesis;History of falling (Z91.81);Difficulty in walking, not elsewhere classified (R26.2);Other symptoms and signs involving the nervous system (R29.898) Hemiplegia - Right/Left: Left Hemiplegia - dominant/non-dominant: Non-dominant Hemiplegia - caused by: Cerebral infarction    Time: 0048-4986 PT Time Calculation (min) (ACUTE ONLY): 30 min   Charges:   PT Evaluation $PT Eval Moderate Complexity: 1 Mod         Verner Mould, DPT Acute Rehabilitation Services  Office [phone] Pager [phone]  06/12/2020 11:56 AM

## 2020-06-12 NOTE — Progress Notes (Addendum)
Family Medicine Teaching Service Daily Progress Note Intern Pager: [phone]  Patient name: Cheryl Summers Medical record number: [id number] Date of birth: Jun 04, 1941 Age: 79 y.o. Gender: female  Primary Care Provider: Nicholos Johns, MD Consultants: Neurology   Code Status: DNR  Pt Overview and Major Events to Date:  8/18 Admitted  Assessment and Plan: Cheryl Summers is a 79 y.o. female presenting with unilateral left-sided weakness found to have CVA, outside of tPA window. PMH is significant for mild dementia, CAD s/p CABG, T2DM, HTN, HLD, paroxysmal A. Fib, COPD, HFpEF.  CVA Acute left-sided weakness with evidence of right precentral gyrus infarct on imaging and exam, outside of tPA window. NIHSS 9 on presentation. CT head without contrast showed no evidence of intracranial hemorrhage.  CTA head and neck showed evidence of small acute infarct of the right precentral gyrus. MRI with  Exam with left-sided weakness, UE > LE, with mild facial droop, consistent with radiographic findings. - Neurology following, appreciate involvement - ASA 325 mg daily + clopidogrel 75 mg daily x 3 months (then plan for clopidogrel alone) per neuro - increase atorvastatin 80 mg - permissive HTN up to 220/120 per neuro - cardiac monitoring - frequent neuro checks - PT/OT - SLP  HFpEF In acute exacerbation, with dyspnea worse than baseline and pitting edema. Also with CXR suggestive of CHF and elevated BNP over 2100 compared to 415 on 8/2. Followed by cardiology.  Recently reduced torsemide dose due to elevated Cr. Prior echo 09/24/19 with EF 60-65%, normal LV/RV function, mild aortic valve sclerosis without stenosis. TTE this admission with EF 30-35% with diffuse akinesis and severely decreased LV function. Home meds: torsemide 20 mg MWF - IV furosemide 80 mg once, then reassess - consult cards in AM - PM BMP   COPD Diffuse wheezing on exam audible without stethoscope.  Patient with history of COPD.  Unclear if wheeze of COPD or cardiac wheeze of CHF. Home meds: albuterol inhaler prn, albuterol neb prn, Symbicort, montelukast, torsemide.  - Dulera (formulary) - Duoneb sch  - albuterol neb prn - montelukast  HTN Home meds: telmisartan, amlodipine - resuming clonidine due to concerns for rebound HTN - holding other home meds, permissive HTN  Recent Falls  Weakness Patient with history of falls, thought to be multifactorial including gait disturbances and neuropathy.  At baseline ambulates with a cane. Followed by Dr. Felecia Summers. - PT/OT  ?LUE DVT Apparently unable to check blood pressure in the right arm due to "a small blockage on the left arm". Initially unclear if LUE DVT or PAD, but upon further chart review, patient has left subclavian artery stenosis without steal or coronary ischemia. Patient does not have LUE DVT.  CKD Apparently creatinine of 2.7 at cardiologist office 2 weeks ago.  Patient's creatinine on admission 2.07, GFR 22.  Unclear what baseline is, but improved from previous reading 2 weeks prior to admission.  Likely CKD in the setting of T2DM and chronic HTN. - renally adjust medications - avoid nephrotoxic meds  - monitor I/O  Dementia/Memory Loss  Most recent MMSE 16 weeks ago, followed by Dr. Felecia Summers. Home meds: Aricept and Namenda.  - continue home meds  Meningioma As appreciated on recent MRI 8/12, appears to be stable on MRI this admission. Seen by neurology 8/16.   T2DM A1c 5.8% this admission. Glucose 90 on admission.  Appears to be well controlled. Home meds: [place].  Afib Patient with history of atrial fibrillation.  She is not anticoagulated due to gait  dysfunction, high risk of falls. -Cardiac monitoring   CAD History of CABG in 2012.  Stable, no chest pain.  HLD Home meds: atorvastatin, ezetimibe. LDL 83, goal < 70. - atorvastatin, ezetimibe  Lumbago Patient with history of low back pain, takes pregabalin. - continue  pregabalin  GERD Taking omeprazole at home. - pantoprazole  Goals of Care Lives alone.  Will likely have increased dependence following stroke. - palliative care consult  FEN/GI: DYS 1, advance per SLP PPx: [facility]  Disposition: med-surg, CIR when discharged  Subjective:  NAOE.  Son feels like she has increased back pain, chronic and s/p back surgeries.  He states she has been pulling out the nasal cannula.  He states her breathing has been improving with breathing treatments.  Interested in palliative care.  Objective: Temp:  [98.4 F (36.9 C)-99.7 F (37.6 C)] 99.7 F (37.6 C) (08/19 0335) Pulse Rate:  [93-102] 102 (08/19 0335) Resp:  [17-30] 20 (08/19 0335) BP: (134-178)/(61-106) 147/76 (08/19 0335) SpO2:  [88 %-100 %] 88 % (08/19 0549) Weight:  [86.6 kg-90.7 kg] 86.6 kg (08/19 0500) Physical Exam: General: Alert, elderly woman lying in bed, hard of hearing, mild distress Cardiovascular: Tachycardic, regular rhythm, no murmurs Respiratory: Diffuse wheezes audible without stethoscope, labored respirations on room air, [place] displaced Abdomen: soft, non-tender Extremities: WWP, 1+ pitting edema Neuro: alert and oriented to self and location only (not year or situation), difficulty understanding commands, unable to lift up left arm  Laboratory: Recent Labs  Lab 06/11/20 1211  WBC 19.0*  HGB 13.0  13.3  HCT 42.7  39.0  PLT 237   Recent Labs  Lab 06/11/20 1211  NA 145  144  K 4.6  4.3  CL 111  113*  CO2 18*  BUN 28*  31*  CREATININE 1.89*  1.80*  CALCIUM 9.5  PROT 7.2  BILITOT 0.7  ALKPHOS 97  ALT 25  AST 40  GLUCOSE 112*  112*    Imaging/Diagnostic Tests: MR BRAIN WO CONTRAST  Result Date: 06/11/2020 CLINICAL DATA:  Stroke.  Left-sided weakness. EXAM: MRI HEAD WITHOUT CONTRAST TECHNIQUE: Multiplanar, multiecho pulse sequences of the brain and surrounding structures were obtained without intravenous contrast. COMPARISON:  CT angio head and neck 8 18  2021.  MRI head 06/05/2020 FINDINGS: Brain: Acute infarct in the right frontal parietal lobe extending to the occipital lobe. This is a watershed territory infarct. This was not present on the recent MRI of 06/05/2020. Calcific extra-axial mass in the left posterior parietal lobe measuring 18 mm compatible with meningioma. No brain edema. Generalized atrophy with mild chronic microvascular ischemic change in the white matter and pons. Chronic microhemorrhage in the left middle frontal lobe. Vascular: Normal arterial flow voids with stenosis in the distal left vertebral artery. Skull and upper cervical spine: No focal skeletal lesion. Sinuses/Orbits: Mild mucosal edema paranasal sinuses. Bilateral cataract extraction Other: None IMPRESSION: Acute infarct right frontal parietal lobe extending to the occipital lobe. This is a watershed territory infarct without associated hemorrhage. 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 No brain edema. Electronically Signed   By: Franchot Gallo M.D.   On: 06/11/2020 18:02   MR BRAIN W WO CONTRAST  Result Date: 06/06/2020  [facility] [address], [place], [place] [address] [phone] NEUROIMAGING REPORT STUDY DATE: 06/05/2020 PATIENT NAME: TAILEY TOP DOB: 02-28-1941 MRN: [id number] EXAM: MRI Brain with and without contrast ORDERING CLINICIAN: Ellysa Parrack A. Cheryl Shelling, MD. PhD  CLINICAL HISTORY: 79 year old woman with memory loss and gait disturbance and probable meningioma noted on CT scan COMPARISON FILMS: CT 09/24/2019 TECHNIQUE:MRI of the brain with and without contrast was obtained utilizing 5 mm axial slices with T1, T2, T2 flair, SWI and diffusion weighted views.  T1 sagittal, T2 coronal and postcontrast views in the axial and coronal plane were obtained. CONTRAST: 15 ml Multihance IMAGING SITE: [facility], [place]. FINDINGS: On sagittal images, the spinal  cord is imaged caudally to C2 and is normal in caliber.   The contents of the posterior fossa are of normal size and position.   The pituitary gland and optic chiasm appear normal.    There is mild to moderate generalized cortical atrophy.  The pattern is nonspecific.  There are no abnormal extra-axial collections of fluid.  There is an extra-axial mass adjacent to the left occipital lobe that appears to be dural based.  There is a thick rim enhancement around a hypointense center on post contrasted T1-weighted images.  The center is also hypointense on T2-weighted images.  It measures 19 x 12 x 17 mm (major transverse axis x AP X height).  The signal is most consistent with a heavily calcified meningioma and appears unchanged compared to the 09/24/2019 CT scan. T2/FLAIR hyperintense foci noted within the pons.  The rest of the brainstem appears normal.  The cerebellum appears normal.  The deep gray matter appears normal.  In the hemispheres there are multiple T2/FLAIR hyperintense foci predominantly in the subcortical deep white matter.  None of these appear to be acute.  They do not enhance.  They are most consistent with chronic microvascular ischemic changes.   Diffusion weighted images are normal.  Susceptibility weighted images are normal.   There have been bilateral lens replacements.  Otherwise, the orbits appear normal.   The VIIth/VIIIth nerve complex appears normal.  The mastoid air cells appear normal.  Mucoperiosteal thickening is noted within a couple of the ethmoid air cells.  The other paranasal sinuses appear normal.  Flow voids are identified within the major intracerebral arteries.  After the infusion of contrast material, a normal enhancement pattern is noted.   This MRI of the brain with and without contrast shows the following: 1.   Mild to moderate generalized cortical atrophy.  The pattern is not nonspecific. 2.   Multiple T2/FLAIR hyperintense foci in the hemispheres and pons in a pattern  configuration consistent with chronic microvascular ischemic change. 3.   Heavily calcified dural based extra-axial enhancing mass adjacent to the left cerebral lobe consistent with a meningioma, measuring 19 mm in maximum diameter, unchanged compared to the previous CT scan. 4.   There are no acute findings and there is a normal enhancement pattern. INTERPRETING PHYSICIAN: Kaniyah Lisby A. Cheryl Shelling, MD, PhD, FAAN Certified in  Neuroimaging by [employer]   MR [place]  Result Date: 06/06/2020  [facility] [address] [place], [place] [address] [phone] NEUROIMAGING REPORT STUDY DATE: 06/05/2020 PATIENT NAME: ARELYN GAUER DOB: 1940-12-30 MRN: [id number] EXAM: MRI of the cervical spine ORDERING CLINICIAN: Kraig Genis A. Sater, MD. PhD CLINICAL HISTORY: 79 year old woman with gait disturbance and numbness COMPARISON FILMS: None TECHNIQUE: MRI of the cervical spine was obtained utilizing 3 mm sagittal slices from the posterior fossa down to the T3-4 level with T1, T2 and inversion recovery views. In addition 4 mm axial slices from V7-8 down to T1-2 level were included with T2 and gradient  echo views. CONTRAST: None IMAGING SITE: [facility], [place], [place], [place] FINDINGS: :  On sagittal images, the spine is imaged from above the cervicomedullary junction to T2.   Visible brain appears normal.  Paravertebral soft tissue appears normal.  The spinal cord is of normal caliber and signal.   The vertebral bodies are normally aligned.   The vertebral bodies have normal signal.  The discs and interspaces were further evaluated on axial views from C2 to T1 as follows: C2-C3: There is mild left facet hypertrophy.  The disc is normal and there is no nerve root compression or spinal stenosis. C3-C4: There is mild left facet hypertrophy.  The disc is normal.  There is no nerve root compression or spinal stenosis. C4-C5: There is a small right  paramedian disc herniation indenting the thecal sac and combining with mild ligamenta flava hypertrophy causing moderate spinal stenosis and mild right foraminal narrowing.  There is no nerve root compression. C5-C6: There is disc bulging and mild uncovertebral spurring causing mild bilateral foraminal narrowing but no nerve root compression or spinal stenosis. C6-C7: The disc appears normal.  There is mild right uncovertebral spurring.  There is mild right foraminal narrowing but no nerve root compression or spinal stenosis. C7-T1: The disc interspace appear normal.   This MRI of the cervical spine without contrast shows the following: 1.   The spinal cord appears normal. 2.   At C4-C5, there is a small right paramedian disc herniation causing moderate spinal stenosis and mild right foraminal narrowing but no nerve root compression. 3.   Minimal to mild degenerative changes at other levels as detailed above that do not lead to nerve root compression or spinal stenosis. INTERPRETING PHYSICIAN: Bartley Vuolo A. Cheryl Shelling, MD, PhD, FAAN Certified in  Neuroimaging by  [employer]   CT CEREBRAL PERFUSION W CONTRAST  Result Date: 06/11/2020 CLINICAL DATA:  Left-sided weakness EXAM: CT HEAD CODE STROKE CT ANGIOGRAPHY HEAD AND NECK CT PERFUSION BRAIN TECHNIQUE: Multidetector CT imaging of the head and neck was performed using the standard protocol during bolus administration of intravenous contrast. Multiplanar CT image reconstructions and MIPs were obtained to evaluate the vascular anatomy. Carotid stenosis measurements (when applicable) are obtained utilizing NASCET criteria, using the distal internal carotid diameter as the denominator. Multiphase CT imaging of the brain was performed following IV bolus contrast injection. Subsequent parametric perfusion maps were calculated using RAPID software. CONTRAST:  100 mL Omnipaque 350 COMPARISON:  CT head 09/24/2019, MRI head 06/05/2020 FINDINGS: CT HEAD  FINDINGS Brain: There is no acute intracranial hemorrhage or mass effect. There is a small area of hypoattenuation with loss of gray-white differentiation in the right frontal lobe involving the precentral gyrus. Additional patchy hypoattenuation in the supratentorial white matter is nonspecific but may reflect mild to moderate chronic microvascular ischemic changes. Prominence of the ventricles and sulci reflects generalized parenchymal volume loss. 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 Vascular: No hyperdense vessel. Skull: Unremarkable. Sinuses/Orbits: Retained secretions within the right sphenoid. Patchy ethmoid mucosal thickening. No acute orbital finding. Other: Mastoid air cells are clear. ASPECTS [facility] Stroke Program Early CT Score) - Ganglionic level infarction (caudate, lentiform nuclei, internal capsule, insula, M1-M3 cortex): 7 - Supraganglionic infarction (M4-M6 cortex): 2 Total score (0-10 with 10 being normal): 9 Review of the MIP images confirms the above findings CTA NECK FINDINGS Aortic arch: Calcified and noncalcified plaque along the arch and great vessel origins. There is severe  stenosis of the proximal left subclavian. Right carotid system: Patent. Multifocal calcified and noncalcified plaque along the common carotid with less than 50% stenosis. Mixed plaque is present at the ICA origin causing approximately 60% stenosis. Left carotid system: Patent. Calcified and noncalcified plaque along the common carotid causing less than 50% stenosis. Mixed plaque at the ICA origin causing approximately 50% stenosis. There is subsequent additional mixed plaque causing at least 80% stenosis. Vertebral arteries: Patent. Right vertebral artery is dominant. Mixed plaque along the left V2 segment causing moderate stenosis. Skeleton: Degenerative changes of the cervical spine. Other neck: Heterogeneous, enlarged left thyroid, which has been  previously evaluated by ultrasound. Upper chest: Small bilateral pleural effusions. Interlobular septal thickening and patchy ground-glass density, which may reflect edema. Review of the MIP images confirms the above findings CTA HEAD FINDINGS Anterior circulation: Intracranial internal carotid arteries are patent with calcified plaque causing moderate stenosis. Anterior and middle cerebral arteries are patent. Posterior circulation: Intracranial vertebral arteries are patent. There is mixed plaque causing mild stenosis on the right and marked stenosis on the left. Patent PICA origins. Basilar artery is patent. Posterior cerebral arteries are patent. Venous sinuses: As permitted by contrast timing, patent. Review of the MIP images confirms the above findings CT Brain Perfusion Findings: CBF (<30%) Volume: 72mL Perfusion (Tmax>6.0s) volume: 29mL Mismatch Volume: 56mL Infarction Location: None IMPRESSION: No acute intracranial hemorrhage. Suspected small acute infarction involving the right precentral gyrus (ASPECT score 9). No large vessel occlusion. No evidence of core infarction or territory at risk by perfusion imaging. Plaque along common and internal carotid arteries bilaterally. Approximately 60% stenosis at the right ICA origin. Approximately 50% stenosis at the left ICA origin with subsequent plaque causing 80% stenosis. Moderate stenosis of the intracranial internal carotid arteries. 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 Partially imaged small bilateral pleural effusions and suspected mild pulmonary edema. Initial results were communicated to Dr. Leonel Ramsay at 12:35 pmon 8/18/2021by text page via the [facility] messaging system. Electronically Signed   By: Macy Mis M.D.   On: 06/11/2020 13:02   DG Chest Port 1 View  Result Date: 06/11/2020 CLINICAL DATA:  Onset shortness of breath and weakness is morning. EXAM: PORTABLE CHEST 1 VIEW COMPARISON:  PA and lateral chest 09/17/2019.  FINDINGS: Extensive bilateral airspace disease is somewhat worse on the right. There are small bilateral pleural effusions. Heart size is upper normal. The patient is status post CABG. IMPRESSION: Right greater than left airspace disease and small effusions have an appearance most suggestive of congestive failure rather than pneumonia. Electronically Signed   By: Inge Rise M.D.   On: 06/11/2020 13:52   ECHOCARDIOGRAM COMPLETE  Result Date: 06/12/2020    ECHOCARDIOGRAM REPORT   Patient Name:   DARIELYS GIGLIA Date of Exam: 06/12/2020 Medical Rec #:  [id number]      Height:       62.0 in Accession #:    [id number]     Weight:       190.9 lb Date of Birth:  Jan 17, 1941      BSA:          1.874 m Patient Age:    41 years       BP:           147/76 mmHg Patient Gender: F              HR:           110 bpm. Exam Location:  Inpatient Procedure: 2D  Echo, 3D Echo, Cardiac Doppler and Color Doppler REPORT CONTAINS CRITICAL RESULT Indications:    Stroke  History:        Patient has prior history of Echocardiogram examinations, most                 recent 09/24/2019. Abnormal ECG, Stroke and COPD,                 Arrythmias:Atrial Fibrillation, Signs/Symptoms:Murmur, Altered                 Mental Status, Dyspnea and Shortness of Breath; Risk                 Factors:Hypertension and Current Smoker.  Sonographer:    Roseanna Rainbow RDCS Referring Phys: [id number] DAN FLOYD IMPRESSIONS  1. LVEF is depressed with akinesis of the mid/distal septal, mid/distal inferior, distal anterior, distal lateral and apical walls. COmpared to echo in 2020, these changes are new.. Left ventricular ejection fraction, by estimation, is 30 to 35%. The left ventricle has moderate to severely decreased function. There is mild left ventricular hypertrophy. Left ventricular diastolic parameters are indeterminate.  2. Right ventricular systolic function is normal. The right ventricular size is normal. There is severely elevated pulmonary artery systolic  pressure.  3. Left atrial size was mildly dilated.  4. The mitral valve is normal in structure. Mild mitral valve regurgitation.  5. Tricuspid valve regurgitation is mild to moderate.  6. The aortic valve is abnormal. Aortic valve regurgitation is trivial. Mild aortic valve sclerosis is present, with no evidence of aortic valve stenosis. FINDINGS  Left Ventricle: LVEF is depressed with akinesis of the mid/distal septal, mid/distal inferior, distal anterior, distal lateral and apical walls. COmpared to echo in 2020, these changes are new. Left ventricular ejection fraction, by estimation, is 30 to  35%. The left ventricle has moderate to severely decreased function. The left ventricle demonstrates regional wall motion abnormalities. Definity contrast agent was given IV to delineate the left ventricular endocardial borders. The left ventricular internal cavity size was normal in size. There is mild left ventricular hypertrophy. Left ventricular diastolic parameters are indeterminate. Right Ventricle: The right ventricular size is normal. No increase in right ventricular wall thickness. Right ventricular systolic function is normal. There is severely elevated pulmonary artery systolic pressure. The tricuspid regurgitant velocity is 3.65 m/s, and with an assumed right atrial pressure of 15 mmHg, the estimated right ventricular systolic pressure is 23.7 mmHg. Left Atrium: Left atrial size was mildly dilated. Right Atrium: Right atrial size was normal in size. Pericardium: Trivial pericardial effusion is present. Mitral Valve: The mitral valve is normal in structure. Mild mitral valve regurgitation. Tricuspid Valve: The tricuspid valve is normal in structure. Tricuspid valve regurgitation is mild to moderate. Aortic Valve: The aortic valve is abnormal. Aortic valve regurgitation is trivial. Mild aortic valve sclerosis is present, with no evidence of aortic valve stenosis. Pulmonic Valve: The pulmonic valve was grossly  normal. Pulmonic valve regurgitation is mild to moderate. Aorta: The aortic root is normal in size and structure. IAS/Shunts: The interatrial septum was not assessed.  LEFT VENTRICLE PLAX 2D LVIDd:         4.50 cm      Diastology LVIDs:         2.90 cm      LV e' lateral:   7.40 cm/s LV PW:         1.20 cm      LV E/e' lateral: 15.2 LV IVS:  1.30 cm      LV e' medial:    5.77 cm/s LVOT diam:     1.70 cm      LV E/e' medial:  19.5 LV SV:         40 LV SV Index:   21 LVOT Area:     2.27 cm  LV Volumes (MOD) LV vol d, MOD A2C: 101.0 ml LV vol d, MOD A4C: 82.7 ml LV vol s, MOD A2C: 46.3 ml LV vol s, MOD A4C: 55.7 ml LV SV MOD A2C:     54.7 ml LV SV MOD A4C:     82.7 ml LV SV MOD BP:      42.2 ml RIGHT VENTRICLE             IVC RV S prime:     10.70 cm/s  IVC diam: 1.80 cm TAPSE (M-mode): 1.5 cm LEFT ATRIUM             Index       RIGHT ATRIUM           Index LA diam:        4.40 cm 2.35 cm/m  RA Area:     14.60 cm LA Vol (A2C):   76.4 ml 40.76 ml/m RA Volume:   35.00 ml  18.67 ml/m LA Vol (A4C):   58.1 ml 31.00 ml/m LA Biplane Vol: 70.6 ml 37.67 ml/m  AORTIC VALVE             PULMONIC VALVE LVOT Vmax:   112.00 cm/s PR End Diast Vel: 3.17 msec LVOT Vmean:  73.400 cm/s LVOT VTI:    0.175 m  AORTA Ao Root diam: 3.00 cm Ao Asc diam:  3.15 cm MITRAL VALVE                TRICUSPID VALVE MV Area (PHT): 4.07 cm     TR Peak grad:   53.3 mmHg MV Decel Time: 187 msec     TR Vmax:        365.00 cm/s MR PISA:        1.01 cm MR PISA Radius: 0.40 cm     SHUNTS MV E velocity: 112.50 cm/s  Systemic VTI:  0.18 m MV A velocity: 73.30 cm/s   Systemic Diam: 1.70 cm MV E/A ratio:  1.53 Dorris Carnes MD Electronically signed by Dorris Carnes MD Signature Date/Time: 06/12/2020/12:56:11 PM    Final    CT HEAD CODE STROKE WO CONTRAST  Result Date: 06/11/2020 CLINICAL DATA:  Left-sided weakness EXAM: CT HEAD CODE STROKE CT ANGIOGRAPHY HEAD AND NECK CT PERFUSION BRAIN TECHNIQUE: Multidetector CT imaging of the head and neck was  performed using the standard protocol during bolus administration of intravenous contrast. Multiplanar CT image reconstructions and MIPs were obtained to evaluate the vascular anatomy. Carotid stenosis measurements (when applicable) are obtained utilizing NASCET criteria, using the distal internal carotid diameter as the denominator. Multiphase CT imaging of the brain was performed following IV bolus contrast injection. Subsequent parametric perfusion maps were calculated using RAPID software. CONTRAST:  100 mL Omnipaque 350 COMPARISON:  CT head 09/24/2019, MRI head 06/05/2020 FINDINGS: CT HEAD FINDINGS Brain: There is no acute intracranial hemorrhage or mass effect. There is a small area of hypoattenuation with loss of gray-white differentiation in the right frontal lobe involving the precentral gyrus. Additional patchy hypoattenuation in the supratentorial white matter is nonspecific but may reflect mild to moderate chronic microvascular ischemic changes. Prominence of the ventricles and sulci  reflects generalized parenchymal volume loss. 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 Vascular: No hyperdense vessel. Skull: Unremarkable. Sinuses/Orbits: Retained secretions within the right sphenoid. Patchy ethmoid mucosal thickening. No acute orbital finding. Other: Mastoid air cells are clear. ASPECTS [facility] Stroke Program Early CT Score) - Ganglionic level infarction (caudate, lentiform nuclei, internal capsule, insula, M1-M3 cortex): 7 - Supraganglionic infarction (M4-M6 cortex): 2 Total score (0-10 with 10 being normal): 9 Review of the MIP images confirms the above findings CTA NECK FINDINGS Aortic arch: Calcified and noncalcified plaque along the arch and great vessel origins. 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 Right carotid system: Patent. Multifocal calcified and noncalcified plaque along the common carotid with less than 50%  stenosis. Mixed plaque is present at the ICA origin causing approximately 60% stenosis. Left carotid system: Patent. Calcified and noncalcified plaque along the common carotid causing less than 50% stenosis. Mixed plaque at the ICA origin causing approximately 50% stenosis. There is subsequent additional mixed plaque causing at least 80% stenosis. Vertebral arteries: Patent. Right vertebral artery is dominant. Mixed plaque along the left V2 segment causing moderate stenosis. Skeleton: Degenerative changes of the cervical spine. Other neck: Heterogeneous, enlarged left thyroid, which has been previously evaluated by ultrasound. Upper chest: Small bilateral pleural effusions. Interlobular septal thickening and patchy ground-glass density, which may reflect edema. Review of the MIP images confirms the above findings CTA HEAD FINDINGS Anterior circulation: Intracranial internal carotid arteries are patent with calcified plaque causing moderate stenosis. Anterior and middle cerebral arteries are patent. Posterior circulation: Intracranial vertebral arteries are patent. There is mixed plaque causing mild stenosis on the right and marked stenosis on the left. Patent PICA origins. Basilar artery is patent. Posterior cerebral arteries are patent. Venous sinuses: As permitted by contrast timing, patent. Review of the MIP images confirms the above findings CT Brain Perfusion Findings: CBF (<30%) Volume: 65mL Perfusion (Tmax>6.0s) volume: 98mL Mismatch Volume: 76mL Infarction Location: None IMPRESSION: No acute intracranial hemorrhage. Suspected small acute infarction involving the right precentral gyrus (ASPECT score 9). No large vessel occlusion. No evidence of core infarction or territory at risk by perfusion imaging. Plaque along common and internal carotid arteries bilaterally. Approximately 60% stenosis at the right ICA origin. Approximately 50% stenosis at the left ICA origin with subsequent plaque causing 80% stenosis.  Moderate stenosis of the intracranial internal carotid arteries. 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 Partially imaged small bilateral pleural effusions and suspected mild pulmonary edema. Initial results were communicated to Dr. Leonel Ramsay at 12:35 pmon 8/18/2021by text page via the [facility] messaging system. Electronically Signed   By: Macy Mis M.D.   On: 06/11/2020 13:02   CT ANGIO HEAD CODE STROKE  Result Date: 06/11/2020 CLINICAL DATA:  Left-sided weakness EXAM: CT HEAD CODE STROKE CT ANGIOGRAPHY HEAD AND NECK CT PERFUSION BRAIN TECHNIQUE: Multidetector CT imaging of the head and neck was performed using the standard protocol during bolus administration of intravenous contrast. Multiplanar CT image reconstructions and MIPs were obtained to evaluate the vascular anatomy. Carotid stenosis measurements (when applicable) are obtained utilizing NASCET criteria, using the distal internal carotid diameter as the denominator. Multiphase CT imaging of the brain was performed following IV bolus contrast injection. Subsequent parametric perfusion maps were calculated using RAPID software. CONTRAST:  100 mL Omnipaque 350 COMPARISON:  CT head 09/24/2019, MRI head 06/05/2020 FINDINGS: CT HEAD FINDINGS Brain: There is no acute intracranial hemorrhage or mass effect. There is a  small area of hypoattenuation with loss of gray-white differentiation in the right frontal lobe involving the precentral gyrus. Additional patchy hypoattenuation in the supratentorial white matter is nonspecific but may reflect mild to moderate chronic microvascular ischemic changes. Prominence of the ventricles and sulci reflects generalized parenchymal volume loss. 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 Vascular: No hyperdense vessel. Skull: Unremarkable. Sinuses/Orbits: Retained secretions within the right sphenoid. Patchy ethmoid mucosal thickening. No  acute orbital finding. Other: Mastoid air cells are clear. ASPECTS [facility] Stroke Program Early CT Score) - Ganglionic level infarction (caudate, lentiform nuclei, internal capsule, insula, M1-M3 cortex): 7 - Supraganglionic infarction (M4-M6 cortex): 2 Total score (0-10 with 10 being normal): 9 Review of the MIP images confirms the above findings CTA NECK FINDINGS Aortic arch: Calcified and noncalcified plaque along the arch and great vessel origins. 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 Right carotid system: Patent. Multifocal calcified and noncalcified plaque along the common carotid with less than 50% stenosis. Mixed plaque is present at the ICA origin causing approximately 60% stenosis. Left carotid system: Patent. Calcified and noncalcified plaque along the common carotid causing less than 50% stenosis. Mixed plaque at the ICA origin causing approximately 50% stenosis. There is subsequent additional mixed plaque causing at least 80% stenosis. Vertebral arteries: Patent. Right vertebral artery is dominant. Mixed plaque along the left V2 segment causing moderate stenosis. Skeleton: Degenerative changes of the cervical spine. Other neck: Heterogeneous, enlarged left thyroid, which has been previously evaluated by ultrasound. Upper chest: Small bilateral pleural effusions. Interlobular septal thickening and patchy ground-glass density, which may reflect edema. Review of the MIP images confirms the above findings CTA HEAD FINDINGS Anterior circulation: Intracranial internal carotid arteries are patent with calcified plaque causing moderate stenosis. Anterior and middle cerebral arteries are patent. Posterior circulation: Intracranial vertebral arteries are patent. There is mixed plaque causing mild stenosis on the right and marked stenosis on the left. Patent PICA origins. Basilar artery is patent. Posterior cerebral arteries are patent. Venous sinuses: As permitted by contrast timing, patent.  Review of the MIP images confirms the above findings CT Brain Perfusion Findings: CBF (<30%) Volume: 67mL Perfusion (Tmax>6.0s) volume: 74mL Mismatch Volume: 13mL Infarction Location: None IMPRESSION: No acute intracranial hemorrhage. Suspected small acute infarction involving the right precentral gyrus (ASPECT score 9). No large vessel occlusion. No evidence of core infarction or territory at risk by perfusion imaging. Plaque along common and internal carotid arteries bilaterally. Approximately 60% stenosis at the right ICA origin. Approximately 50% stenosis at the left ICA origin with subsequent plaque causing 80% stenosis. Moderate stenosis of the intracranial internal carotid arteries. 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 Partially imaged small bilateral pleural effusions and suspected mild pulmonary edema. Initial results were communicated to Dr. Leonel Ramsay at 12:35 pmon 8/18/2021by text page via the [facility] messaging system. Electronically Signed   By: Macy Mis M.D.   On: 06/11/2020 13:02   CT ANGIO NECK CODE STROKE  Result Date: 06/11/2020 CLINICAL DATA:  Left-sided weakness EXAM: CT HEAD CODE STROKE CT ANGIOGRAPHY HEAD AND NECK CT PERFUSION BRAIN TECHNIQUE: Multidetector CT imaging of the head and neck was performed using the standard protocol during bolus administration of intravenous contrast. Multiplanar CT image reconstructions and MIPs were obtained to evaluate the vascular anatomy. Carotid stenosis measurements (when applicable) are obtained utilizing NASCET criteria, using the distal internal carotid diameter as the denominator. Multiphase CT imaging of the brain was performed following  IV bolus contrast injection. Subsequent parametric perfusion maps were calculated using RAPID software. CONTRAST:  100 mL Omnipaque 350 COMPARISON:  CT head 09/24/2019, MRI head 06/05/2020 FINDINGS: CT HEAD FINDINGS Brain: There is no acute intracranial hemorrhage or mass effect. There is a  small area of hypoattenuation with loss of gray-white differentiation in the right frontal lobe involving the precentral gyrus. Additional patchy hypoattenuation in the supratentorial white matter is nonspecific but may reflect mild to moderate chronic microvascular ischemic changes. Prominence of the ventricles and sulci reflects generalized parenchymal volume loss. 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 Vascular: No hyperdense vessel. Skull: Unremarkable. Sinuses/Orbits: Retained secretions within the right sphenoid. Patchy ethmoid mucosal thickening. No acute orbital finding. Other: Mastoid air cells are clear. ASPECTS [facility] Stroke Program Early CT Score) - Ganglionic level infarction (caudate, lentiform nuclei, internal capsule, insula, M1-M3 cortex): 7 - Supraganglionic infarction (M4-M6 cortex): 2 Total score (0-10 with 10 being normal): 9 Review of the MIP images confirms the above findings CTA NECK FINDINGS Aortic arch: Calcified and noncalcified plaque along the arch and great vessel origins. 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 Right carotid system: Patent. Multifocal calcified and noncalcified plaque along the common carotid with less than 50% stenosis. Mixed plaque is present at the ICA origin causing approximately 60% stenosis. Left carotid system: Patent. Calcified and noncalcified plaque along the common carotid causing less than 50% stenosis. Mixed plaque at the ICA origin causing approximately 50% stenosis. There is subsequent additional mixed plaque causing at least 80% stenosis. Vertebral arteries: Patent. Right vertebral artery is dominant. Mixed plaque along the left V2 segment causing moderate stenosis. Skeleton: Degenerative changes of the cervical spine. Other neck: Heterogeneous, enlarged left thyroid, which has been previously evaluated by ultrasound. Upper chest: Small bilateral pleural effusions.  Interlobular septal thickening and patchy ground-glass density, which may reflect edema. Review of the MIP images confirms the above findings CTA HEAD FINDINGS Anterior circulation: Intracranial internal carotid arteries are patent with calcified plaque causing moderate stenosis. Anterior and middle cerebral arteries are patent. Posterior circulation: Intracranial vertebral arteries are patent. There is mixed plaque causing mild stenosis on the right and marked stenosis on the left. Patent PICA origins. Basilar artery is patent. Posterior cerebral arteries are patent. Venous sinuses: As permitted by contrast timing, patent. Review of the MIP images confirms the above findings CT Brain Perfusion Findings: CBF (<30%) Volume: 1mL Perfusion (Tmax>6.0s) volume: 9mL Mismatch Volume: 26mL Infarction Location: None IMPRESSION: No acute intracranial hemorrhage. Suspected small acute infarction involving the right precentral gyrus (ASPECT score 9). No large vessel occlusion. No evidence of core infarction or territory at risk by perfusion imaging. Plaque along common and internal carotid arteries bilaterally. Approximately 60% stenosis at the right ICA origin. Approximately 50% stenosis at the left ICA origin with subsequent plaque causing 80% stenosis. Moderate stenosis of the intracranial internal carotid arteries. 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 Partially imaged small bilateral pleural effusions and suspected mild pulmonary edema. Initial results were communicated to Dr. Leonel Ramsay at 12:35 pmon 8/18/2021by text page via the [facility] messaging system. Electronically Signed   By: Macy Mis M.D.   On: 06/11/2020 13:02     Zola Button, MD 06/12/2020, 7:33 AM PGY-1, [place] Intern pager: [phone], text pages welcome

## 2020-06-12 NOTE — Evaluation (Signed)
Clinical/Bedside Swallow Evaluation Patient Details  Name: ANAISA RADI MRN: [id number] Date of Birth: 02/06/1941  Today's Date: 06/12/2020 Time: SLP Start Time (ACUTE ONLY): 3810 SLP Stop Time (ACUTE ONLY): 0951 SLP Time Calculation (min) (ACUTE ONLY): 30 min  Past Medical History:  Past Medical History:  Diagnosis Date  . Anxiety   . Carotid artery stenosis, asymptomatic    50% ASYMPTOMATIC  RIGHT CAROTID STENOSIS  . Chronic diastolic heart failure ([place]) 06/09/2015  . COPD (chronic obstructive pulmonary disease) ([place])   . Depression   . Diabetes mellitus without complication ([place])   . Dysrhythmia   . Gastroesophageal reflux   . Hyperlipidemia   . Hypertension   . Hypertensive heart disease with heart failure ([place]) 06/09/2015  . PAF (paroxysmal atrial fibrillation) ([place]) 06/09/2015   Overview:  CHADS2 vasc score=6  . Renal insufficiency   . Tobacco abuse   . Tremors of nervous system    Past Surgical History:  Past Surgical History:  Procedure Laterality Date  . ABDOMINAL HYSTERECTOMY    . CATARACT EXTRACTION  2016  . CORONARY ARTERY BYPASS GRAFT  2012  . NOSE SURGERY     HPI:  79 y.o. female presenting with unilateral left-sided weakness found to have CVA, outside of tPA window. PMH is significant for mild dementia, CAD s/p CABG, T2DM, HTN, HLD, paroxysmal A. Fib, COPD, HFpEF.  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  CXR right more than left side airspace disease, small effusions, suggests CHF.   Assessment / Plan / Recommendation Clinical Impression  Pt today is lying in bed with her feet toward the right.  She is willing to participate in swallow/speech evaluation and is pleasant.  Pt inconsistently follows directions even with visual cues, but she is able to seal her lips on a straw and spoon.  She presents with mild oral pocketing on left due to facial/trigeminal nerve involvement and clinically appears with delayed swallow.  She also had  significant eructation after minimal intake and confirmed reflux symptoms.  Mastication ability was impaired with increased oral pocketing of solids compared to yogurt.  Did not appear to aspirate if refluxing to pharynx.  Due to her very poor attention and oral manipulation difficulties, initially would advise a dys1/thin diet, however will speak to MD re: pt's eructation. SLP Visit Diagnosis: Dysphagia, oral phase (R13.11)    Aspiration Risk  Moderate aspiration risk    Diet Recommendation Dysphagia 1 (Puree);Thin liquid   Liquid Administration via: Straw;Cup Medication Administration: Whole meds with puree Supervision: Staff to assist with self feeding;Full supervision/cueing for compensatory strategies Compensations: Slow rate;Small sips/bites Postural Changes: Seated upright at 90 degrees;Remain upright for at least 30 minutes after po intake    Other  Recommendations Oral Care Recommendations: Oral care BID   Follow up Recommendations Skilled Nursing facility      Frequency and Duration min 1 x/week          Prognosis Prognosis for Safe Diet Advancement: Good Barriers to Reach Goals: Cognitive deficits      Swallow Study   General Date of Onset: 06/12/20 HPI: 79 y.o. female presenting with unilateral left-sided weakness found to have CVA, outside of tPA window. PMH is significant for mild dementia, CAD s/p CABG, T2DM, HTN, HLD, paroxysmal A. Fib, COPD, HFpEF.  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  CXR right more than left side airspace disease, small effusions, suggests CHF. Type of Study: Bedside Swallow Evaluation Previous Swallow Assessment:  none in system Diet Prior to this Study: NPO Temperature Spikes Noted: No Respiratory Status: Nasal cannula History of Recent Intubation: No Behavior/Cognition: Alert;Doesn't follow directions;Requires cueing;Distractible;Impulsive Oral Cavity Assessment: Within Functional Limits Oral Care Completed by  SLP: No Oral Cavity - Dentition: Edentulous Vision: Impaired for self-feeding Self-Feeding Abilities: Needs assist Patient Positioning: Upright in bed Baseline Vocal Quality: Normal Volitional Cough: Cognitively unable to elicit Volitional Swallow: Unable to elicit    Oral/Motor/Sensory Function Overall Oral Motor/Sensory Function: Mild impairment (pt did not consistently follow directions, able to seal lips on spoon and straw) Facial ROM: Suspected CN VII (facial) dysfunction;Reduced left Facial Sensation: Reduced left;Suspected CN V (Trigeminal) dysfunction Lingual ROM: Suspected CN XII (hypoglossal) dysfunction Lingual Symmetry: Within Functional Limits Lingual Strength: Within Functional Limits Lingual Sensation:  (dnt) Velum: Other (comment) (bilateral)   Ice Chips Ice chips: Within functional limits Presentation: Spoon   Thin Liquid Thin Liquid: Impaired Presentation: Straw;Cup;Self Fed Pharyngeal  Phase Impairments: Suspected delayed Swallow Other Comments: mild wheeze after intake    Nectar Thick Nectar Thick Liquid: Impaired Presentation: Cup;Straw;Self Fed   Honey Thick Honey Thick Liquid: Not tested   Puree Puree: Within functional limits Presentation: Spoon   Solid     Solid: Impaired Oral Phase Impairments: Impaired mastication;Poor awareness of bolus Oral Phase Functional Implications: Prolonged oral transit;Left lateral sulci pocketing;Oral residue Pharyngeal Phase Impairments: Suspected delayed Swallow      Macario Golds 06/12/2020,10:44 AM  Kathleen Lime, MS [place] Office [phone]

## 2020-06-12 NOTE — Evaluation (Signed)
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 MRI reveals Acute infarct right frontal parietal lobe extending to the occipital lobe. This is a watershed territory infarct without associated hemorrhage.   Clinical Impression   PTA patient independent with ADLs, mobility, per son.  Admitted for above and limited by problem list below, including impaired communication, L sided hemiparesis, impaired balance, impaired cognition.  Patient requires total assist +2 for bed mobility, total assist for all self care at this time.  Patient reaching with R UE, but unable to engage in purposeful or functional tasks using UE without hand over hand support and L UE is flaccid with developing tone in hand/wrist--may benefit from resting hand splint.  Patient able to respond yes correctly to her name, but otherwise yes/no was not very accurate.  Patient will benefit from further OT services while admitted and after discharge at CIR level to optimize independence with ADLs, mobility.     Follow Up Recommendations  CIR;Supervision/Assistance - 24 hour    Equipment Recommendations  Other (comment) (TBD at next venue of care )    Recommendations for Other Services       Precautions / Restrictions Precautions Precautions: Fall Restrictions Weight Bearing Restrictions: No      Mobility Bed Mobility Overal bed mobility: Needs Assistance Bed Mobility: Supine to Sit;Sit to Supine     Supine to sit: Total assist;+2 for physical assistance;+2 for safety/equipment;HOB elevated Sit to supine: Total assist;+2 for physical assistance;+2 for safety/equipment   General bed mobility comments: multimodal cueing required  with poor ability to attend to or follow commands, but patient requires total assist for UB/LB support, scoot hips  Transfers                 General transfer comment: deferred due to safety     Balance Overall balance assessment: Needs assistance Sitting-balance support: Feet supported;Single extremity supported Sitting balance-Leahy Scale: Zero Sitting balance - Comments: static sitting balance only, mod-max assist required  Postural control: Left lateral lean;Posterior lean   Standing balance-Leahy Scale: Zero Standing balance comment: unable                           ADL either performed or assessed with clinical judgement   ADL Overall ADL's : Needs assistance/impaired     Grooming: Total assistance;Bed level Grooming Details (indicate cue type and reason): hand over hand support to initate, able to engage briefly, but unable to sustain task without hand over hand support                             Functional mobility during ADLs: Maximal assistance;Total assistance;+2 for physical assistance;+2 for safety/equipment General ADL Comments: total assist for all self care at this time      Vision   Additional Comments: R gaze preference, able to scan towards L side with cueing limited assessent completed      Perception     Praxis      Pertinent Vitals/Pain Pain Assessment: Faces Faces Pain Scale: Hurts a little bit Pain Location: general discomfort with mobilizing Pain Descriptors / Indicators: Discomfort;Moaning Pain Intervention(s):  Limited activity within patient's tolerance;Monitored during session;Repositioned     Hand Dominance Right   Extremity/Trunk Assessment Upper Extremity Assessment Upper Extremity Assessment: LUE deficits/detail;RUE deficits/detail RUE Deficits / Details: AROM WFL, limited purposeful use  LUE Deficits / Details: grossly 0/5 MMT but also unable to follow commands to test, flaccid with tone in hand  (clonus noted)--may benefit from resting hand splint; decreased sesnsation with no response to noxious stimuli distally   Lower Extremity Assessment Lower Extremity Assessment: Defer to PT evaluation RLE Deficits / Details: no volitional movement on Rt LE to follow simpe commands. Pt with extensor tone/resistant to PROM for knee flexion.  RLE Sensation: decreased proprioception RLE Coordination: decreased gross motor LLE Deficits / Details: 0/5 for knee ext/flex, and ankle dorsi/plantar flexion with. no tone noted in Lt LE with PROM.  LLE Sensation: decreased proprioception LLE Coordination: decreased gross motor   Cervical / Trunk Assessment Cervical / Trunk Assessment: Other exceptions Cervical / Trunk Exceptions: flexed/slouched posture and significant Lt lateral lean secondary to Lt inattention   Communication Communication Communication: Expressive difficulties;Receptive difficulties   Cognition Arousal/Alertness: Awake/alert Behavior During Therapy: Flat affect Overall Cognitive Status: Impaired/Different from baseline Area of Impairment: Orientation;Attention;Safety/judgement;Awareness;Problem solving;Memory;Following commands                 Orientation Level: Disoriented to;Place;Time;Situation;Person Current Attention Level: Focused Memory: Decreased short-term memory Following Commands:  (unable to follow commands ) Safety/Judgement: Decreased awareness of deficits;Decreased awareness of safety Awareness: Intellectual Problem Solving: Slow processing;Requires verbal cues;Requires tactile cues;Difficulty sequencing;Decreased initiation General Comments: patient able to respond yes/no, but not 100% accurate. 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 She does not drive and her daughter takes her grocery shopping weekly.       Prior Functioning/Environment Level of Independence: Independent        Comments: pt's son reprots she was independent with ADL's and mobility in home but is unsure how she was completing all tasks. She had a fall last week in the yard. She has had help/aids brought in previously but has not kept them around to help.         OT Problem List: Decreased strength;Decreased range of motion;Decreased activity tolerance;Impaired balance (sitting and/or standing);Impaired vision/perception;Decreased coordination;Decreased cognition;Decreased safety awareness;Decreased knowledge of use of DME or AE;Decreased knowledge of precautions;Obesity;Impaired UE functional use;Cardiopulmonary status limiting activity;Impaired tone;Impaired sensation      OT Treatment/Interventions: Self-care/ADL training;DME and/or AE instruction;Therapeutic activities;Cognitive remediation/compensation;Patient/family education;Balance training;Visual/perceptual remediation/compensation;Splinting;Manual therapy;Neuromuscular education;Therapeutic exercise    OT Goals(Current goals can be found in the care plan section) Acute Rehab OT Goals Patient Stated Goal: none stated OT Goal Formulation: Patient unable to participate in goal setting Time For Goal Achievement: 06/26/20 Potential to Achieve Goals: Fair  OT Frequency: Min 2X/week  Barriers to D/C:            Co-evaluation PT/OT/SLP  Co-Evaluation/Treatment: Yes Reason for Co-Treatment: Complexity of the patient's impairments (multi-system involvement);For patient/therapist safety;To address functional/ADL transfers;Necessary to address cognition/behavior during functional activity PT goals addressed during session: Mobility/safety with mobility;Balance OT goals addressed during session: ADL's and self-care      AM-PAC OT "6 Clicks" Daily Activity     Outcome Measure Help from another person eating meals?: Total Help from another person taking care of personal grooming?: Total Help from another person toileting, which includes using toliet, bedpan, or urinal?: Total Help from another person bathing (including washing, rinsing, drying)?: Total Help from another person to put on and taking off regular upper body clothing?: Total Help from another person to put on and taking off regular lower body clothing?: Total 6 Click Score: 6   End of Session Nurse Communication: Mobility status  Activity Tolerance: Patient tolerated treatment well Patient left: in bed;with call bell/phone within reach;with bed alarm set;with family/visitor present  OT Visit Diagnosis: Other abnormalities of gait and mobility (R26.89);Cognitive communication deficit (R41.841);Hemiplegia and hemiparesis Symptoms and signs involving cognitive functions: Cerebral infarction Hemiplegia - Right/Left: Left Hemiplegia - caused by: Cerebral infarction                Time: 6812-7517 OT Time Calculation (min): 28 min Charges:  OT General Charges $OT Visit: 1 Visit OT Evaluation $OT Eval Moderate Complexity: 1 Mod  Jolaine Artist, OT Acute Rehabilitation Services Pager [phone] Office [phone]   Delight Stare 06/12/2020, 1:03 PM

## 2020-06-12 NOTE — Progress Notes (Signed)
  Echocardiogram 2D Echocardiogram has been performed.  Cheryl Summers 06/12/2020, 9:05 AM

## 2020-06-13 DIAGNOSIS — I480 Paroxysmal atrial fibrillation: Secondary | ICD-10-CM

## 2020-06-13 DIAGNOSIS — D329 Benign neoplasm of meninges, unspecified: Secondary | ICD-10-CM

## 2020-06-13 DIAGNOSIS — Z7189 Other specified counseling: Secondary | ICD-10-CM

## 2020-06-13 DIAGNOSIS — Z66 Do not resuscitate: Secondary | ICD-10-CM

## 2020-06-13 DIAGNOSIS — I10 Essential (primary) hypertension: Secondary | ICD-10-CM

## 2020-06-13 DIAGNOSIS — E1159 Type 2 diabetes mellitus with other circulatory complications: Secondary | ICD-10-CM

## 2020-06-13 DIAGNOSIS — Z515 Encounter for palliative care: Secondary | ICD-10-CM

## 2020-06-13 DIAGNOSIS — E1165 Type 2 diabetes mellitus with hyperglycemia: Secondary | ICD-10-CM

## 2020-06-13 DIAGNOSIS — R062 Wheezing: Secondary | ICD-10-CM

## 2020-06-13 LAB — RENAL FUNCTION PANEL
Albumin: 3.8 g/dL (ref 3.5–5.0)
Anion gap: 12 (ref 5–15)
BUN: 34 mg/dL — ABNORMAL HIGH (ref 8–23)
CO2: 22 mmol/L (ref 22–32)
Calcium: 9.2 mg/dL (ref 8.9–10.3)
Chloride: 110 mmol/L (ref 98–111)
Creatinine, Ser: 1.64 mg/dL — ABNORMAL HIGH (ref 0.44–1.00)
GFR calc Af Amer: 34 mL/min — ABNORMAL LOW (ref >60)
GFR calc non Af Amer: 29 mL/min — ABNORMAL LOW (ref >60)
Glucose, Bld: 130 mg/dL — ABNORMAL HIGH (ref 70–99)
Phosphorus: 3.1 mg/dL (ref 2.5–4.6)
Potassium: 3.4 mmol/L — ABNORMAL LOW (ref 3.5–5.1)
Sodium: 144 mmol/L (ref 135–145)

## 2020-06-13 LAB — VITAMIN D 25 HYDROXY (VIT D DEFICIENCY, FRACTURES): Vit D, 25-Hydroxy: 74.92 ng/mL (ref 30–100)

## 2020-06-13 MED ORDER — HALOPERIDOL LACTATE 5 MG/ML IJ SOLN: 0.5000 mg | INTRAMUSCULAR | 3 refills | Status: AC | PRN

## 2020-06-13 MED ORDER — ONDANSETRON 4 MG PO TBDP: 4.0000 mg | ORAL_TABLET | Freq: Four times a day (QID) | ORAL | 0 refills | Status: AC | PRN

## 2020-06-13 MED ORDER — METOPROLOL TARTRATE 12.5 MG HALF TABLET: 12.5000 mg | ORAL_TABLET | Freq: Two times a day (BID) | ORAL | Status: DC

## 2020-06-13 MED ORDER — IPRATROPIUM-ALBUTEROL 0.5-2.5 (3) MG/3ML IN SOLN: 3.0000 mL | RESPIRATORY_TRACT | 0 refills | Status: AC

## 2020-06-13 MED ORDER — MORPHINE SULFATE 10 MG/5ML PO SOLN: 2.5000 mg | ORAL | Status: DC | PRN

## 2020-06-13 MED ORDER — ATROPINE SULFATE 1 % OP SOLN: 2.0000 [drp] | Freq: Three times a day (TID) | OPHTHALMIC | 12 refills | Status: AC

## 2020-06-13 MED ORDER — LORAZEPAM 1 MG PO TABS: 1.0000 mg | ORAL_TABLET | ORAL | Status: DC | PRN

## 2020-06-13 MED ORDER — HALOPERIDOL LACTATE 2 MG/ML PO CONC: 0.5000 mg | ORAL | 0 refills | Status: AC | PRN

## 2020-06-13 MED ORDER — HALOPERIDOL 0.5 MG PO TABS: 0.5000 mg | ORAL_TABLET | ORAL | 0 refills | Status: AC | PRN

## 2020-06-13 MED ORDER — HALOPERIDOL LACTATE 2 MG/ML PO CONC
0.5000 mg | ORAL | Status: DC | PRN
  Filled 2020-06-13: qty 0.3

## 2020-06-13 MED ORDER — ACETAMINOPHEN 160 MG/5ML PO SOLN: 650.0000 mg | ORAL | 0 refills | Status: AC | PRN

## 2020-06-13 MED ORDER — ONDANSETRON 4 MG PO TBDP: 4.0000 mg | ORAL_TABLET | Freq: Four times a day (QID) | ORAL | Status: DC | PRN

## 2020-06-13 MED ORDER — BIOTENE DRY MOUTH MT LIQD: 15.0000 mL | OROMUCOSAL | Status: DC | PRN

## 2020-06-13 MED ORDER — LORAZEPAM 2 MG/ML IJ SOLN: 1.0000 mg | INTRAMUSCULAR | Status: DC | PRN

## 2020-06-13 MED ORDER — LORAZEPAM 2 MG/ML PO CONC: 1.0000 mg | ORAL | 0 refills | Status: AC | PRN

## 2020-06-13 MED ORDER — ACETAMINOPHEN 325 MG PO TABS: 650.0000 mg | ORAL_TABLET | ORAL | 0 refills | Status: AC | PRN

## 2020-06-13 MED ORDER — ONDANSETRON HCL 4 MG/2ML IJ SOLN: 4.0000 mg | Freq: Four times a day (QID) | INTRAMUSCULAR | Status: DC | PRN

## 2020-06-13 MED ORDER — ATROPINE SULFATE 1 % OP SOLN
2.0000 [drp] | Freq: Three times a day (TID) | OPHTHALMIC | Status: DC
  Administered 2020-06-13: 2 [drp] via SUBLINGUAL
  Filled 2020-06-13: qty 2

## 2020-06-13 MED ORDER — IPRATROPIUM-ALBUTEROL 0.5-2.5 (3) MG/3ML IN SOLN
3.0000 mL | RESPIRATORY_TRACT | Status: DC
  Administered 2020-06-13: 3 mL via RESPIRATORY_TRACT
  Filled 2020-06-13: qty 3

## 2020-06-13 MED ORDER — LORAZEPAM 2 MG/ML IJ SOLN: 1.0000 mg | INTRAMUSCULAR | 0 refills | Status: AC | PRN

## 2020-06-13 MED ORDER — ONDANSETRON HCL 4 MG/2ML IJ SOLN: 4.0000 mg | Freq: Four times a day (QID) | INTRAMUSCULAR | 0 refills | Status: AC | PRN

## 2020-06-13 MED ORDER — POLYVINYL ALCOHOL 1.4 % OP SOLN: 1.0000 [drp] | Freq: Every day | OPHTHALMIC | 0 refills | Status: AC | PRN

## 2020-06-13 MED ORDER — LOSARTAN POTASSIUM 25 MG PO TABS: 12.5000 mg | ORAL_TABLET | Freq: Every day | ORAL | Status: DC

## 2020-06-13 MED ORDER — BISACODYL 10 MG RE SUPP: 10.0000 mg | Freq: Every day | RECTAL | Status: DC | PRN

## 2020-06-13 MED ORDER — POTASSIUM CHLORIDE CRYS ER 20 MEQ PO TBCR
40.0000 meq | EXTENDED_RELEASE_TABLET | Freq: Once | ORAL | Status: DC
  Filled 2020-06-13: qty 2

## 2020-06-13 MED ORDER — BIOTENE DRY MOUTH MT LIQD: 15.0000 mL | OROMUCOSAL | 0 refills | Status: AC | PRN

## 2020-06-13 MED ORDER — HALOPERIDOL LACTATE 5 MG/ML IJ SOLN: 0.5000 mg | INTRAMUSCULAR | Status: DC | PRN

## 2020-06-13 MED ORDER — MORPHINE SULFATE (PF) 2 MG/ML IV SOLN
1.0000 mg | INTRAVENOUS | 0 refills | Status: AC | PRN
Start: 2020-06-13 — End: ?

## 2020-06-13 MED ORDER — MORPHINE SULFATE (PF) 2 MG/ML IV SOLN
1.0000 mg | INTRAVENOUS | Status: DC | PRN
  Administered 2020-06-13: 1 mg via INTRAVENOUS
  Filled 2020-06-13: qty 1

## 2020-06-13 MED ORDER — IPRATROPIUM-ALBUTEROL 0.5-2.5 (3) MG/3ML IN SOLN: 3.0000 mL | Freq: Four times a day (QID) | RESPIRATORY_TRACT | Status: DC | PRN

## 2020-06-13 MED ORDER — BISACODYL 10 MG RE SUPP: 10.0000 mg | Freq: Every day | RECTAL | 0 refills | Status: AC | PRN

## 2020-06-13 MED ORDER — ASPIRIN 300 MG RE SUPP
150.0000 mg | Freq: Every day | RECTAL | Status: DC
  Administered 2020-06-13: 150 mg via RECTAL

## 2020-06-13 MED ORDER — CLONIDINE HCL 0.1 MG PO TABS
0.1000 mg | ORAL_TABLET | Freq: Two times a day (BID) | ORAL | Status: DC
  Filled 2020-06-13: qty 1

## 2020-06-13 MED ORDER — LORAZEPAM 2 MG/ML PO CONC
1.0000 mg | ORAL | Status: DC | PRN
  Filled 2020-06-13: qty 0.5

## 2020-06-13 MED ORDER — ACETAMINOPHEN 650 MG RE SUPP: 650.0000 mg | RECTAL | 0 refills | Status: AC | PRN

## 2020-06-13 MED ORDER — METOPROLOL TARTRATE 25 MG/10 ML ORAL SUSPENSION
12.5000 mg | Freq: Two times a day (BID) | ORAL | Status: DC
  Filled 2020-06-13: qty 5

## 2020-06-13 MED ORDER — LORAZEPAM 1 MG PO TABS: 1.0000 mg | ORAL_TABLET | ORAL | 0 refills | Status: AC | PRN

## 2020-06-13 MED ORDER — MORPHINE SULFATE 10 MG/5ML PO SOLN
2.5000 mg | ORAL | 0 refills | Status: AC | PRN
Start: 2020-06-13 — End: ?

## 2020-06-13 MED ORDER — HALOPERIDOL 0.5 MG PO TABS: 0.5000 mg | ORAL_TABLET | ORAL | Status: DC | PRN

## 2020-06-13 NOTE — Plan of Care (Signed)
  Problem: Coping: Goal: Will verbalize positive feelings about self Outcome: Progressing Goal: Will identify appropriate support needs Outcome: Progressing   Problem: Ischemic Stroke/TIA Tissue Perfusion: Goal: Complications of ischemic stroke/TIA will be minimized Outcome: Progressing   Problem: Education: Goal: Knowledge of General Education information will improve Description: Including pain rating scale, medication(s)/side effects and non-pharmacologic comfort measures Outcome: Progressing   Problem: Coping: Goal: Level of anxiety will decrease Outcome: Progressing

## 2020-06-13 NOTE — Consult Note (Addendum)
Physical Medicine and Rehabilitation Consult Reason for Consult: Left side weakness Referring Physician: Family medicine   HPI: Cheryl Summers is a 79 y.o. right-handed female with history of CAD/CABG maintained on aspirin, diabetes mellitus, hypertension, hyperlipidemia, paroxysmal atrial fibrillation, COPD/tobacco abuse, mild dementia maintained on Aricept as well as Namenda.  History taken from chart review and daughter due to cognition. Patient lives alone.  Patient bouts of denied that is a furniture walker at home and does not use an assistive device.  Her daughter takes her grocery shopping weekly.  1 level home with ramped entrance.  Independent with ADLs and mobility but has had recent falls.  She presented on 06/11/2020 with left hemiparesis and SOB.  Cranial CT/CTA scan unremarkable for acute intracranial process.  Suspected small acute infarct involving the right precentral gyrus.  No large vessel occlusion.  Plaque along common and internal carotid arteries bilaterally.  Approximate 60% right ICA origin stenosis and approximately 50% stenosis at the left ICA origin.  Patient did not receive TPA.  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  18 mm calcified meningioma left posterior parietal lobe.  No brain edema.  Admission chemistries BUN 28, creatinine 1.89, SARS coronavirus negative, BNP 2163, troponin 9833-8250.  Echocardiogram with ejection fraction of 30-35%.  Mild left ventricular hypertrophy.  Left ventricle has moderate to severely decreased function.  Cardiology service follow-up for elevated troponin as well as new onset systolic congestive heart failure.  She was placed on intravenous Lasix.  EKG showed normal sinus rhythm and work-up currently ongoing.  Aspirin and Plavix initiated for CVA prophylaxis.  Hospital course further complicated by dysphagia, started on dysphagia #1 thin liquid diet.  Palliative care consulted to establish goals of  care and daughter.  Therapy evaluation completed with recommendations of physical medicine rehab consult.  Review of Systems  Unable to perform ROS: Mental acuity   Past Medical History:  Diagnosis Date  . Anxiety   . Carotid artery stenosis, asymptomatic    50% ASYMPTOMATIC  RIGHT CAROTID STENOSIS  . Chronic diastolic heart failure ([place]) 06/09/2015  . Chronic heart failure with preserved ejection fraction (HFpEF) ([place]) 06/09/2015  . COPD (chronic obstructive pulmonary disease) ([place])   . Depression   . Diabetes mellitus without complication ([place])   . Dysrhythmia   . Gastroesophageal reflux   . Hyperlipidemia   . Hypertension   . Hypertensive heart disease with heart failure ([place]) 06/09/2015  . PAF (paroxysmal atrial fibrillation) ([place]) 06/09/2015   Overview:  CHADS2 vasc score=6  . Renal insufficiency   . Tobacco abuse   . Tremors of nervous system    Past Surgical History:  Procedure Laterality Date  . ABDOMINAL HYSTERECTOMY    . CATARACT EXTRACTION  2016  . CORONARY ARTERY BYPASS GRAFT  2012  . NOSE SURGERY     Family History  Problem Relation Age of Onset  . Heart attack Mother   . Hypertension Mother   . Hyperlipidemia Mother   . Heart attack Father   . Hypertension Father   . Hyperlipidemia Father   . Heart attack Sister   . Stroke Brother   . Heart attack Brother   . Hypertension Daughter    Social History:  reports that she has quit smoking. She has never used smokeless tobacco. She reports that she does not drink alcohol and does not use drugs. Allergies: No Known Allergies Facility-Administered Medications Prior to Admission  Medication Dose Route Frequency  Provider Last Rate Last Admin  . betamethasone acetate-betamethasone sodium phosphate (CELESTONE) injection 12 mg  12 mg Other Once Magnus Sinning, MD       Medications Prior to Admission  Medication Sig Dispense Refill  . acetaminophen (TYLENOL) 500 MG tablet Take 500-1,000 mg by mouth every 6 (six)  hours as needed for mild pain or headache.    . albuterol (PROVENTIL HFA;VENTOLIN HFA) 108 (90 Base) MCG/ACT inhaler Inhale 2 puffs into the lungs every 6 (six) hours as needed for wheezing or shortness of breath.    Marland Kitchen albuterol (PROVENTIL) (2.5 MG/3ML) 0.083% nebulizer solution Take 2.5 mg by nebulization daily as needed for wheezing or shortness of breath.    . allopurinol (ZYLOPRIM) 100 MG tablet Take 100 mg by mouth daily as needed (gout).     Marland Kitchen amLODipine (NORVASC) 5 MG tablet Take 5 mg by mouth daily.    . Artificial Tear Solution (SOOTHE XP) SOLN Place 1 drop into both eyes daily as needed (dry eyes).    Marland Kitchen aspirin EC 81 MG tablet Take 81 mg by mouth daily.    Marland Kitchen atorvastatin (LIPITOR) 20 MG tablet Take 20 mg by mouth at bedtime.     . budesonide-formoterol (SYMBICORT) 160-4.5 MCG/ACT inhaler Inhale 2 puffs into the lungs 2 (two) times daily as needed (shortness of breath).     Marland Kitchen buPROPion (WELLBUTRIN XL) 150 MG 24 hr tablet Take 150 mg by mouth at bedtime.     . cloNIDine (CATAPRES) 0.2 MG tablet Take 0.2 mg by mouth in the morning and at bedtime.    . donepezil (ARICEPT) 10 MG tablet Take 10 mg by mouth daily.     Marland Kitchen ezetimibe (ZETIA) 10 MG tablet Take 5 mg by mouth at bedtime.     Marland Kitchen FARXIGA 10 MG TABS tablet Take 5 mg by mouth daily.    . fluticasone (FLONASE) 50 MCG/ACT nasal spray Place 1-2 sprays into both nostrils as needed for allergies.     . memantine (NAMENDA XR) 28 MG CP24 24 hr capsule Take 1 capsule by mouth daily.    . montelukast (SINGULAIR) 10 MG tablet Take 10 mg by mouth at bedtime.     . Multiple Vitamin (MULTIVITAMIN) tablet Take 1 tablet by mouth daily.    Marland Kitchen nystatin-triamcinolone (MYCOLOG II) cream Apply 1 application topically daily as needed (skin bumps).    Marland Kitchen omeprazole (PRILOSEC) 20 MG capsule Take 20 mg by mouth daily.     . polyethylene glycol (MIRALAX) packet Take 17 g by mouth daily. (Patient taking differently: Take 17 g by mouth daily as needed for mild  constipation. ) 14 each 0  . pregabalin (LYRICA) 75 MG capsule Take 75 mg by mouth 2 (two) times daily.    . sertraline (ZOLOFT) 50 MG tablet Take 50 mg by mouth at bedtime.     . sodium chloride (OCEAN) 0.65 % SOLN nasal spray Place 1 spray into both nostrils as needed for congestion.    Marland Kitchen telmisartan (MICARDIS) 80 MG tablet Take 80 mg by mouth daily.    Marland Kitchen tiZANidine (ZANAFLEX) 2 MG tablet Take 2 mg by mouth at bedtime.     . torsemide (DEMADEX) 20 MG tablet Take 20 mg by mouth every Monday, Wednesday, and Friday.       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 She does not drive and her daughter takes her grocery shopping weekly.   Lives With: Other (Comment), Alone (per chart, pt lives alone)  Functional History: Prior Function Level of Independence: Independent Comments: pt's son reprots she was independent with ADL's and mobility in home but is unsure how she was completing all tasks. She had a fall last week in the yard. She has had help/aids brought in previously but has not kept them around to help.  Functional Status:  Mobility: Bed Mobility Overal bed mobility: Needs Assistance Bed Mobility: Supine to Sit, Sit to Supine Supine to sit: Total assist, +2 for physical assistance, +2 for safety/equipment, HOB elevated Sit to supine: Total assist, +2 for physical assistance, +2 for safety/equipment General bed mobility comments: multimodal cueing required with poor ability to attend to or follow commands, but patient requires  total assist for UB/LB support, scoot hips Transfers General transfer comment: deferred due to safety       ADL: ADL Overall ADL's : Needs assistance/impaired Grooming: Total assistance, Bed level Grooming Details (indicate cue type and reason): hand over hand support to initate, able to engage briefly, but unable to sustain task without hand over hand support Functional mobility during ADLs: Maximal assistance, Total assistance, +2 for physical assistance, +2 for safety/equipment General ADL Comments: total assist for all self care at this time   Cognition: Cognition Overall Cognitive Status: Impaired/Different from baseline Arousal/Alertness: Awake/alert Orientation Level: Oriented to person, Disoriented to place, Disoriented to time, Disoriented to situation Attention: Sustained Sustained Attention: Impaired Memory: Impaired Memory Impairment: Storage deficit Awareness: Impaired Problem Solving: Impaired Safety/Judgment: Impaired Comments: pt does not recall to use the call bell within 20 seconds of demonstrating to her- uncertain to baseline Cognition Arousal/Alertness: Awake/alert Behavior During Therapy: Flat affect Overall Cognitive Status: Impaired/Different from baseline Area of Impairment: Orientation, Attention, Safety/judgement, Awareness, Problem solving, Memory, Following commands Orientation Level: Disoriented to, Place, Time, Situation, Person Current Attention Level: Focused Memory: Decreased short-term memory Following Commands:  (unable to follow commands ) Safety/Judgement: Decreased awareness of deficits, Decreased awareness of safety Awareness: Intellectual Problem Solving: Slow processing, Requires verbal cues, Requires tactile cues, Difficulty sequencing, Decreased initiation General Comments: patient able to respond yes/no, but not 100% accurate. poor ability to follow any simple commands   Blood pressure 130/77, pulse 96, temperature 98.1 F (36.7  C), temperature source Oral, resp. rate 18, height 5\' 2"  (1.575 m), weight 86.6 kg, SpO2 98 %. Physical Exam Vitals reviewed.  Constitutional:      General: She is not in acute distress.    Appearance: She is obese.  HENT:     Head: Normocephalic and atraumatic.     Right Ear: External ear normal.     Left Ear: External ear normal.     Nose: Nose normal.  Eyes:     General:        Right eye: No discharge.        Left eye: No discharge.     Extraocular Movements: Extraocular movements intact.  Cardiovascular:     Rate and Rhythm: Normal rate and regular rhythm.  Pulmonary:     Effort: Pulmonary effort is normal. No respiratory distress.     Comments: +Expiratory wheezes Abdominal:     General:  Abdomen is flat. Bowel sounds are normal. There is no distension.  Musculoskeletal:     Cervical back: Normal range of motion and neck supple.     Comments: No edema or tenderness in extremities  Neurological:     Mental Status: She is alert.     Comments: Alert Global aphasia Motor: Limited ability to follow commands, no spontaneous movements noted  Psychiatric:     Comments: Unable to assess due to mentation     Results for orders placed or performed during the hospital encounter of 06/11/20 (from the past 24 hour(s))  Basic metabolic panel     Status: Abnormal   Collection Time: 06/12/20 10:41 AM  Result Value Ref Range   Sodium 144 135 - 145 mmol/L   Potassium 4.1 3.5 - 5.1 mmol/L   Chloride 113 (H) 98 - 111 mmol/L   CO2 18 (L) 22 - 32 mmol/L   Glucose, Bld 191 (H) 70 - 99 mg/dL   BUN 26 (H) 8 - 23 mg/dL   Creatinine, Ser 1.53 (H) 0.44 - 1.00 mg/dL   Calcium 9.4 8.9 - 10.3 mg/dL   GFR calc non Af Amer 32 (L) >60 mL/min   GFR calc Af Amer 37 (L) >60 mL/min   Anion gap 13 5 - 15  Troponin I (High Sensitivity)     Status: Abnormal   Collection Time: 06/12/20  6:54 PM  Result Value Ref Range   Troponin I (High Sensitivity) 2,943 (HH) <18 ng/L  Troponin I (High Sensitivity)      Status: Abnormal   Collection Time: 06/12/20  9:20 PM  Result Value Ref Range   Troponin I (High Sensitivity) 3,488 (HH) <18 ng/L  Renal function panel     Status: Abnormal   Collection Time: 06/13/20  2:05 AM  Result Value Ref Range   Sodium 144 135 - 145 mmol/L   Potassium 3.4 (L) 3.5 - 5.1 mmol/L   Chloride 110 98 - 111 mmol/L   CO2 22 22 - 32 mmol/L   Glucose, Bld 130 (H) 70 - 99 mg/dL   BUN 34 (H) 8 - 23 mg/dL   Creatinine, Ser 1.64 (H) 0.44 - 1.00 mg/dL   Calcium 9.2 8.9 - 10.3 mg/dL   Phosphorus 3.1 2.5 - 4.6 mg/dL   Albumin 3.8 3.5 - 5.0 g/dL   GFR calc non Af Amer 29 (L) >60 mL/min   GFR calc Af Amer 34 (L) >60 mL/min   Anion gap 12 5 - 15   MR BRAIN WO CONTRAST  Result Date: 06/11/2020 CLINICAL DATA:  Stroke.  Left-sided weakness. EXAM: MRI HEAD WITHOUT CONTRAST TECHNIQUE: Multiplanar, multiecho pulse sequences of the brain and surrounding structures were obtained without intravenous contrast. COMPARISON:  CT angio head and neck 8 18 2021.  MRI head 06/05/2020 FINDINGS: Brain: Acute infarct in the right frontal parietal lobe extending to the occipital lobe. This is a watershed territory infarct. This was not present on the recent MRI of 06/05/2020. Calcific extra-axial mass in the left posterior parietal lobe measuring 18 mm compatible with meningioma. No brain edema. Generalized atrophy with mild chronic microvascular ischemic change in the white matter and pons. Chronic microhemorrhage in the left middle frontal lobe. Vascular: Normal arterial flow voids with stenosis in the distal left vertebral artery. Skull and upper cervical spine: No focal skeletal lesion. Sinuses/Orbits: Mild mucosal edema paranasal sinuses. Bilateral cataract extraction Other: None IMPRESSION: Acute infarct right frontal parietal lobe extending to the occipital lobe. This is a  watershed territory infarct without associated hemorrhage. Atrophy and mild chronic microvascular ischemic change 18 mm calcified  meningioma left posterior parietal lobe. No brain edema. Electronically Signed   By: Franchot Gallo M.D.   On: 06/11/2020 18:02   CT CEREBRAL PERFUSION W CONTRAST  Result Date: 06/11/2020 CLINICAL DATA:  Left-sided weakness EXAM: CT HEAD CODE STROKE CT ANGIOGRAPHY HEAD AND NECK CT PERFUSION BRAIN TECHNIQUE: Multidetector CT imaging of the head and neck was performed using the standard protocol during bolus administration of intravenous contrast. Multiplanar CT image reconstructions and MIPs were obtained to evaluate the vascular anatomy. Carotid stenosis measurements (when applicable) are obtained utilizing NASCET criteria, using the distal internal carotid diameter as the denominator. Multiphase CT imaging of the brain was performed following IV bolus contrast injection. Subsequent parametric perfusion maps were calculated using RAPID software. CONTRAST:  100 mL Omnipaque 350 COMPARISON:  CT head 09/24/2019, MRI head 06/05/2020 FINDINGS: CT HEAD FINDINGS Brain: There is no acute intracranial hemorrhage or mass effect. There is a small area of hypoattenuation with loss of gray-white differentiation in the right frontal lobe involving the precentral gyrus. Additional patchy hypoattenuation in the supratentorial white matter is nonspecific but may reflect mild to moderate chronic microvascular ischemic changes. Prominence of the ventricles and sulci reflects generalized parenchymal volume loss. 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 Vascular: No hyperdense vessel. Skull: Unremarkable. Sinuses/Orbits: Retained secretions within the right sphenoid. Patchy ethmoid mucosal thickening. No acute orbital finding. Other: Mastoid air cells are clear. ASPECTS [facility] Stroke Program Early CT Score) - Ganglionic level infarction (caudate, lentiform nuclei, internal capsule, insula, M1-M3 cortex): 7 - Supraganglionic infarction (M4-M6 cortex): 2 Total score (0-10  with 10 being normal): 9 Review of the MIP images confirms the above findings CTA NECK FINDINGS Aortic arch: Calcified and noncalcified plaque along the arch and great vessel origins. 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 Right carotid system: Patent. Multifocal calcified and noncalcified plaque along the common carotid with less than 50% stenosis. Mixed plaque is present at the ICA origin causing approximately 60% stenosis. Left carotid system: Patent. Calcified and noncalcified plaque along the common carotid causing less than 50% stenosis. Mixed plaque at the ICA origin causing approximately 50% stenosis. There is subsequent additional mixed plaque causing at least 80% stenosis. Vertebral arteries: Patent. Right vertebral artery is dominant. Mixed plaque along the left V2 segment causing moderate stenosis. Skeleton: Degenerative changes of the cervical spine. Other neck: Heterogeneous, enlarged left thyroid, which has been previously evaluated by ultrasound. Upper chest: Small bilateral pleural effusions. Interlobular septal thickening and patchy ground-glass density, which may reflect edema. Review of the MIP images confirms the above findings CTA HEAD FINDINGS Anterior circulation: Intracranial internal carotid arteries are patent with calcified plaque causing moderate stenosis. Anterior and middle cerebral arteries are patent. Posterior circulation: Intracranial vertebral arteries are patent. There is mixed plaque causing mild stenosis on the right and marked stenosis on the left. Patent PICA origins. Basilar artery is patent. Posterior cerebral arteries are patent. Venous sinuses: As permitted by contrast timing, patent. Review of the MIP images confirms the above findings CT Brain Perfusion Findings: CBF (<30%) Volume: 23mL Perfusion (Tmax>6.0s) volume: 31mL Mismatch Volume: 25mL Infarction Location: None IMPRESSION: No acute intracranial hemorrhage. Suspected small acute infarction  involving the right precentral gyrus (ASPECT score 9). No large vessel occlusion. No evidence of core infarction or territory at risk by perfusion imaging. Plaque along common and internal carotid  arteries bilaterally. Approximately 60% stenosis at the right ICA origin. Approximately 50% stenosis at the left ICA origin with subsequent plaque causing 80% stenosis. Moderate stenosis of the intracranial internal carotid arteries. 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 Partially imaged small bilateral pleural effusions and suspected mild pulmonary edema. Initial results were communicated to Dr. Leonel Ramsay at 12:35 pmon 8/18/2021by text page via the [facility] messaging system. Electronically Signed   By: Macy Mis M.D.   On: 06/11/2020 13:02   DG Chest Port 1 View  Result Date: 06/11/2020 CLINICAL DATA:  Onset shortness of breath and weakness is morning. EXAM: PORTABLE CHEST 1 VIEW COMPARISON:  PA and lateral chest 09/17/2019. FINDINGS: Extensive bilateral airspace disease is somewhat worse on the right. There are small bilateral pleural effusions. Heart size is upper normal. The patient is status post CABG. IMPRESSION: Right greater than left airspace disease and small effusions have an appearance most suggestive of congestive failure rather than pneumonia. Electronically Signed   By: Inge Rise M.D.   On: 06/11/2020 13:52   ECHOCARDIOGRAM COMPLETE  Result Date: 06/12/2020    ECHOCARDIOGRAM REPORT   Patient Name:   TARISA PAOLA Date of Exam: 06/12/2020 Medical Rec #:  [id number]      Height:       62.0 in Accession #:    [id number]     Weight:       190.9 lb Date of Birth:  [birth year]      BSA:          1.874 m Patient Age:    53 years       BP:           147/76 mmHg Patient Gender: F              HR:           110 bpm. Exam Location:  Inpatient Procedure: 2D Echo, 3D Echo, Cardiac Doppler and Color Doppler REPORT CONTAINS CRITICAL RESULT Indications:    Stroke  History:         Patient has prior history of Echocardiogram examinations, most                 recent 09/24/2019. Abnormal ECG, Stroke and COPD,                 Arrythmias:Atrial Fibrillation, Signs/Symptoms:Murmur, Altered                 Mental Status, Dyspnea and Shortness of Breath; Risk                 Factors:Hypertension and Current Smoker.  Sonographer:    Roseanna Rainbow RDCS Referring Phys: [id number] DAN FLOYD IMPRESSIONS  1. LVEF is depressed with akinesis of the mid/distal septal, mid/distal inferior, distal anterior, distal lateral and apical walls. COmpared to echo in 2020, these changes are new.. Left ventricular ejection fraction, by estimation, is 30 to 35%. The left ventricle has moderate to severely decreased function. There is mild left ventricular hypertrophy. Left ventricular diastolic parameters are indeterminate.  2. Right ventricular systolic function is normal. The right ventricular size is normal. There is severely elevated pulmonary artery systolic pressure.  3. Left atrial size was mildly dilated.  4. The mitral valve is normal in structure. Mild mitral valve regurgitation.  5. Tricuspid valve regurgitation is mild to moderate.  6. The aortic valve is abnormal. Aortic valve regurgitation is trivial. Mild aortic valve sclerosis is present, with no evidence of aortic valve stenosis.  FINDINGS  Left Ventricle: LVEF is depressed with akinesis of the mid/distal septal, mid/distal inferior, distal anterior, distal lateral and apical walls. COmpared to echo in 2020, these changes are new. Left ventricular ejection fraction, by estimation, is 30 to  35%. The left ventricle has moderate to severely decreased function. The left ventricle demonstrates regional wall motion abnormalities. Definity contrast agent was given IV to delineate the left ventricular endocardial borders. The left ventricular internal cavity size was normal in size. There is mild left ventricular hypertrophy. Left ventricular diastolic parameters are  indeterminate. Right Ventricle: The right ventricular size is normal. No increase in right ventricular wall thickness. Right ventricular systolic function is normal. There is severely elevated pulmonary artery systolic pressure. The tricuspid regurgitant velocity is 3.65 m/s, and with an assumed right atrial pressure of 15 mmHg, the estimated right ventricular systolic pressure is 23.7 mmHg. Left Atrium: Left atrial size was mildly dilated. Right Atrium: Right atrial size was normal in size. Pericardium: Trivial pericardial effusion is present. Mitral Valve: The mitral valve is normal in structure. Mild mitral valve regurgitation. Tricuspid Valve: The tricuspid valve is normal in structure. Tricuspid valve regurgitation is mild to moderate. Aortic Valve: The aortic valve is abnormal. Aortic valve regurgitation is trivial. Mild aortic valve sclerosis is present, with no evidence of aortic valve stenosis. Pulmonic Valve: The pulmonic valve was grossly normal. Pulmonic valve regurgitation is mild to moderate. Aorta: The aortic root is normal in size and structure. IAS/Shunts: The interatrial septum was not assessed.  LEFT VENTRICLE PLAX 2D LVIDd:         4.50 cm      Diastology LVIDs:         2.90 cm      LV e' lateral:   7.40 cm/s LV PW:         1.20 cm      LV E/e' lateral: 15.2 LV IVS:        1.30 cm      LV e' medial:    5.77 cm/s LVOT diam:     1.70 cm      LV E/e' medial:  19.5 LV SV:         40 LV SV Index:   21 LVOT Area:     2.27 cm  LV Volumes (MOD) LV vol d, MOD A2C: 101.0 ml LV vol d, MOD A4C: 82.7 ml LV vol s, MOD A2C: 46.3 ml LV vol s, MOD A4C: 55.7 ml LV SV MOD A2C:     54.7 ml LV SV MOD A4C:     82.7 ml LV SV MOD BP:      42.2 ml RIGHT VENTRICLE             IVC RV S prime:     10.70 cm/s  IVC diam: 1.80 cm TAPSE (M-mode): 1.5 cm LEFT ATRIUM             Index       RIGHT ATRIUM           Index LA diam:        4.40 cm 2.35 cm/m  RA Area:     14.60 cm LA Vol (A2C):   76.4 ml 40.76 ml/m RA Volume:    35.00 ml  18.67 ml/m LA Vol (A4C):   58.1 ml 31.00 ml/m LA Biplane Vol: 70.6 ml 37.67 ml/m  AORTIC VALVE             PULMONIC VALVE LVOT Vmax:  112.00 cm/s PR End Diast Vel: 3.17 msec LVOT Vmean:  73.400 cm/s LVOT VTI:    0.175 m  AORTA Ao Root diam: 3.00 cm Ao Asc diam:  3.15 cm MITRAL VALVE                TRICUSPID VALVE MV Area (PHT): 4.07 cm     TR Peak grad:   53.3 mmHg MV Decel Time: 187 msec     TR Vmax:        365.00 cm/s MR PISA:        1.01 cm MR PISA Radius: 0.40 cm     SHUNTS MV E velocity: 112.50 cm/s  Systemic VTI:  0.18 m MV A velocity: 73.30 cm/s   Systemic Diam: 1.70 cm MV E/A ratio:  1.53 Dorris Carnes MD Electronically signed by Dorris Carnes MD Signature Date/Time: 06/12/2020/12:56:11 PM    Final    CT HEAD CODE STROKE WO CONTRAST  Result Date: 06/11/2020 CLINICAL DATA:  Left-sided weakness EXAM: CT HEAD CODE STROKE CT ANGIOGRAPHY HEAD AND NECK CT PERFUSION BRAIN TECHNIQUE: Multidetector CT imaging of the head and neck was performed using the standard protocol during bolus administration of intravenous contrast. Multiplanar CT image reconstructions and MIPs were obtained to evaluate the vascular anatomy. Carotid stenosis measurements (when applicable) are obtained utilizing NASCET criteria, using the distal internal carotid diameter as the denominator. Multiphase CT imaging of the brain was performed following IV bolus contrast injection. Subsequent parametric perfusion maps were calculated using RAPID software. CONTRAST:  100 mL Omnipaque 350 COMPARISON:  CT head 09/24/2019, MRI head 06/05/2020 FINDINGS: CT HEAD FINDINGS Brain: There is no acute intracranial hemorrhage or mass effect. There is a small area of hypoattenuation with loss of gray-white differentiation in the right frontal lobe involving the precentral gyrus. Additional patchy hypoattenuation in the supratentorial white matter is nonspecific but may reflect mild to moderate chronic microvascular ischemic changes. Prominence of the  ventricles and sulci reflects generalized parenchymal volume loss. 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 Vascular: No hyperdense vessel. Skull: Unremarkable. Sinuses/Orbits: Retained secretions within the right sphenoid. Patchy ethmoid mucosal thickening. No acute orbital finding. Other: Mastoid air cells are clear. ASPECTS [facility] Stroke Program Early CT Score) - Ganglionic level infarction (caudate, lentiform nuclei, internal capsule, insula, M1-M3 cortex): 7 - Supraganglionic infarction (M4-M6 cortex): 2 Total score (0-10 with 10 being normal): 9 Review of the MIP images confirms the above findings CTA NECK FINDINGS Aortic arch: Calcified and noncalcified plaque along the arch and great vessel origins. 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 Right carotid system: Patent. Multifocal calcified and noncalcified plaque along the common carotid with less than 50% stenosis. Mixed plaque is present at the ICA origin causing approximately 60% stenosis. Left carotid system: Patent. Calcified and noncalcified plaque along the common carotid causing less than 50% stenosis. Mixed plaque at the ICA origin causing approximately 50% stenosis. There is subsequent additional mixed plaque causing at least 80% stenosis. Vertebral arteries: Patent. Right vertebral artery is dominant. Mixed plaque along the left V2 segment causing moderate stenosis. Skeleton: Degenerative changes of the cervical spine. Other neck: Heterogeneous, enlarged left thyroid, which has been previously evaluated by ultrasound. Upper chest: Small bilateral pleural effusions. Interlobular septal thickening and patchy ground-glass density, which may reflect edema. Review of the MIP images confirms the above findings CTA HEAD FINDINGS Anterior circulation: Intracranial internal carotid arteries are patent with calcified plaque causing moderate stenosis. Anterior  and middle  cerebral arteries are patent. Posterior circulation: Intracranial vertebral arteries are patent. There is mixed plaque causing mild stenosis on the right and marked stenosis on the left. Patent PICA origins. Basilar artery is patent. Posterior cerebral arteries are patent. Venous sinuses: As permitted by contrast timing, patent. Review of the MIP images confirms the above findings CT Brain Perfusion Findings: CBF (<30%) Volume: 43mL Perfusion (Tmax>6.0s) volume: 39mL Mismatch Volume: 9mL Infarction Location: None IMPRESSION: No acute intracranial hemorrhage. Suspected small acute infarction involving the right precentral gyrus (ASPECT score 9). No large vessel occlusion. No evidence of core infarction or territory at risk by perfusion imaging. Plaque along common and internal carotid arteries bilaterally. Approximately 60% stenosis at the right ICA origin. Approximately 50% stenosis at the left ICA origin with subsequent plaque causing 80% stenosis. Moderate stenosis of the intracranial internal carotid arteries. 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 Partially imaged small bilateral pleural effusions and suspected mild pulmonary edema. Initial results were communicated to Dr. Leonel Ramsay at 12:35 pmon 8/18/2021by text page via the [facility] messaging system. Electronically Signed   By: Macy Mis M.D.   On: 06/11/2020 13:02   CT ANGIO HEAD CODE STROKE  Result Date: 06/11/2020 CLINICAL DATA:  Left-sided weakness EXAM: CT HEAD CODE STROKE CT ANGIOGRAPHY HEAD AND NECK CT PERFUSION BRAIN TECHNIQUE: Multidetector CT imaging of the head and neck was performed using the standard protocol during bolus administration of intravenous contrast. Multiplanar CT image reconstructions and MIPs were obtained to evaluate the vascular anatomy. Carotid stenosis measurements (when applicable) are obtained utilizing NASCET criteria, using the distal internal carotid diameter as the denominator. Multiphase CT  imaging of the brain was performed following IV bolus contrast injection. Subsequent parametric perfusion maps were calculated using RAPID software. CONTRAST:  100 mL Omnipaque 350 COMPARISON:  CT head 09/24/2019, MRI head 06/05/2020 FINDINGS: CT HEAD FINDINGS Brain: There is no acute intracranial hemorrhage or mass effect. There is a small area of hypoattenuation with loss of gray-white differentiation in the right frontal lobe involving the precentral gyrus. Additional patchy hypoattenuation in the supratentorial white matter is nonspecific but may reflect mild to moderate chronic microvascular ischemic changes. Prominence of the ventricles and sulci reflects generalized parenchymal volume loss. 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 Vascular: No hyperdense vessel. Skull: Unremarkable. Sinuses/Orbits: Retained secretions within the right sphenoid. Patchy ethmoid mucosal thickening. No acute orbital finding. Other: Mastoid air cells are clear. ASPECTS [facility] Stroke Program Early CT Score) - Ganglionic level infarction (caudate, lentiform nuclei, internal capsule, insula, M1-M3 cortex): 7 - Supraganglionic infarction (M4-M6 cortex): 2 Total score (0-10 with 10 being normal): 9 Review of the MIP images confirms the above findings CTA NECK FINDINGS Aortic arch: Calcified and noncalcified plaque along the arch and great vessel origins. 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 Right carotid system: Patent. Multifocal calcified and noncalcified plaque along the common carotid with less than 50% stenosis. Mixed plaque is present at the ICA origin causing approximately 60% stenosis. Left carotid system: Patent. Calcified and noncalcified plaque along the common carotid causing less than 50% stenosis. Mixed plaque at the ICA origin causing approximately 50% stenosis. There is subsequent additional mixed plaque causing at least 80% stenosis.  Vertebral arteries: Patent. Right vertebral artery is dominant. Mixed plaque along the left V2 segment causing moderate stenosis. Skeleton: Degenerative changes of the cervical spine. Other neck: Heterogeneous, enlarged left thyroid, which has been previously evaluated by  ultrasound. Upper chest: Small bilateral pleural effusions. Interlobular septal thickening and patchy ground-glass density, which may reflect edema. Review of the MIP images confirms the above findings CTA HEAD FINDINGS Anterior circulation: Intracranial internal carotid arteries are patent with calcified plaque causing moderate stenosis. Anterior and middle cerebral arteries are patent. Posterior circulation: Intracranial vertebral arteries are patent. There is mixed plaque causing mild stenosis on the right and marked stenosis on the left. Patent PICA origins. Basilar artery is patent. Posterior cerebral arteries are patent. Venous sinuses: As permitted by contrast timing, patent. Review of the MIP images confirms the above findings CT Brain Perfusion Findings: CBF (<30%) Volume: 62mL Perfusion (Tmax>6.0s) volume: 51mL Mismatch Volume: 79mL Infarction Location: None IMPRESSION: No acute intracranial hemorrhage. Suspected small acute infarction involving the right precentral gyrus (ASPECT score 9). No large vessel occlusion. No evidence of core infarction or territory at risk by perfusion imaging. Plaque along common and internal carotid arteries bilaterally. Approximately 60% stenosis at the right ICA origin. Approximately 50% stenosis at the left ICA origin with subsequent plaque causing 80% stenosis. Moderate stenosis of the intracranial internal carotid arteries. 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 Partially imaged small bilateral pleural effusions and suspected mild pulmonary edema. Initial results were communicated to Dr. Leonel Ramsay at 12:35 pmon 8/18/2021by text page via the [facility] messaging system. Electronically Signed    By: Macy Mis M.D.   On: 06/11/2020 13:02   CT ANGIO NECK CODE STROKE  Result Date: 06/11/2020 CLINICAL DATA:  Left-sided weakness EXAM: CT HEAD CODE STROKE CT ANGIOGRAPHY HEAD AND NECK CT PERFUSION BRAIN TECHNIQUE: Multidetector CT imaging of the head and neck was performed using the standard protocol during bolus administration of intravenous contrast. Multiplanar CT image reconstructions and MIPs were obtained to evaluate the vascular anatomy. Carotid stenosis measurements (when applicable) are obtained utilizing NASCET criteria, using the distal internal carotid diameter as the denominator. Multiphase CT imaging of the brain was performed following IV bolus contrast injection. Subsequent parametric perfusion maps were calculated using RAPID software. CONTRAST:  100 mL Omnipaque 350 COMPARISON:  CT head 09/24/2019, MRI head 06/05/2020 FINDINGS: CT HEAD FINDINGS Brain: There is no acute intracranial hemorrhage or mass effect. There is a small area of hypoattenuation with loss of gray-white differentiation in the right frontal lobe involving the precentral gyrus. Additional patchy hypoattenuation in the supratentorial white matter is nonspecific but may reflect mild to moderate chronic microvascular ischemic changes. Prominence of the ventricles and sulci reflects generalized parenchymal volume loss. 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 Vascular: No hyperdense vessel. Skull: Unremarkable. Sinuses/Orbits: Retained secretions within the right sphenoid. Patchy ethmoid mucosal thickening. No acute orbital finding. Other: Mastoid air cells are clear. ASPECTS [facility] Stroke Program Early CT Score) - Ganglionic level infarction (caudate, lentiform nuclei, internal capsule, insula, M1-M3 cortex): 7 - Supraganglionic infarction (M4-M6 cortex): 2 Total score (0-10 with 10 being normal): 9 Review of the MIP images confirms the above findings CTA  NECK FINDINGS Aortic arch: Calcified and noncalcified plaque along the arch and great vessel origins. 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 Right carotid system: Patent. Multifocal calcified and noncalcified plaque along the common carotid with less than 50% stenosis. Mixed plaque is present at the ICA origin causing approximately 60% stenosis. Left carotid system: Patent. Calcified and noncalcified plaque along the common carotid causing less than 50% stenosis. Mixed plaque at the ICA origin causing approximately 50% stenosis. There is subsequent additional  mixed plaque causing at least 80% stenosis. Vertebral arteries: Patent. Right vertebral artery is dominant. Mixed plaque along the left V2 segment causing moderate stenosis. Skeleton: Degenerative changes of the cervical spine. Other neck: Heterogeneous, enlarged left thyroid, which has been previously evaluated by ultrasound. Upper chest: Small bilateral pleural effusions. Interlobular septal thickening and patchy ground-glass density, which may reflect edema. Review of the MIP images confirms the above findings CTA HEAD FINDINGS Anterior circulation: Intracranial internal carotid arteries are patent with calcified plaque causing moderate stenosis. Anterior and middle cerebral arteries are patent. Posterior circulation: Intracranial vertebral arteries are patent. There is mixed plaque causing mild stenosis on the right and marked stenosis on the left. Patent PICA origins. Basilar artery is patent. Posterior cerebral arteries are patent. Venous sinuses: As permitted by contrast timing, patent. Review of the MIP images confirms the above findings CT Brain Perfusion Findings: CBF (<30%) Volume: 40mL Perfusion (Tmax>6.0s) volume: 69mL Mismatch Volume: 65mL Infarction Location: None IMPRESSION: No acute intracranial hemorrhage. Suspected small acute infarction involving the right precentral gyrus (ASPECT score 9). No large vessel occlusion. No  evidence of core infarction or territory at risk by perfusion imaging. Plaque along common and internal carotid arteries bilaterally. Approximately 60% stenosis at the right ICA origin. Approximately 50% stenosis at the left ICA origin with subsequent plaque causing 80% stenosis. Moderate stenosis of the intracranial internal carotid arteries. 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 Partially imaged small bilateral pleural effusions and suspected mild pulmonary edema. Initial results were communicated to Dr. Leonel Ramsay at 12:35 pmon 8/18/2021by text page via the [facility] messaging system. Electronically Signed   By: Macy Mis M.D.   On: 06/11/2020 13:02    Assessment/Plan: Diagnosis: Acute infarct right frontal parietal lobe extending to the occipital lobe.   Stroke: Continue secondary stroke prophylaxis and Risk Factor Modification listed below:   Antiplatelet therapy:   Blood Pressure Management:  Continue current medication with prn's with permisive HTN per primary team Statin Agent:   Diabetes management:   ?Left sided hemiparesis: fit for orthosis to prevent contractures (resting hand splint for day, wrist cock up splint at night, PRAFO, etc) Motor recovery: Zoloft Labs independently reviewed.  Records reviewed and summated above.  1. Does the need for close, 24 hr/day medical supervision in concert with the patient's rehab needs make it unreasonable for this patient to be served in a less intensive setting? Yes 2. Co-Morbidities requiring supervision/potential complications: CAD/CABG maintained on aspirin, DM with hyperglycemia (Monitor in accordance with exercise and adjust meds as necessary), HTN (monitor and provide prns in accordance with increased physical exertion and pain), hyperlipidemia, paroxysmal atrial fibrillation, COPD/tobacco abuse, mild dementia, new onset systolic congestive heart failure (Daily weights), hypokalemia (continue to monitor and replete as  necessary) 3. Due to bladder management, bowel management, safety, skin/wound care, disease management, medication administration and patient education, does the patient require 24 hr/day rehab nursing? Yes 4. Does the patient require coordinated care of a physician, rehab nurse, therapy disciplines of PT/OT/SLP to address physical and functional deficits in the context of the above medical diagnosis(es)? Yes Addressing deficits in the following areas: balance, endurance, locomotion, strength, transferring, bowel/bladder control, bathing, dressing, feeding, grooming, toileting, cognition, speech, language, swallowing and psychosocial support 5. Can the patient actively participate in an intensive therapy program of at least 3 hrs of therapy per day at least 5 days per week? Potentially 6. The potential for patient to make measurable gains while on inpatient rehab is excellent 7. Anticipated functional  outcomes upon discharge from inpatient rehab are min assist  with PT, min assist with OT, min assist with SLP. 8. Estimated rehab length of stay to reach the above functional goals is: 27-32 days. 9. Anticipated discharge destination: TBD. 10. Overall Rehab/Functional Prognosis: good and fair  RECOMMENDATIONS: This patient's condition is appropriate for continued rehabilitative care in the following setting: CIR if patient and family in favor, currently discussing with palliative care. Patient has agreed to participate in recommended program. Potentially Note that insurance prior authorization may be required for reimbursement for recommended care.  Comment: Rehab Admissions Coordinator to follow up.  I have personally performed a face to face diagnostic evaluation, including, but not limited to relevant history and physical exam findings, of this patient and developed relevant assessment and plan.  Additionally, I have reviewed and concur with the physician assistant's documentation above.   Delice Lesch, MD, ABPMR Lavon Paganini Angiulli, PA-C 06/13/2020

## 2020-06-13 NOTE — Progress Notes (Signed)
STROKE TEAM PROGRESS NOTE   INTERVAL HISTORY Patient seems to be breathing better today after being diuresed with Lasix and echocardiogram shows diminished ejection fraction 30 to 35% with diffuse hypokinesis.  Neurologically she is unchanged with dense left hemiplegia.  Her daughter is at the bedside.  I had a long discussion with her and she agrees to palliative care and comfort care measures transferred to [facility].  Vitals:   06/12/20 1929 06/12/20 1939 06/12/20 2347 06/13/20 0333  BP:  116/84 122/79 130/77  Pulse:  93 91 96  Resp:  18 18 18   Temp:  99 F (37.2 C) 98.5 F (36.9 C) 98.1 F (36.7 C)  TempSrc:  Oral Oral Oral  SpO2: 95% 97% 98% 98%  Weight:      Height:       CBC:  Recent Labs  Lab 06/11/20 1211  WBC 19.0*  NEUTROABS 16.5*  HGB 13.0  13.3  HCT 42.7  39.0  MCV 96.4  PLT 818   Basic Metabolic Panel:  Recent Labs  Lab 06/12/20 1041 06/13/20 0205  NA 144 144  K 4.1 3.4*  CL 113* 110  CO2 18* 22  GLUCOSE 191* 130*  BUN 26* 34*  CREATININE 1.53* 1.64*  CALCIUM 9.4 9.2  PHOS  --  3.1   Lipid Panel:  Recent Labs  Lab 06/12/20 0222  CHOL 160  TRIG 66  HDL 64  CHOLHDL 2.5  VLDL 13  LDLCALC 83   HgbA1c:  Recent Labs  Lab 06/12/20 0222  HGBA1C 5.8*   Urine Drug Screen: No results for input(s): LABOPIA, COCAINSCRNUR, LABBENZ, AMPHETMU, THCU, LABBARB in the last 168 hours.  Alcohol Level No results for input(s): ETH in the last 168 hours.  IMAGING past 24 hours No results found.  PHYSICAL EXAM   Pleasant elderly Caucasian lady lying comfortably in bed.  Not in distress. . Afebrile. Head is nontraumatic. Neck is supple without bruit.    Cardiac exam no murmur or gallop. Lungs are clear to auscultation. Distal pulses are well felt. Neurological Exam : Awake alert oriented to person only.  Diminished attention, registration and recall.  Mildly dysarthric speech which is nonfluent hesitant and  speaks only short sentences and few words.  Poor insight into her condition.  Extraocular movements are full range without nystagmus.  Blinks to threat bilaterally.  Left lower facial weakness.  Tongue midline.  Dense left hemiplegia with only trace withdrawal in upper and lower extremities with flaccidity and hypotonia.  Normal antigravity movements on the right side.  Left plantar upgoing right downgoing.  Gait not tested  ASSESSMENT/PLAN [name] is a 79 y.o. female with history of mild dementia, tobacco abuse, hyperlipidemia, hypertension, paroxysmal atrial fibrillation, diabetes, carotid artery disease and falls presenting with L sided weakness, dysarthria, naming difficulties alone w/ SOB.  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 ASPECTS 9    CTA head & neck B ICA plaque, R ICA origin 60%, L ICA origin 50%, L ICA 80%. Intracranial ICA moderate stenosis. Intracranial L VA severe stenosis. B pleural effusions  CT perfusion no core  MRI  R frontal parietal lobe to occipital lobe infarct watershed territory. L posterior parietal meningioma.  2D Echo EF 30-35%. No source of embolus    LDL 83  HgbA1c 5.8  VTE prophylaxis - Heparin  5000 units sq tid   aspirin 81 mg daily prior to admission, now on No antithrombotic. Added aspirin and plavix x 3 months then plavix alone given intracranial atherosclerosis.   Therapy recommendations:  SNF  Disposition:  pending   DNR  Dr Leonie Man spoke in depth w/ dtr (POA) related to all diagnoses and prognosis. dtr interested in [facility] in [place]. I consulted TOC  Acute on Chronic diastolic Congestive heart failure   HFpEF 30-35%w/ exacerbation  Diuresed yest  Cardiology consulted.   Meds adjusted.  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   Carotid stenosis  CTA neck B ICA plaque, R ICA origin 60%, L ICA  origin 50%, L ICA 80%.   Doppler 08/2019 R 40-59%, CCA <50%. ECA > 50%. L 40-59% CCA <50%. ECA > 50%  Not a surgical candidate given medical issues.  Atrial Fibrillation  Home anticoagulation:  none - frequent falls preventing use  . Not an AC candidate given severe SOB, medical issues, consideration of palliative care    Hypertension  Stable on the high side . Permissive hypertension (OK if < 220/120) but gradually normalize in 5-7 days . Long-term BP goal normotensive  Hyperlipidemia  Home meds:  lipitor 20 and zetia 10, zetia resumed in hospital. Will also add lipitor  LDL 83, goal < 70  Continue statin at discharge  Diabetes type II Controlled  HgbA1c 5.8, goal < 7.0  Other Stroke Risk Factors  Advanced age  Former Cigarette smoker  Obesity, Body mass index is 34.92 kg/m., recommend weight loss, diet and exercise as appropriate   Family hx stroke (brother)  CAD  Other Active Problems  Baseline mild dementia and falls followed by Dr. Felecia Shelling at [facility]. Recent falls and weakness  Dyspnea in setting of COPD. Stat breathing tx. Discussed w/ RN  ? LUE DVT. For [place].   CKD  Meningioma   Lumbago  GERD  Subclavian stenosis   Hospital day # 1 Patient has baseline dementia and now has a disabling stroke with left hemiplegia as well as new onset of congestive heart failure.  Her prognosis is quite poor and she will likely need 24-hour nursing care in a skilled nursing facility which the family does not want.  Daughter agrees to DNR and comfort care measures and likely transfer to hospice nursing home soon.  Greater than 50% time during this 25-minute visit was spent in counseling and coordination of care and answering questions about her prognosis and plan of care.  Stroke team will sign off.  Kindly call for questions  Antony Contras, MD  To contact Stroke Continuity provider, please refer to [email or web address] After hours, contact General  Neurology

## 2020-06-13 NOTE — Hospital Course (Addendum)
Cheryl Summers is a 79 y.o. female with a history of dementia, CAD s/p CABG, T2DM, HTN, HLD, paroxysmal A. Fib, COPD, HFpEF who presented with unilateral left-sided weakness found to have CVA, outside of tPA window. Hospital course outlined by problem below:  CVA Acute right precentral gyrus infarct. Patient initially presented to the ED as a code stroke in setting of acute onset left-sided weakness.  Last known well the night of 8/17, son went to her house to take her to a doctor's appointment around 10:30 AM on 8/18 when he found her slumped on the couch and unable to move her left arm.  Patient was outside of the window for tPA at time of presentation to ED. NIHSS 9 on presentation. CT head without contrast showed no evidence of intracranial hemorrhage.  CTA head and neck showed evidence of small acute infarct of the right precentral gyrus.  Exam with left-sided weakness, UE > LE, with mild facial droop.  Once patient passed the stroke swallow screen, patient was started on ASA 325 mg daily, clopidogrel 75 mg, and her atorvastatin was increased to 80 mg.  Left hemiplegia persisted throughout admission and remained unchanged.  Acute HFrEF Patient presented with dyspnea worse than baseline and pitting edema.  She has a history of HFpEF followed by cardiology, on torsemide.  Her torsemide dose was recently decreased to 20 mg on MWF.  She was found to have an elevated BNP of 2,163 (baseline 400s).  TTE was obtained which revealed EF 30 to 35% with diffuse akinesis and severely decreased LV function, worse compared to prior echo in 08/2019 showing EF 60 to 65%.  Patient was diuresed with a one-time dose of IV furosemide 80 mg and responded well.  She was started on metoprolol tartrate 12.5 mg twice daily and losartan 12.5 mg. Net I/O -2.1L and weight 86.6 kg on discharge.  AMS Patient with underlying history of dementia with recent MMSE 16 per chart.  Notable decline in mental status compared to baseline,  likely due to CVA and possible delirium.  On day of discharge, patient had diminished attention, registration and recall and was oriented to self only.  Also with significant difficulty following commands, mumbling "okay."  Dysphagia Patient with dysphagia following CVA.  Patient was evaluated by SLP and was placed on dysphagia 1 diet.  On day of discharge, patient was notably refusing to take p.o. medications.  COPD Patient presented with notable wheeze on exam.  Patient was treated with [facility], scheduled DuoNeb, montelukast, and albuterol nebulizer as needed.  HTN Her home clonidine was decreased to 0.1 mg twice daily.  Her other home medications were held due to permissive hypertension post CVA.  She was started on metoprolol tartrate and losartan after TTE revealed significant worsening in EF.  T2DM Her home Wilder Glade was held.  A1c this admission was 5.8%.  Euglycemic throughout admission.  CKD Apparently creatinine of 2.7 at cardiologist office 2 weeks ago.  Patient's creatinine on admission 2.07, GFR 22.  Unclear what baseline is, but improved from previous reading 2 weeks prior to admission. Cr 1.64 on discharge, likely around baseline.  Meningioma Stable on MRI this admission.  A Fib Patient with history of atrial fibrillation, not on anticoagulation due to gait dysfunction and high risk of falls.  She remained off of anticoagulation.  Goals of care Palliative care was consulted to assist with [place] discussion given recent CVA and heart failure with many other medical comorbidities.  It was ultimately decided to have  patient go on hospice/CMO.  Other problems chronic and stable

## 2020-06-13 NOTE — Discharge Summary (Signed)
[facility] Discharge Summary  Patient name: Cheryl Summers Medical record number: [id number] Date of birth: April 18, 1941 Age: 79 y.o. Gender: female Date of Admission: 06/11/2020  Date of Discharge: 06/13/2020  Admitting Physician: Zenia Resides, MD  Primary Care Provider: Nicholos Johns, MD Consultants: Neurology, cardiology, palliative care  Indication for Hospitalization: CVA  Discharge Diagnoses/Problem List:  CVA HFpEF COPD Dementia Altered mental status A Fib HTN T2DM Meningioma  Disposition: Residential Hospice  Discharge Condition: Stable  Discharge Exam:  General: Elderly woman lying in bed, hard of hearing, NAD Cardiovascular:RRR, no murmurs Respiratory:CTAB, some abdominal breathing noted, no tachypnea Abdomen:soft, non-tender Extremities:WWP, 1+ pitting edema Neuro: alert, oriented to self only, intermittently mumbling "okay", diminished attention, difficulty following commands but wiggles toes, able to lift up right arm, not moving left arm   Brief Hospital Course:  Cheryl Summers is a 79 y.o. female with a history of dementia, CAD s/p CABG, T2DM, HTN, HLD, paroxysmal A. Fib, COPD, HFpEF who presented with unilateral left-sided weakness found to have CVA, outside of tPA window. Hospital course outlined by problem below:  CVA Acute right precentral gyrus infarct. Patient initially presented to the ED as a code stroke in setting of acute onset left-sided weakness.  Last known well the night of 8/17, son went to her house to take her to a doctor's appointment around 10:30 AM on 8/18 when he found her slumped on the couch and unable to move her left arm.  Patient was outside of the window for tPA at time of presentation to ED. NIHSS 9 on presentation. CT head without contrast showed no evidence of intracranial hemorrhage.  CTA head and neck showed evidence of small acute infarct of the right precentral gyrus.  Exam with left-sided  weakness, UE > LE, with mild facial droop.  Once patient passed the stroke swallow screen, patient was started on ASA 325 mg daily, clopidogrel 75 mg, and her atorvastatin was increased to 80 mg.  Left hemiplegia persisted throughout admission and remained unchanged.  Acute HFrEF Patient presented with dyspnea worse than baseline and pitting edema.  She has a history of HFpEF followed by cardiology, on torsemide.  Her torsemide dose was recently decreased to 20 mg on MWF.  She was found to have an elevated BNP of 2,163 (baseline 400s).  TTE was obtained which revealed EF 30 to 35% with diffuse akinesis and severely decreased LV function, worse compared to prior echo in 08/2019 showing EF 60 to 65%.  Patient was diuresed with a one-time dose of IV furosemide 80 mg and responded well.  She was started on metoprolol tartrate 12.5 mg twice daily and losartan 12.5 mg. Net I/O -2.1L and weight 86.6 kg on discharge.  AMS Patient with underlying history of dementia with recent MMSE 16 per chart.  Notable decline in mental status compared to baseline, likely due to CVA and possible delirium.  On day of discharge, patient had diminished attention, registration and recall and was oriented to self only.  Also with significant difficulty following commands, mumbling "okay."  Dysphagia Patient with dysphagia following CVA.  Patient was evaluated by SLP and was placed on dysphagia 1 diet.  On day of discharge, patient was notably refusing to take p.o. medications.  COPD Patient presented with notable wheeze on exam.  Patient was treated with [facility], scheduled DuoNeb, montelukast, and albuterol nebulizer as needed.  HTN Her home clonidine was decreased to 0.1 mg twice daily.  Her other home medications were  held due to permissive hypertension post CVA.  She was started on metoprolol tartrate and losartan after TTE revealed significant worsening in EF.  T2DM Her home Wilder Glade was held.  A1c this admission was 5.8%.   Euglycemic throughout admission.  CKD Apparently creatinine of 2.7 at cardiologist office 2 weeks ago.  Patient's creatinine on admission 2.07, GFR 22.  Unclear what baseline is, but improved from previous reading 2 weeks prior to admission. Cr 1.64 on discharge, likely around baseline.  Meningioma Stable on MRI this admission.  A Fib Patient with history of atrial fibrillation, not on anticoagulation due to gait dysfunction and high risk of falls.  She remained off of anticoagulation.  Goals of care Palliative care was consulted to assist with [place] discussion given recent CVA and heart failure with many other medical comorbidities.  It was ultimately decided to have patient go on hospice/CMO.  Other problems chronic and stable   Significant Procedures: none  Significant Labs and Imaging:  Recent Labs  Lab 06/11/20 1211  WBC 19.0*  HGB 13.0  13.3  HCT 42.7  39.0  PLT 237   Recent Labs  Lab 06/11/20 1211 06/11/20 1211 06/12/20 1041 06/13/20 0205  NA 145  144  --  144 144  K 4.6  4.3   < > 4.1 3.4*  CL 111  113*  --  113* 110  CO2 18*  --  18* 22  GLUCOSE 112*  112*  --  191* 130*  BUN 28*  31*  --  26* 34*  CREATININE 1.89*  1.80*  --  1.53* 1.64*  CALCIUM 9.5  --  9.4 9.2  PHOS  --   --   --  3.1  ALKPHOS 97  --   --   --   AST 40  --   --   --   ALT 25  --   --   --   ALBUMIN 4.0  --   --  3.8   < > = values in this interval not displayed.     Results/Tests Pending at Time of Discharge: none  Discharge Medications:  Allergies as of 06/13/2020   No Known Allergies     Medication List    STOP taking these medications   albuterol (2.5 MG/3ML) 0.083% nebulizer solution Commonly known as: PROVENTIL   albuterol 108 (90 Base) MCG/ACT inhaler Commonly known as: VENTOLIN HFA   allopurinol 100 MG tablet Commonly known as: ZYLOPRIM   amLODipine 5 MG tablet Commonly known as: NORVASC   aspirin EC 81 MG tablet   atorvastatin 20 MG tablet Commonly  known as: LIPITOR   buPROPion 150 MG 24 hr tablet Commonly known as: WELLBUTRIN XL   cloNIDine 0.2 MG tablet Commonly known as: CATAPRES   donepezil 10 MG tablet Commonly known as: ARICEPT   ezetimibe 10 MG tablet Commonly known as: ZETIA   Farxiga 10 MG Tabs tablet Generic drug: dapagliflozin propanediol   fluticasone 50 MCG/ACT nasal spray Commonly known as: FLONASE   memantine 28 MG Cp24 24 hr capsule Commonly known as: NAMENDA XR   montelukast 10 MG tablet Commonly known as: SINGULAIR   multivitamin tablet   nystatin-triamcinolone cream Commonly known as: MYCOLOG II   omeprazole 20 MG capsule Commonly known as: PRILOSEC   polyethylene glycol 17 g packet Commonly known as: MiraLax   pregabalin 75 MG capsule Commonly known as: LYRICA   sertraline 50 MG tablet Commonly known as: ZOLOFT   sodium chloride 0.65 %  Soln nasal spray Commonly known as: OCEAN   Soothe XP Soln   telmisartan 80 MG tablet Commonly known as: MICARDIS   tiZANidine 2 MG tablet Commonly known as: ZANAFLEX   torsemide 20 MG tablet Commonly known as: DEMADEX     TAKE these medications   acetaminophen 325 MG tablet Commonly known as: TYLENOL Take 2 tablets (650 mg total) by mouth every 4 (four) hours as needed for mild pain (or temp > 37.5 C (99.5 F)). What changed:   medication strength  how much to take  when to take this  reasons to take this   acetaminophen 650 MG suppository Commonly known as: TYLENOL Place 1 suppository (650 mg total) rectally every 4 (four) hours as needed for mild pain (or temp > 37.5 C (99.5 F)). What changed: You were already taking a medication with the same name, and this prescription was added. Make sure you understand how and when to take each.   acetaminophen 160 MG/5ML solution Commonly known as: TYLENOL Place 20.3 mLs (650 mg total) into feeding tube every 4 (four) hours as needed for mild pain (or temp > 37.5 C (99.5 F)). What changed:  You were already taking a medication with the same name, and this prescription was added. Make sure you understand how and when to take each.   antiseptic oral rinse Liqd Apply 15 mLs topically as needed for dry mouth.   atropine 1 % ophthalmic solution Place 2 drops under the tongue 3 (three) times daily.   bisacodyl 10 MG suppository Commonly known as: DULCOLAX Place 1 suppository (10 mg total) rectally daily as needed for moderate constipation.   budesonide-formoterol 160-4.5 MCG/ACT inhaler Commonly known as: SYMBICORT Inhale 2 puffs into the lungs 2 (two) times daily as needed (shortness of breath).   haloperidol 0.5 MG tablet Commonly known as: HALDOL Take 1 tablet (0.5 mg total) by mouth every 4 (four) hours as needed for agitation (or delirium).   haloperidol 2 MG/ML solution Commonly known as: HALDOL Place 0.3 mLs (0.6 mg total) under the tongue every 4 (four) hours as needed for agitation (or delirium).   haloperidol lactate 5 MG/ML injection Commonly known as: HALDOL Inject 0.1 mLs (0.5 mg total) into the vein every 4 (four) hours as needed (or delirium).   ipratropium-albuterol 0.5-2.5 (3) MG/3ML Soln Commonly known as: DUONEB Take 3 mLs by nebulization every 4 (four) hours.   LORazepam 1 MG tablet Commonly known as: ATIVAN Take 1 tablet (1 mg total) by mouth every hour as needed for anxiety, seizure or sedation.   LORazepam 2 MG/ML concentrated solution Commonly known as: ATIVAN Place 0.5 mLs (1 mg total) under the tongue every hour as needed for anxiety.   LORazepam 2 MG/ML injection Commonly known as: ATIVAN Inject 0.5 mLs (1 mg total) into the vein every hour as needed for anxiety.   morphine 10 MG/5ML solution Take 1.3 mLs (2.6 mg total) by mouth every hour as needed (Pain/Dyspnea).   morphine 2 MG/ML injection Inject 0.5 mLs (1 mg total) into the vein every hour as needed (Pain/Dyspnea).   ondansetron 4 MG disintegrating tablet Commonly known as:  ZOFRAN-ODT Take 1 tablet (4 mg total) by mouth every 6 (six) hours as needed for nausea.   ondansetron 4 MG/2ML Soln injection Commonly known as: ZOFRAN Inject 2 mLs (4 mg total) into the vein every 6 (six) hours as needed for nausea.   polyvinyl alcohol 1.4 % ophthalmic solution Commonly known as: LIQUIFILM TEARS Place 1  drop into both eyes daily as needed for dry eyes.       Discharge Instructions: Please refer to Patient Instructions section of EMR for full details.  Patient was counseled important signs and symptoms that should prompt return to medical care, changes in medications, dietary instructions, activity restrictions, and follow up appointments.     Daisy Floro, DO 06/13/2020, 2:13 PM PGY-3, [place]

## 2020-06-13 NOTE — TOC Transition Note (Signed)
Transition of Care [facility]) - CM/SW Discharge Note   Patient Details  Name: Cheryl Summers MRN: [id number] Date of Birth: 1940-12-02  [facility]) CM/SW Contact:  Pollie Friar, RN Phone Number: 06/13/2020, 12:45 PM   Clinical Narrative:    Pt is discharging to [place]. Pt will transport via [facility]. Will updated the bedside RN and d/c packet at the desk.   Number for report: [phone]   Final next level of care: [place] Barriers to Discharge: No Barriers Identified   Patient Goals and CMS Choice   CMS Medicare.gov Compare Post Acute Care list provided to:: Patient Represenative (must comment) Choice offered to / list presented to : Adult Children  Discharge Placement                       Discharge Plan and Services   Discharge Planning Services: CM Consult Post Acute Care Choice: Hospice                               Social Determinants of Health (SDOH) Interventions     Readmission Risk Interventions No flowsheet data found.

## 2020-06-13 NOTE — Progress Notes (Signed)
Inpatient Rehabilitation Admissions Coordinator  Noted plans for residential Hospice. We will sign off at this time.  Danne Baxter, RN, MSN Rehab Admissions Coordinator [phone] 06/13/2020 11:23 AM

## 2020-06-13 NOTE — Consult Note (Signed)
Palliative Medicine Inpatient Consult Note  Reason for consult:  Goals of Care "DNR, multiple comorbidities with recent stroke"  HPI:  Per intake H&P --> Nikisha A Messman is a 79 y.o. female presenting with unilateral left-sided weakness found to have CVA, outside of tPA window. PMH is significant for mild dementia, CAD s/p CABG, T2DM, HTN, HLD, paroxysmal A. Fib, COPD, HFpEF.  [place] has suffered a debilitating cerebral infarction --> R frontal parietal lobe to occipital lobe infarct watershed territory.  Palliative care was consulted to aid in [place] conversations.   Clinical Assessment/Goals of Care: I have reviewed medical records including EPIC notes, labs and imaging, received report from bedside RN who stated that patient would not allow her to give any medications. Assessed the patient who was coughing quite a bit - performed oral suctioning. Patient unable to follow commands   I met with Tacey Ruiz (daughter) to further discuss diagnosis prognosis, GOC, EOL wishes, disposition and options.   I introduced Palliative Medicine as specialized medical care for people living with serious illness. It focuses on providing relief from the symptoms and stress of a serious illness. The goal is to improve quality of life for both the patient and the family.  I asked [name] to tell me about her mother. She is from [facility]. She was married and has a son and a daughter. 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 She is a woman who was known to be quite independent. She is a religious woman and practices the [employer].   Prior to hospitalization Andilynn was living on her own. She had been resistant towards caregivers prior. He daughter had camera throughout Alayziah's home to keep a constant eye o.her.Tykeria was doing all of her bADLs for her self with the exception of cooking. Her daughter would bring her food daily.  Quantia  had fallen in the yard on Friday and a neighbor had helped her up. Her daughter states that she was "okay" after that and remained to do her household tasks as she normally would. She then noticed on her camera that should could not get off the couch her son went to check on her and found her unable to move her left arm.   [name] had spoke with the neurology team prior to my entering. She states that Dr. Leonie Man shared that [facility] had a significant stroke and would be better served going to hospice than trying to rehabilitate.   A detailed discussion was had today regarding advanced directives, her daughter brought a copy of these in and they have been sent to [facility].  Concepts specific to code status, artifical feeding and hydration, continued IV antibiotics and rehospitalization was had.   I completed a MOST form today. The patient and family outlined their wishes for the following treatment decisions:  Cardiopulmonary Resuscitation: Do Not Attempt Resuscitation (DNR/No CPR)  Medical Interventions: Comfort Measures: Keep clean, warm, and dry. Use medication by any route, positioning, wound care, and other measures to relieve pain and suffering. Use oxygen, suction and manual treatment of airway obstruction as needed for comfort. Do not transfer to the hospital unless comfort needs cannot be met in current location.  Antibiotics: Determine use of limitation of antibiotics when infection occurs  IV Fluids: No IV fluids (provide other measures to ensure comfort)  Feeding Tube: No feeding tube   The difference between a aggressive medical intervention path  and a palliative comfort care path for this patient at this time was had. [name] shares that her mother would not wish to live dependent on others. She states that she has been through hospice with various family members and feels that this is the best next step for her mother.  We talked about transition to comfort measures in house and what that would  entail inclusive of medications to control pain, dyspnea, agitation, nausea, itching, and hiccups.  We discussed stopping all uneccessary measures such as blood draws, needle sticks, and frequent vital signs.   Discussed the importance of continued conversation with family and their  medical providers regarding overall plan of care and treatment options, ensuring decisions are within the context of the patients values and GOCs.  Provided "Gone From My Site" booklet.  Decision Maker: Tacey Ruiz (Daughter) [phone]  SUMMARY OF RECOMMENDATIONS   DNAR/DNI  Comfort oriented Care  TOC --> Placement at [place] Status/Advance Care Planning: DNAR/DNI  Symptom Management:  Dyspnea: Pain:  - Morphine 9m PO/SL Q1H PRN  - Morphine mg IV Q1H PRN Wheeze:  - Duonebs Q4H ATC Fever:  - Tylenol 6563mPO/PR Q6H PRN Agitation:  - Haldol 0.79m32mO/SL/IV Q4H PRN Anxiety:  - Lorazepam 1mg21m/SL/IV  Q1H PRN Nausea:  - Zofran 4mg 76mIV Q6H PRN  Secretions:  - Atropine 2gtt TID Dry Eyes:  - Artificial Tears PRN Xerostomia:  - Biotene 179ml 53m - BID oral care Urinary Retention:  - Bladder scan if > 250ml p74m and maintain foley  Constipation:  - Bisacodyl 10mg PR67m QDay Spiritual:  - Chaplain consult   Palliative Prophylaxis:   Oral Care, Aspiration, Delirium   Additional Recommendations (Limitations, Scope, Preferences):  Comfort oriented care   Psycho-social/Spiritual:   Desire for further Chaplaincy support:Yes - [employer]   Additional Recommendations: Education on hospice   Prognosis: Poor - No PO's likely weeks  Dis[facility] Planning[facility]   This conversation/these recommendations were discussed with patient primary care team, Dr. Sun  TimNancy Fettern:  1030 Time Out: 1140 Total Time: 70 Greater than 50%  of this time was spent counseling and coordinating care related to the above  assessment and plan.  [place] Cell Phone: [phone] secure chat with additional questions, if there is no response within 30 minutes please call the above phone number  Palliative Medicine Team providers are available by phone from 7am to 7pm daily and can be reached through the team cell phone.  Should this patient require assistance outside of these hours, please call the patient's attending physician.

## 2020-06-13 NOTE — TOC Initial Note (Signed)
Transition of Care [facility]) - Initial/Assessment Note    Patient Details  Name: Cheryl Summers MRN: [id number] Date of Birth: Oct 01, 1941  Transition of Care [facility]) CM/SW Contact:    Pollie Friar, RN Phone Number: 06/13/2020, 11:07 AM  Clinical Narrative:                 CM consulted per Neurology for residential hospice. CM met with the patient and her daughter: [name] at the bedside. [name] is asking for Residential hospice in [place]. CM has reached out to [place] with [facility]. She will updated CM once she has assessed the pt and spoken to the family.  TOC following.  Expected Discharge Plan: [place] Barriers to Discharge: Continued Medical Work up   Patient Goals and CMS Choice   CMS Medicare.gov Compare Post Acute Care list provided to:: Patient Represenative (must comment) Choice offered to / list presented to : Adult Children  Expected Discharge Plan and Services Expected Discharge Plan: [place]   Discharge Planning Services: CM Consult Post Acute Care Choice: Hospice                                        Prior Living Arrangements/Services   Lives with:: Self Patient language and need for interpreter reviewed:: Yes        Need for Family Participation in Patient Care: Yes (Comment) Care giver support system in place?: No (comment)   Criminal Activity/Legal Involvement Pertinent to Current Situation/Hospitalization: No - Comment as needed  Activities of Daily Living      Permission Sought/Granted                  Emotional Assessment           Psych Involvement: No (comment)  Admission diagnosis:  SOB (shortness of breath) [R06.02] CVA (cerebral vascular accident) ([place]) [I63.9] Stroke, acute, embolic [facility]) [Q75.9] Patient Active Problem List   Diagnosis Date Noted  . 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 Wheezing 06/12/2020  . Subclavian artery stenosis,  left ([place]) 06/12/2020  . Acute systolic heart failure ([place]) 06/12/2020  . Chronic kidney disease, stage 3b 06/12/2020  . CVA (cerebral vascular accident) ([place]) 06/11/2020  . SOB (shortness of breath)   . Numbness 12/03/2019  . Memory loss 12/03/2019  . Meningioma ([place]) 12/03/2019  . Gait disturbance 12/03/2019  . Diabetic polyneuropathy associated with type 2 diabetes mellitus ([place]) 12/03/2019  . Dizziness 09/18/2019  . Frequent falls 09/18/2019  . Renal insufficiency 06/07/2018  . Hypotension   . Spinal stenosis, lumbar region with neurogenic claudication 06/06/2018    Class: Acute  . Spondylolisthesis, lumbar region 06/06/2018    Class: Chronic  . Status post lumbar spinal fusion 06/06/2018  . Murmur, heart 02/24/2018  . Bradycardia 01/19/2018  . Coronary artery disease involving native coronary artery of native heart with angina pectoris ([place]) 05/29/2017  . Tobacco abuse 05/13/2017  . Hypertension 05/13/2017  . Hyperlipidemia 05/13/2017  . GERD (gastroesophageal reflux disease) 05/13/2017  . COPD (chronic obstructive pulmonary disease) ([place]) 05/13/2017  . Carotid artery stenosis 05/13/2017  . Hypertensive heart disease with heart failure ([place]) 06/09/2015  . PAF (paroxysmal atrial fibrillation) ([place]) 06/09/2015   PCP:  Nicholos Johns, MD Pharmacy:   [facility], [place] [address] Phone: [phone] Fax:  [phone]     Social Determinants of Health (SDOH) Interventions    Readmission Risk Interventions No flowsheet data found.

## 2020-06-13 NOTE — Progress Notes (Signed)
Progress Note  Patient Name: Cheryl Summers Date of Encounter: 06/13/2020  [facility] Cardiologist: No primary care provider on file.   Subjective   Troponin came back elevated. EKG ordered. Spoke with patient's daughter, plan is to switch to comfort care.   Inpatient Medications    Scheduled Meds: . aspirin  150 mg Rectal Daily  . atorvastatin  20 mg Oral QHS  . buPROPion  150 mg Oral QHS  . cloNIDine  0.1 mg Oral BID  . clopidogrel  75 mg Oral Daily  . donepezil  10 mg Oral Daily  . ezetimibe  5 mg Oral QHS  . heparin  5,000 Units Subcutaneous Q8H  . ipratropium-albuterol  3 mL Nebulization Q6H  . losartan  12.5 mg Oral Daily  . memantine  28 mg Oral Daily  . metoprolol tartrate  12.5 mg Oral BID   Or  . metoprolol tartrate  12.5 mg Oral BID  . mometasone-formoterol  2 puff Inhalation BID  . montelukast  10 mg Oral QHS  . pantoprazole  40 mg Oral Daily  . potassium chloride  40 mEq Oral Once  . pregabalin  75 mg Oral BID  . sertraline  50 mg Oral QHS   Continuous Infusions:  PRN Meds: acetaminophen **OR** acetaminophen (TYLENOL) oral liquid 160 mg/5 mL **OR** acetaminophen, albuterol, polyvinyl alcohol   Vital Signs    Vitals:   06/12/20 1939 06/12/20 2347 06/13/20 0333 06/13/20 1007  BP: 116/84 122/79 130/77 (!) 170/78  Pulse: 93 91 96 92  Resp: 18 18 18  (!) 24  Temp: 99 F (37.2 C) 98.5 F (36.9 C) 98.1 F (36.7 C) 98.4 F (36.9 C)  TempSrc: Oral Oral Oral Axillary  SpO2: 97% 98% 98% 95%  Weight:      Height:        Intake/Output Summary (Last 24 hours) at 06/13/2020 1043 Last data filed at 06/13/2020 0334 Gross per 24 hour  Intake --  Output 2100 ml  Net -2100 ml   Last 3 Weights 06/12/2020 06/11/2020 06/02/2020  Weight (lbs) 190 lb 14.7 oz 200 lb 192 lb  Weight (kg) 86.6 kg 90.719 kg 87.091 kg      Telemetry    NSR PVCs, PACs, HR 80-90s - Personally Reviewed  ECG    pending - Personally Reviewed  Physical Exam   GEN: No acute  distress.   Neck: No JVD Cardiac: RRR, no murmurs, rubs, or gallops.  Respiratory: diminished sounds, wheezing GI: Soft, nontender, non-distended  MS: No edema; No deformity. Neuro:  Nonfocal  Psych: Normal affect   Labs    High Sensitivity Troponin:   Recent Labs  Lab 06/12/20 1854 06/12/20 2120  TROPONINIHS 2,943* 3,488*      Chemistry Recent Labs  Lab 06/11/20 1211 06/12/20 1041 06/13/20 0205  NA 145  144 144 144  K 4.6  4.3 4.1 3.4*  CL 111  113* 113* 110  CO2 18* 18* 22  GLUCOSE 112*  112* 191* 130*  BUN 28*  31* 26* 34*  CREATININE 1.89*  1.80* 1.53* 1.64*  CALCIUM 9.5 9.4 9.2  PROT 7.2  --   --   ALBUMIN 4.0  --  3.8  AST 40  --   --   ALT 25  --   --   ALKPHOS 97  --   --   BILITOT 0.7  --   --   GFRNONAA 24* 32* 29*  GFRAA 28* 37* 34*  ANIONGAP 16* 13 12  Hematology Recent Labs  Lab 06/11/20 1211  WBC 19.0*  RBC 4.43  HGB 13.0  13.3  HCT 42.7  39.0  MCV 96.4  MCH 29.3  MCHC 30.4  RDW 14.2  PLT 237    BNP Recent Labs  Lab 06/11/20 2005  BNP 2,163.4*     DDimer No results for input(s): DDIMER in the last 168 hours.   Radiology    MR BRAIN WO CONTRAST  Result Date: 06/11/2020 CLINICAL DATA:  Stroke.  Left-sided weakness. EXAM: MRI HEAD WITHOUT CONTRAST TECHNIQUE: Multiplanar, multiecho pulse sequences of the brain and surrounding structures were obtained without intravenous contrast. COMPARISON:  CT angio head and neck 8 18 2021.  MRI head 06/05/2020 FINDINGS: Brain: Acute infarct in the right frontal parietal lobe extending to the occipital lobe. This is a watershed territory infarct. This was not present on the recent MRI of 06/05/2020. Calcific extra-axial mass in the left posterior parietal lobe measuring 18 mm compatible with meningioma. No brain edema. Generalized atrophy with mild chronic microvascular ischemic change in the white matter and pons. Chronic microhemorrhage in the left middle frontal lobe. Vascular: Normal  arterial flow voids with stenosis in the distal left vertebral artery. Skull and upper cervical spine: No focal skeletal lesion. Sinuses/Orbits: Mild mucosal edema paranasal sinuses. Bilateral cataract extraction Other: None IMPRESSION: Acute infarct right frontal parietal lobe extending to the occipital lobe. This is a watershed territory infarct without associated hemorrhage. 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 No brain edema. Electronically Signed   By: Franchot Gallo M.D.   On: 06/11/2020 18:02   CT CEREBRAL PERFUSION W CONTRAST  Result Date: 06/11/2020 CLINICAL DATA:  Left-sided weakness EXAM: CT HEAD CODE STROKE CT ANGIOGRAPHY HEAD AND NECK CT PERFUSION BRAIN TECHNIQUE: Multidetector CT imaging of the head and neck was performed using the standard protocol during bolus administration of intravenous contrast. Multiplanar CT image reconstructions and MIPs were obtained to evaluate the vascular anatomy. Carotid stenosis measurements (when applicable) are obtained utilizing NASCET criteria, using the distal internal carotid diameter as the denominator. Multiphase CT imaging of the brain was performed following IV bolus contrast injection. Subsequent parametric perfusion maps were calculated using RAPID software. CONTRAST:  100 mL Omnipaque 350 COMPARISON:  CT head 09/24/2019, MRI head 06/05/2020 FINDINGS: CT HEAD FINDINGS Brain: There is no acute intracranial hemorrhage or mass effect. There is a small area of hypoattenuation with loss of gray-white differentiation in the right frontal lobe involving the precentral gyrus. Additional patchy hypoattenuation in the supratentorial white matter is nonspecific but may reflect mild to moderate chronic microvascular ischemic changes. Prominence of the ventricles and sulci reflects generalized parenchymal volume loss. There is a calcified meningioma along the left parieto-occipital convexity  similar to the prior study. Vascular: No hyperdense vessel. Skull: Unremarkable. Sinuses/Orbits: Retained secretions within the right sphenoid. Patchy ethmoid mucosal thickening. No acute orbital finding. Other: Mastoid air cells are clear. ASPECTS [facility] Stroke Program Early CT Score) - Ganglionic level infarction (caudate, lentiform nuclei, internal capsule, insula, M1-M3 cortex): 7 - Supraganglionic infarction (M4-M6 cortex): 2 Total score (0-10 with 10 being normal): 9 Review of the MIP images confirms the above findings CTA NECK FINDINGS Aortic arch: Calcified and noncalcified plaque along the arch and great vessel origins. 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 Right carotid system: Patent. Multifocal calcified and noncalcified plaque along the common carotid with less than 50% stenosis. Mixed plaque is present at the ICA  origin causing approximately 60% stenosis. Left carotid system: Patent. Calcified and noncalcified plaque along the common carotid causing less than 50% stenosis. Mixed plaque at the ICA origin causing approximately 50% stenosis. There is subsequent additional mixed plaque causing at least 80% stenosis. Vertebral arteries: Patent. Right vertebral artery is dominant. Mixed plaque along the left V2 segment causing moderate stenosis. Skeleton: Degenerative changes of the cervical spine. Other neck: Heterogeneous, enlarged left thyroid, which has been previously evaluated by ultrasound. Upper chest: Small bilateral pleural effusions. Interlobular septal thickening and patchy ground-glass density, which may reflect edema. Review of the MIP images confirms the above findings CTA HEAD FINDINGS Anterior circulation: Intracranial internal carotid arteries are patent with calcified plaque causing moderate stenosis. Anterior and middle cerebral arteries are patent. Posterior circulation: Intracranial vertebral arteries are patent. There is mixed plaque causing mild stenosis on the  right and marked stenosis on the left. Patent PICA origins. Basilar artery is patent. Posterior cerebral arteries are patent. Venous sinuses: As permitted by contrast timing, patent. Review of the MIP images confirms the above findings CT Brain Perfusion Findings: CBF (<30%) Volume: 72mL Perfusion (Tmax>6.0s) volume: 47mL Mismatch Volume: 26mL Infarction Location: None IMPRESSION: No acute intracranial hemorrhage. Suspected small acute infarction involving the right precentral gyrus (ASPECT score 9). No large vessel occlusion. No evidence of core infarction or territory at risk by perfusion imaging. Plaque along common and internal carotid arteries bilaterally. Approximately 60% stenosis at the right ICA origin. Approximately 50% stenosis at the left ICA origin with subsequent plaque causing 80% stenosis. Moderate stenosis of the intracranial internal carotid arteries. 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 Partially imaged small bilateral pleural effusions and suspected mild pulmonary edema. Initial results were communicated to Dr. Leonel Ramsay at 12:35 pmon 8/18/2021by text page via the [facility] messaging system. Electronically Signed   By: Macy Mis M.D.   On: 06/11/2020 13:02   DG Chest Port 1 View  Result Date: 06/11/2020 CLINICAL DATA:  Onset shortness of breath and weakness is morning. EXAM: PORTABLE CHEST 1 VIEW COMPARISON:  PA and lateral chest 09/17/2019. FINDINGS: Extensive bilateral airspace disease is somewhat worse on the right. There are small bilateral pleural effusions. Heart size is upper normal. The patient is status post CABG. IMPRESSION: Right greater than left airspace disease and small effusions have an appearance most suggestive of congestive failure rather than pneumonia. Electronically Signed   By: Inge Rise M.D.   On: 06/11/2020 13:52   ECHOCARDIOGRAM COMPLETE  Result Date: 06/12/2020    ECHOCARDIOGRAM REPORT   Patient Name:   ZION LINT Date of Exam:  06/12/2020 Medical Rec #:  [id number]      Height:       62.0 in Accession #:    [id number]     Weight:       190.9 lb Date of Birth:  Jan 07, 1941      BSA:          1.874 m Patient Age:    16 years       BP:           147/76 mmHg Patient Gender: F              HR:           110 bpm. Exam Location:  Inpatient Procedure: 2D Echo, 3D Echo, Cardiac Doppler and Color Doppler REPORT CONTAINS CRITICAL RESULT Indications:    Stroke  History:        Patient has prior history of  Echocardiogram examinations, most                 recent 09/24/2019. Abnormal ECG, Stroke and COPD,                 Arrythmias:Atrial Fibrillation, Signs/Symptoms:Murmur, Altered                 Mental Status, Dyspnea and Shortness of Breath; Risk                 Factors:Hypertension and Current Smoker.  Sonographer:    Roseanna Rainbow RDCS Referring Phys: [id number] DAN FLOYD IMPRESSIONS  1. LVEF is depressed with akinesis of the mid/distal septal, mid/distal inferior, distal anterior, distal lateral and apical walls. COmpared to echo in 2020, these changes are new.. Left ventricular ejection fraction, by estimation, is 30 to 35%. The left ventricle has moderate to severely decreased function. There is mild left ventricular hypertrophy. Left ventricular diastolic parameters are indeterminate.  2. Right ventricular systolic function is normal. The right ventricular size is normal. There is severely elevated pulmonary artery systolic pressure.  3. Left atrial size was mildly dilated.  4. The mitral valve is normal in structure. Mild mitral valve regurgitation.  5. Tricuspid valve regurgitation is mild to moderate.  6. The aortic valve is abnormal. Aortic valve regurgitation is trivial. Mild aortic valve sclerosis is present, with no evidence of aortic valve stenosis. FINDINGS  Left Ventricle: LVEF is depressed with akinesis of the mid/distal septal, mid/distal inferior, distal anterior, distal lateral and apical walls. COmpared to echo in 2020, these changes are  new. Left ventricular ejection fraction, by estimation, is 30 to  35%. The left ventricle has moderate to severely decreased function. The left ventricle demonstrates regional wall motion abnormalities. Definity contrast agent was given IV to delineate the left ventricular endocardial borders. The left ventricular internal cavity size was normal in size. There is mild left ventricular hypertrophy. Left ventricular diastolic parameters are indeterminate. Right Ventricle: The right ventricular size is normal. No increase in right ventricular wall thickness. Right ventricular systolic function is normal. There is severely elevated pulmonary artery systolic pressure. The tricuspid regurgitant velocity is 3.65 m/s, and with an assumed right atrial pressure of 15 mmHg, the estimated right ventricular systolic pressure is 47.4 mmHg. Left Atrium: Left atrial size was mildly dilated. Right Atrium: Right atrial size was normal in size. Pericardium: Trivial pericardial effusion is present. Mitral Valve: The mitral valve is normal in structure. Mild mitral valve regurgitation. Tricuspid Valve: The tricuspid valve is normal in structure. Tricuspid valve regurgitation is mild to moderate. Aortic Valve: The aortic valve is abnormal. Aortic valve regurgitation is trivial. Mild aortic valve sclerosis is present, with no evidence of aortic valve stenosis. Pulmonic Valve: The pulmonic valve was grossly normal. Pulmonic valve regurgitation is mild to moderate. Aorta: The aortic root is normal in size and structure. IAS/Shunts: The interatrial septum was not assessed.  LEFT VENTRICLE PLAX 2D LVIDd:         4.50 cm      Diastology LVIDs:         2.90 cm      LV e' lateral:   7.40 cm/s LV PW:         1.20 cm      LV E/e' lateral: 15.2 LV IVS:        1.30 cm      LV e' medial:    5.77 cm/s LVOT diam:     1.70 cm  LV E/e' medial:  19.5 LV SV:         40 LV SV Index:   21 LVOT Area:     2.27 cm  LV Volumes (MOD) LV vol d, MOD A2C: 101.0  ml LV vol d, MOD A4C: 82.7 ml LV vol s, MOD A2C: 46.3 ml LV vol s, MOD A4C: 55.7 ml LV SV MOD A2C:     54.7 ml LV SV MOD A4C:     82.7 ml LV SV MOD BP:      42.2 ml RIGHT VENTRICLE             IVC RV S prime:     10.70 cm/s  IVC diam: 1.80 cm TAPSE (M-mode): 1.5 cm LEFT ATRIUM             Index       RIGHT ATRIUM           Index LA diam:        4.40 cm 2.35 cm/m  RA Area:     14.60 cm LA Vol (A2C):   76.4 ml 40.76 ml/m RA Volume:   35.00 ml  18.67 ml/m LA Vol (A4C):   58.1 ml 31.00 ml/m LA Biplane Vol: 70.6 ml 37.67 ml/m  AORTIC VALVE             PULMONIC VALVE LVOT Vmax:   112.00 cm/s PR End Diast Vel: 3.17 msec LVOT Vmean:  73.400 cm/s LVOT VTI:    0.175 m  AORTA Ao Root diam: 3.00 cm Ao Asc diam:  3.15 cm MITRAL VALVE                TRICUSPID VALVE MV Area (PHT): 4.07 cm     TR Peak grad:   53.3 mmHg MV Decel Time: 187 msec     TR Vmax:        365.00 cm/s MR PISA:        1.01 cm MR PISA Radius: 0.40 cm     SHUNTS MV E velocity: 112.50 cm/s  Systemic VTI:  0.18 m MV A velocity: 73.30 cm/s   Systemic Diam: 1.70 cm MV E/A ratio:  1.53 Dorris Carnes MD Electronically signed by Dorris Carnes MD Signature Date/Time: 06/12/2020/12:56:11 PM    Final    CT HEAD CODE STROKE WO CONTRAST  Result Date: 06/11/2020 CLINICAL DATA:  Left-sided weakness EXAM: CT HEAD CODE STROKE CT ANGIOGRAPHY HEAD AND NECK CT PERFUSION BRAIN TECHNIQUE: Multidetector CT imaging of the head and neck was performed using the standard protocol during bolus administration of intravenous contrast. Multiplanar CT image reconstructions and MIPs were obtained to evaluate the vascular anatomy. Carotid stenosis measurements (when applicable) are obtained utilizing NASCET criteria, using the distal internal carotid diameter as the denominator. Multiphase CT imaging of the brain was performed following IV bolus contrast injection. Subsequent parametric perfusion maps were calculated using RAPID software. CONTRAST:  100 mL Omnipaque 350 COMPARISON:  CT  head 09/24/2019, MRI head 06/05/2020 FINDINGS: CT HEAD FINDINGS Brain: There is no acute intracranial hemorrhage or mass effect. There is a small area of hypoattenuation with loss of gray-white differentiation in the right frontal lobe involving the precentral gyrus. Additional patchy hypoattenuation in the supratentorial white matter is nonspecific but may reflect mild to moderate chronic microvascular ischemic changes. Prominence of the ventricles and sulci reflects generalized parenchymal volume loss. 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 Vascular: No hyperdense vessel. Skull: Unremarkable. Sinuses/Orbits:  Retained secretions within the right sphenoid. Patchy ethmoid mucosal thickening. No acute orbital finding. Other: Mastoid air cells are clear. ASPECTS [facility] Stroke Program Early CT Score) - Ganglionic level infarction (caudate, lentiform nuclei, internal capsule, insula, M1-M3 cortex): 7 - Supraganglionic infarction (M4-M6 cortex): 2 Total score (0-10 with 10 being normal): 9 Review of the MIP images confirms the above findings CTA NECK FINDINGS Aortic arch: Calcified and noncalcified plaque along the arch and great vessel origins. 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 Right carotid system: Patent. Multifocal calcified and noncalcified plaque along the common carotid with less than 50% stenosis. Mixed plaque is present at the ICA origin causing approximately 60% stenosis. Left carotid system: Patent. Calcified and noncalcified plaque along the common carotid causing less than 50% stenosis. Mixed plaque at the ICA origin causing approximately 50% stenosis. There is subsequent additional mixed plaque causing at least 80% stenosis. Vertebral arteries: Patent. Right vertebral artery is dominant. Mixed plaque along the left V2 segment causing moderate stenosis. Skeleton: Degenerative changes of the cervical spine. Other neck:  Heterogeneous, enlarged left thyroid, which has been previously evaluated by ultrasound. Upper chest: Small bilateral pleural effusions. Interlobular septal thickening and patchy ground-glass density, which may reflect edema. Review of the MIP images confirms the above findings CTA HEAD FINDINGS Anterior circulation: Intracranial internal carotid arteries are patent with calcified plaque causing moderate stenosis. Anterior and middle cerebral arteries are patent. Posterior circulation: Intracranial vertebral arteries are patent. There is mixed plaque causing mild stenosis on the right and marked stenosis on the left. Patent PICA origins. Basilar artery is patent. Posterior cerebral arteries are patent. Venous sinuses: As permitted by contrast timing, patent. Review of the MIP images confirms the above findings CT Brain Perfusion Findings: CBF (<30%) Volume: 72mL Perfusion (Tmax>6.0s) volume: 17mL Mismatch Volume: 54mL Infarction Location: None IMPRESSION: No acute intracranial hemorrhage. Suspected small acute infarction involving the right precentral gyrus (ASPECT score 9). No large vessel occlusion. No evidence of core infarction or territory at risk by perfusion imaging. Plaque along common and internal carotid arteries bilaterally. Approximately 60% stenosis at the right ICA origin. Approximately 50% stenosis at the left ICA origin with subsequent plaque causing 80% stenosis. Moderate stenosis of the intracranial internal carotid arteries. 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 Partially imaged small bilateral pleural effusions and suspected mild pulmonary edema. Initial results were communicated to Dr. Leonel Ramsay at 12:35 pmon 8/18/2021by text page via the [facility] messaging system. Electronically Signed   By: Macy Mis M.D.   On: 06/11/2020 13:02   CT ANGIO HEAD CODE STROKE  Result Date: 06/11/2020 CLINICAL DATA:  Left-sided weakness EXAM: CT HEAD CODE STROKE CT ANGIOGRAPHY HEAD AND  NECK CT PERFUSION BRAIN TECHNIQUE: Multidetector CT imaging of the head and neck was performed using the standard protocol during bolus administration of intravenous contrast. Multiplanar CT image reconstructions and MIPs were obtained to evaluate the vascular anatomy. Carotid stenosis measurements (when applicable) are obtained utilizing NASCET criteria, using the distal internal carotid diameter as the denominator. Multiphase CT imaging of the brain was performed following IV bolus contrast injection. Subsequent parametric perfusion maps were calculated using RAPID software. CONTRAST:  100 mL Omnipaque 350 COMPARISON:  CT head 09/24/2019, MRI head 06/05/2020 FINDINGS: CT HEAD FINDINGS Brain: There is no acute intracranial hemorrhage or mass effect. There is a small area of hypoattenuation with loss of gray-white differentiation in the right frontal lobe involving the precentral gyrus. Additional patchy hypoattenuation in the supratentorial white matter is  nonspecific but may reflect mild to moderate chronic microvascular ischemic changes. Prominence of the ventricles and sulci reflects generalized parenchymal volume loss. 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 Vascular: No hyperdense vessel. Skull: Unremarkable. Sinuses/Orbits: Retained secretions within the right sphenoid. Patchy ethmoid mucosal thickening. No acute orbital finding. Other: Mastoid air cells are clear. ASPECTS [facility] Stroke Program Early CT Score) - Ganglionic level infarction (caudate, lentiform nuclei, internal capsule, insula, M1-M3 cortex): 7 - Supraganglionic infarction (M4-M6 cortex): 2 Total score (0-10 with 10 being normal): 9 Review of the MIP images confirms the above findings CTA NECK FINDINGS Aortic arch: Calcified and noncalcified plaque along the arch and great vessel origins. 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 Right carotid system: Patent.  Multifocal calcified and noncalcified plaque along the common carotid with less than 50% stenosis. Mixed plaque is present at the ICA origin causing approximately 60% stenosis. Left carotid system: Patent. Calcified and noncalcified plaque along the common carotid causing less than 50% stenosis. Mixed plaque at the ICA origin causing approximately 50% stenosis. There is subsequent additional mixed plaque causing at least 80% stenosis. Vertebral arteries: Patent. Right vertebral artery is dominant. Mixed plaque along the left V2 segment causing moderate stenosis. Skeleton: Degenerative changes of the cervical spine. Other neck: Heterogeneous, enlarged left thyroid, which has been previously evaluated by ultrasound. Upper chest: Small bilateral pleural effusions. Interlobular septal thickening and patchy ground-glass density, which may reflect edema. Review of the MIP images confirms the above findings CTA HEAD FINDINGS Anterior circulation: Intracranial internal carotid arteries are patent with calcified plaque causing moderate stenosis. Anterior and middle cerebral arteries are patent. Posterior circulation: Intracranial vertebral arteries are patent. There is mixed plaque causing mild stenosis on the right and marked stenosis on the left. Patent PICA origins. Basilar artery is patent. Posterior cerebral arteries are patent. Venous sinuses: As permitted by contrast timing, patent. Review of the MIP images confirms the above findings CT Brain Perfusion Findings: CBF (<30%) Volume: 94mL Perfusion (Tmax>6.0s) volume: 72mL Mismatch Volume: 77mL Infarction Location: None IMPRESSION: No acute intracranial hemorrhage. Suspected small acute infarction involving the right precentral gyrus (ASPECT score 9). No large vessel occlusion. No evidence of core infarction or territory at risk by perfusion imaging. Plaque along common and internal carotid arteries bilaterally. Approximately 60% stenosis at the right ICA origin.  Approximately 50% stenosis at the left ICA origin with subsequent plaque causing 80% stenosis. Moderate stenosis of the intracranial internal carotid arteries. 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 Partially imaged small bilateral pleural effusions and suspected mild pulmonary edema. Initial results were communicated to Dr. Leonel Ramsay at 12:35 pmon 8/18/2021by text page via the [facility] messaging system. Electronically Signed   By: Macy Mis M.D.   On: 06/11/2020 13:02   CT ANGIO NECK CODE STROKE  Result Date: 06/11/2020 CLINICAL DATA:  Left-sided weakness EXAM: CT HEAD CODE STROKE CT ANGIOGRAPHY HEAD AND NECK CT PERFUSION BRAIN TECHNIQUE: Multidetector CT imaging of the head and neck was performed using the standard protocol during bolus administration of intravenous contrast. Multiplanar CT image reconstructions and MIPs were obtained to evaluate the vascular anatomy. Carotid stenosis measurements (when applicable) are obtained utilizing NASCET criteria, using the distal internal carotid diameter as the denominator. Multiphase CT imaging of the brain was performed following IV bolus contrast injection. Subsequent parametric perfusion maps were calculated using RAPID software. CONTRAST:  100 mL Omnipaque 350 COMPARISON:  CT head 09/24/2019, MRI head 06/05/2020  FINDINGS: CT HEAD FINDINGS Brain: There is no acute intracranial hemorrhage or mass effect. There is a small area of hypoattenuation with loss of gray-white differentiation in the right frontal lobe involving the precentral gyrus. Additional patchy hypoattenuation in the supratentorial white matter is nonspecific but may reflect mild to moderate chronic microvascular ischemic changes. Prominence of the ventricles and sulci reflects generalized parenchymal volume loss. 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 Vascular: No hyperdense vessel. Skull: Unremarkable.  Sinuses/Orbits: Retained secretions within the right sphenoid. Patchy ethmoid mucosal thickening. No acute orbital finding. Other: Mastoid air cells are clear. ASPECTS [facility] Stroke Program Early CT Score) - Ganglionic level infarction (caudate, lentiform nuclei, internal capsule, insula, M1-M3 cortex): 7 - Supraganglionic infarction (M4-M6 cortex): 2 Total score (0-10 with 10 being normal): 9 Review of the MIP images confirms the above findings CTA NECK FINDINGS Aortic arch: Calcified and noncalcified plaque along the arch and great vessel origins. 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 Right carotid system: Patent. Multifocal calcified and noncalcified plaque along the common carotid with less than 50% stenosis. Mixed plaque is present at the ICA origin causing approximately 60% stenosis. Left carotid system: Patent. Calcified and noncalcified plaque along the common carotid causing less than 50% stenosis. Mixed plaque at the ICA origin causing approximately 50% stenosis. There is subsequent additional mixed plaque causing at least 80% stenosis. Vertebral arteries: Patent. Right vertebral artery is dominant. Mixed plaque along the left V2 segment causing moderate stenosis. Skeleton: Degenerative changes of the cervical spine. Other neck: Heterogeneous, enlarged left thyroid, which has been previously evaluated by ultrasound. Upper chest: Small bilateral pleural effusions. Interlobular septal thickening and patchy ground-glass density, which may reflect edema. Review of the MIP images confirms the above findings CTA HEAD FINDINGS Anterior circulation: Intracranial internal carotid arteries are patent with calcified plaque causing moderate stenosis. Anterior and middle cerebral arteries are patent. Posterior circulation: Intracranial vertebral arteries are patent. There is mixed plaque causing mild stenosis on the right and marked stenosis on the left. Patent PICA origins. Basilar artery is  patent. Posterior cerebral arteries are patent. Venous sinuses: As permitted by contrast timing, patent. Review of the MIP images confirms the above findings CT Brain Perfusion Findings: CBF (<30%) Volume: 79mL Perfusion (Tmax>6.0s) volume: 20mL Mismatch Volume: 7mL Infarction Location: None IMPRESSION: No acute intracranial hemorrhage. Suspected small acute infarction involving the right precentral gyrus (ASPECT score 9). No large vessel occlusion. No evidence of core infarction or territory at risk by perfusion imaging. Plaque along common and internal carotid arteries bilaterally. Approximately 60% stenosis at the right ICA origin. Approximately 50% stenosis at the left ICA origin with subsequent plaque causing 80% stenosis. Moderate stenosis of the intracranial internal carotid arteries. 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 Partially imaged small bilateral pleural effusions and suspected mild pulmonary edema. Initial results were communicated to Dr. Leonel Ramsay at 12:35 pmon 8/18/2021by text page via the [facility] messaging system. Electronically Signed   By: Macy Mis M.D.   On: 06/11/2020 13:02    Cardiac Studies   Echo 06/12/20 1. LVEF is depressed with akinesis of the mid/distal septal, mid/distal  inferior, distal anterior, distal lateral and apical walls. COmpared to  echo in 2020, these changes are new.. Left ventricular ejection fraction,  by estimation, is 30 to 35%. The  left ventricle has moderate to severely decreased function. There is mild  left ventricular hypertrophy. Left ventricular diastolic parameters are  indeterminate.  2. Right ventricular systolic function is normal. The right ventricular  size is normal. There is severely elevated pulmonary artery systolic  pressure.  3. Left atrial size was mildly dilated.  4. The mitral valve is normal in structure. Mild mitral valve  regurgitation.  5. Tricuspid valve regurgitation is mild to moderate.  6.  The aortic valve is abnormal. Aortic valve regurgitation is trivial.  Mild aortic valve sclerosis is present, with no evidence of aortic valve  stenosis.    Patient Profile     79 y.o. 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 Will check an EKG - Metoprolol 50 mg daily - Losartan 25mg  daily -Patient was being diuresed with  IV lasix 80 mg BID started. She is net -2.1L. Weight down 10lbs. Diuretics held, agree with this, appears euvolemic on exam. - creatinine up from yesterday 1.53>1.64, although appears to be around baseline - EKG today. Unable to assess symptoms given dementia. Will discuss heparin with MD although appears the plan is to switch to comfort care.  Recent stroke - ASA and plavix  Advanced dementia - with poor mobility  Afib - not on a/c due to fall risk - EKG shows sinus  Goals of care - comfort care/hospice  For questions or updates, please contact [place] Please consult [email or web address] for contact info under        Signed, Danaly Bari Ninfa Meeker, PA-C  06/13/2020, 10:43 AM

## 2020-06-13 NOTE — Progress Notes (Signed)
FPTS Interim Progress Note  Went to check in on patient this evening. She was sleeping soundly and comfortably. She was breathing comfortably on room air without any increased work of breathing. She was lying flat without issue. Spoke to patient earlier in the evening in which she denied any chest pain or discomfort. - Will continue to monitor - Cards trending trops, EKG   Danna Hefty, DO 06/13/2020, 12:21 AM PGY-3, [facility] Service pager [phone]

## 2020-06-13 NOTE — Progress Notes (Signed)
Family Medicine Teaching Service Daily Progress Note Intern Pager: [phone]  Patient name: Cheryl Summers Medical record number: [id number] Date of birth: Jan 02, 1941 Age: 79 y.o. Gender: female  Primary Care Provider: Nicholos Johns, MD Consultants: Neurology, cardiology, palliative care   Code Status: DNR  Pt Overview and Major Events to Date:  8/18 Admitted  Assessment and Plan: Cheryl Summers is a 79 y.o. female presenting with unilateral left-sided weakness found to have CVA, outside of tPA window. Also with CHF exacerbation. PMH is significant formild dementia, CAD s/p CABG, T2DM, HTN, HLD, paroxysmal A. Fib,COPD, HFpEF.  CVA Acute left-sided weakness with evidence of right precentral gyrus infarct on imaging and exam, outside of tPA window. NIHSS 9 on presentation.CT head without contrast showed no evidence of intracranial hemorrhage. CTA head and neck showed evidence of small acute infarct of the right precentral gyrus. MRI with Exam with left-sided weakness, UE > LE,with mild facial droop, consistent with radiographic findings. - Neurology following, appreciate involvement - ASA 325 mg daily + clopidogrel 75 mg daily x 3 months (then plan for clopidogrel alone) per neuro - ASA suppository since not taking PO meds - atorvastatin 80 mg - permissive HTN up to 220/120, gradually normalize in 5-7 days per neuro - cardiac monitoring - frequent neuro checks - PT/OT - SLP  HFrEF In acute exacerbation, with dyspnea worse than baseline and pitting edema on presentation.  Recent decrease in medications possibly contributing.  Also with CXR suggestive of CHF and elevated BNP over 2100 compared to 415 on 8/2. TTE this admission with EF 30-35% with diffuse akinesis and severely decreased LV function, worse compared to prior echo 08/2019 showing EF 60-65%. Followed by cardiology.Recently reduced torsemide dose due toelevated Cr. Home meds: torsemide 20 mg MWF Responded well to diuresis  yesterday with good urine output, net -2.1L. Improved respiratory status this morning and appears euvolemic. Was started on low-dose beta-blocker and ARB yesterday given new HFrEF, while keeping in mind permissive HTN for post-CVA. May need to consider IV medications since refusing to swallow p.o. meds. - f/u cardiology recs - Holding diuresis today - metoprolol tartrate 12.5 mg BID, can consider switching to IV - losartan 12.5 mg  AMS History of dementia, but worsening mental status compared to baseline since admission.  Suspect CVA on top of underlying dementia will likely worsen her baseline mental status, and may have delirium as well in the setting of critical illness.  COPD Patient with history of COPD. Home meds: albuterol inhaler prn, albuterol neb prn, Symbicort, montelukast, torsemide. Respiratory status improved on home meds and diuresis. Ruthe Mannan (formulary) - Duoneb sch  - albuterol neb prn - montelukast  HTN Home meds: telmisartan, amlodipine - decreased clonidine 0.1 mg BID, restarted due to concerns for rebound HTN - metoprolol and losartan as above - holding other home meds given permissive HTN  Recent Falls  Weakness Patient with history of falls, thought to be multifactorial including gait disturbances and neuropathy. At baseline ambulates with a cane. Followed by Dr. Felecia Shelling. -PT/OT  CKD Apparently creatinine of 2.7 at cardiologist office 2 weeks ago. Patient's creatinine on admission2.07,GFR22.Unclear what baseline is, but improved from previous reading 2 weeks prior to admission. Likely CKD in the setting of T2DM and chronicHTN.  Normal vitamin D this admission.  Creatinine 1.64, likely around baseline. -renally adjust medications - avoid nephrotoxic meds  - monitor I/O   Dementia/Memory Loss Most recent MMSE 16 weeks ago, followed by Dr. Felecia Shelling. Home meds: Aricept and Namenda.  -  continue home meds  Meningioma As appreciated  onrecentMRI8/12, appears to be stable on MRI this admission.Seen by neurology 8/16.  T2DM A1c 5.8% this admission.Glucose 90 on admission.Appears to be well controlled. Home meds: [place].  Afib Patient with history of atrial fibrillation. She is not anticoagulated due to gait dysfunction, high risk of falls. -Cardiac monitoring  CAD History of CABG in 2012. Stable, no chest pain. Elevated troponin expected given known CAD.  HLD Home meds: atorvastatin, ezetimibe. LDL 83, goal < 70. - atorvastatin, ezetimibe   Lumbago Patient with history of low back pain, takes pregabalin. - continue pregabalin - prn Tylenol suppository  GERD Taking omeprazole at home. - pantoprazole  Goals of Care Lives alone.  Will likely have increased dependence following stroke.  From conversations between palliative care and daughter, daughter wishes patient to [facility].  Plan to place on CMO.  Appreciate palliative involvement.  FEN/GI: DYS 1 PPx: [place]  Disposition: med-surg, hospice when discharged  Subjective:  NAOE.  Daughter at bedside.  Continues to do well on room air.  Per daughter, patient has the ability to swallow, but has been refusing to take her medications.  Daughter notes decline in her mental status compared to baseline, states patient was not able to recognize daughter.  Daughter feels that the patient is in pain from her chronic back issues.  Daughter is worried about her overall prognosis.  Questions and concerns were addressed and answered.  Objective: Temp:  [98 F (36.7 C)-99.5 F (37.5 C)] 98.1 F (36.7 C) (08/20 0333) Pulse Rate:  [91-106] 96 (08/20 0333) Resp:  [18-28] 18 (08/20 0333) BP: (116-180)/(77-129) 130/77 (08/20 0333) SpO2:  [94 %-98 %] 98 % (08/20 0333) Physical Exam: General: Alert, elderly woman lying in bed, hard of hearing, NAD Cardiovascular: RRR, no murmurs Respiratory: CTAB, some abdominal breathing noted, no  tachypnea Abdomen: soft, non-tender Extremities: WWP, 1+ pitting edema Neuro: alert, disoriented (unable to state name), intermittently mumbling "okay", difficulty following commands but wiggles toes, able to lift up right arm, not moving left arm  Laboratory: Recent Labs  Lab 06/11/20 1211  WBC 19.0*  HGB 13.0  13.3  HCT 42.7  39.0  PLT 237   Recent Labs  Lab 06/11/20 1211 06/12/20 1041 06/13/20 0205  NA 145  144 144 144  K 4.6  4.3 4.1 3.4*  CL 111  113* 113* 110  CO2 18* 18* 22  BUN 28*  31* 26* 34*  CREATININE 1.89*  1.80* 1.53* 1.64*  CALCIUM 9.5 9.4 9.2  PROT 7.2  --   --   BILITOT 0.7  --   --   ALKPHOS 97  --   --   ALT 25  --   --   AST 40  --   --   GLUCOSE 112*  112* 191* 130*   Troponin 2943 -> 3488 Vitamin D 75  Imaging/Diagnostic Tests: TTE IMPRESSIONS  1. LVEF is depressed with akinesis of the mid/distal septal, mid/distal  inferior, distal anterior, distal lateral and apical walls. COmpared to  echo in 2020, these changes are new.. Left ventricular ejection fraction,  by estimation, is 30 to 35%. The  left ventricle has moderate to severely decreased function. There is mild  left ventricular hypertrophy. Left ventricular diastolic parameters are  indeterminate.  2. Right ventricular systolic function is normal. The right ventricular  size is normal. There is severely elevated pulmonary artery systolic  pressure.  3. Left atrial size was mildly dilated.  4. The mitral valve is normal in structure. Mild mitral valve  regurgitation.  5. Tricuspid valve regurgitation is mild to moderate.  6. The aortic valve is abnormal. Aortic valve regurgitation is trivial.  Mild aortic valve sclerosis is present, with no evidence of aortic valve  stenosis.   Zola Button, MD 06/13/2020, 7:51 AM PGY-1, [place] Intern pager: [phone], text pages welcome

## 2020-06-13 NOTE — Progress Notes (Signed)
Chaplain responded to Spiritual Consult for EOL support.  Pt sleeping.  Chaplain offered silent prayer at bedside.

## 2020-06-13 NOTE — Progress Notes (Signed)
[facility]  Discussed with the daughter [name]-- services of hospice house in [place]. She confirms goals are comfort care. She wants to proceed with comfort care. Spoke to our MD Dr. Darcel Smalling and he has approved the pt to come to [place] and daughter has accepted bed offer. She will meet [place] at 100pm to do paperwork. I spoke to [place] and she will arrange d/c paperwork to be completed and set up ambulance for 300-330pm.   RN can call report to [place] at [phone]  Thank you for this referral. Webb Silversmith RN

## 2020-06-13 NOTE — Discharge Instructions (Signed)
Dear Cheryl Summers,   Thank you so much for allowing [place] to be part of your care!  You were admitted to [facility] for a stroke. [name] was also found to have worsening heart failure. These were initially treated with medications but these were stopped after she was put on comfort measures. She will only be on medications to help her be more comfortable.   POST-HOSPITAL & CARE INSTRUCTIONS 1. Please let PCP/Specialists know of any changes that were made.  2. Please see medications section of this packet for any medication changes.   DOCTOR'S APPOINTMENT & FOLLOW UP CARE INSTRUCTIONS  Future Appointments  Date Time Provider [place]  12/02/2020  3:20 PM Richardo Priest, MD [facility] None  06/02/2021  4:00 PM Sater, Nanine Means, MD [facility] None    RETURN PRECAUTIONS:   Take care and be well!  [facility]  [place], [place] [address] [phone]

## 2020-06-14 LAB — PTH, INTACT AND CALCIUM
Calcium, Total (PTH): 9.5 mg/dL (ref 8.7–10.3)
PTH: 49 pg/mL (ref 15–65)

## 2020-06-25 DEATH — deceased

## 2020-06-26 ENCOUNTER — Other Ambulatory Visit: Payer: PPO

## 2020-08-23 IMAGING — MR MR HEAD W/O CM
13 of 14 series · 44 of 48 positions shown · non-contrast
Comparison: CT angio head and neck [phone].  MRI head 06/05/2020

CLINICAL DATA: Stroke.  Left-sided weakness.

EXAM:
MRI HEAD WITHOUT CONTRAST
TECHNIQUE: Multiplanar, multiecho pulse sequences of the brain and surrounding
structures were obtained without intravenous contrast.

[Series 5: DWI · axial · 3.0mm · 0.88mm/px · z∈[-74,+78]mm · 6 of 104 slices shown (1 of 4)]
[im 1/104]
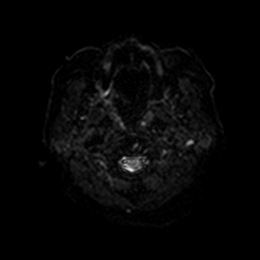
[im 21/104]
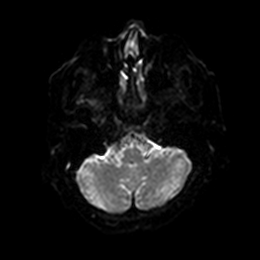
[im 42/104]
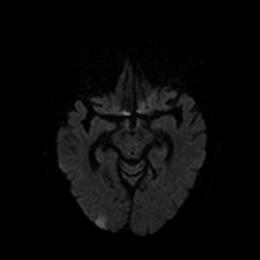
[im 62/104]
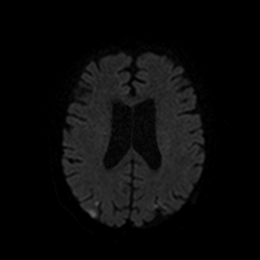
[im 83/104]
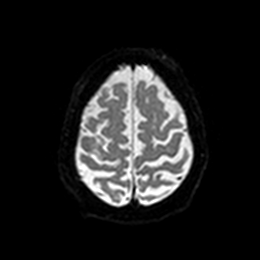
[im 104/104]
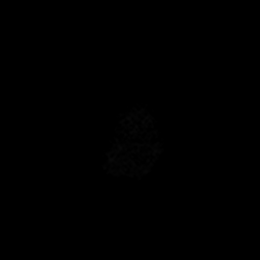

[Series 6: DWI · axial · 3.0mm · 0.88mm/px · z∈[-74,+78]mm · 4 of 52 slices shown (2 of 4)]
[im 1/52]
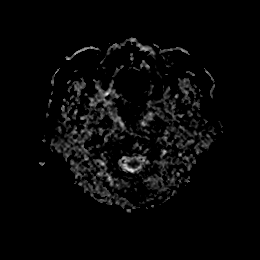
[im 18/52]
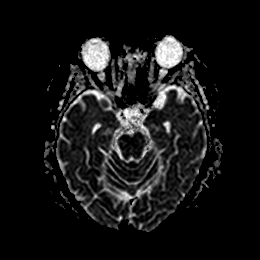
[im 35/52]
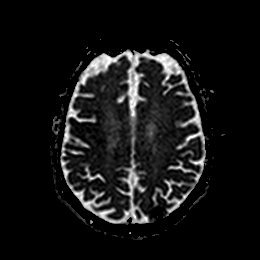
[im 52/52]
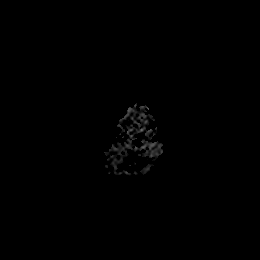

[Series 7: DWI · coronal · 4.0mm · 0.88mm/px · 5 of 76 slices shown (3 of 4)]
[im 1/76]
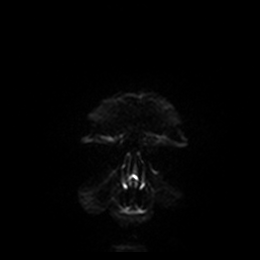
[im 19/76]
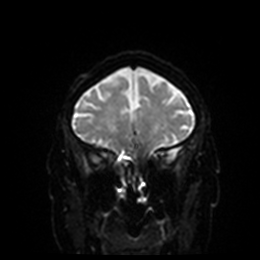
[im 38/76]
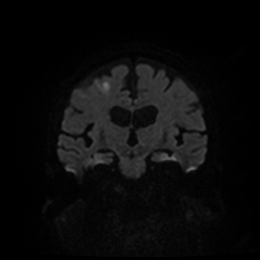
[im 57/76]
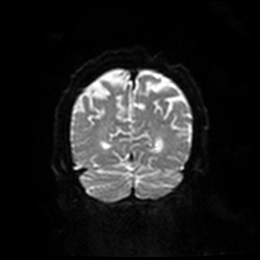
[im 76/76]
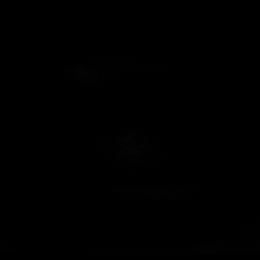

[Series 8: DWI · coronal · 4.0mm · 0.88mm/px · 3 of 38 slices shown (4 of 4)]
[im 1/38]
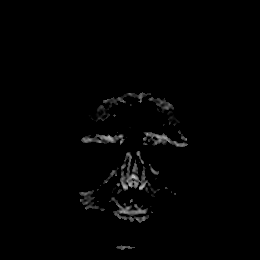
[im 19/38]
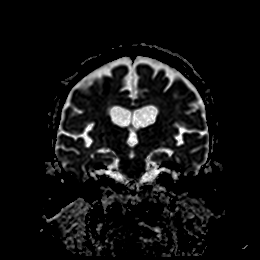
[im 38/38]
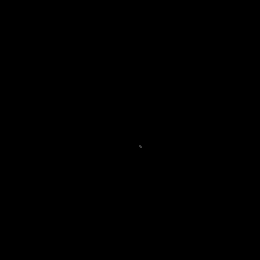

[Series 9: T2 · axial · 5.0mm · 0.72mm/px · z∈[-71,+78]mm · 2 of 26 slices shown (1 of 2)]
[im 1/26]
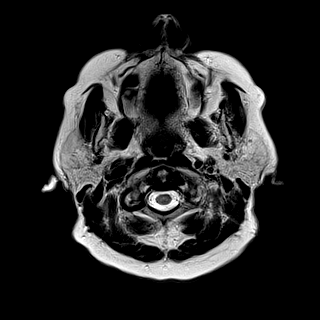
[im 26/26]
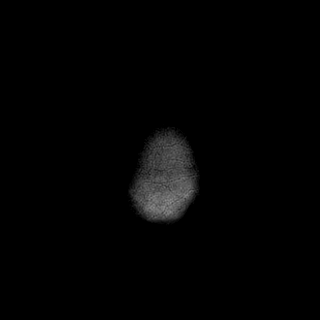

[Series 10: T1 · sagittal · 5.0mm · 0.75mm/px · 2 of 25 slices shown (1 of 2)]
[im 1/25]
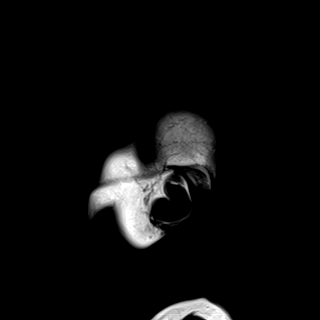
[im 25/25]
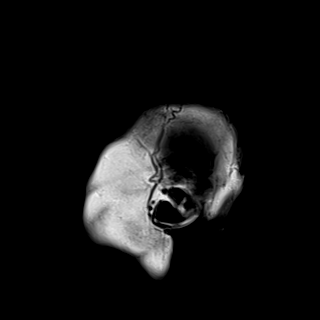

[Series 11: FLAIR · axial · 5.0mm · 0.90mm/px · z∈[-74,+81]mm · 2 of 27 slices shown]
[im 1/27]
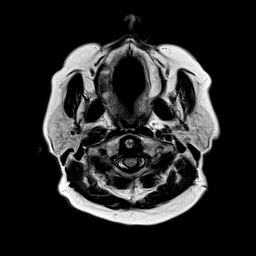
[im 27/27]
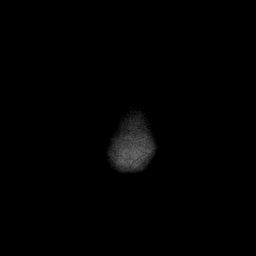

[Series 12: mag_images · axial · 3.0mm · 0.90mm/px · z∈[-77,+87]mm · 4 of 56 slices shown]
[im 1/56]
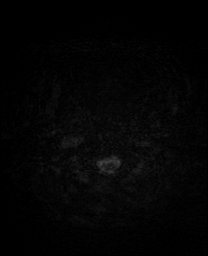
[im 19/56]
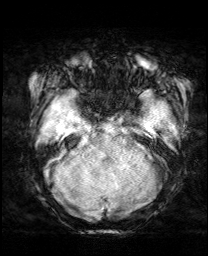
[im 37/56]
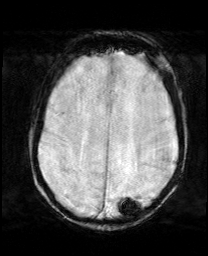
[im 56/56]
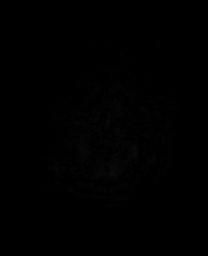

[Series 13: pha_images · axial · 3.0mm · 0.90mm/px · z∈[-71,+87]mm · 4 of 52 slices shown]
[im 1/52]
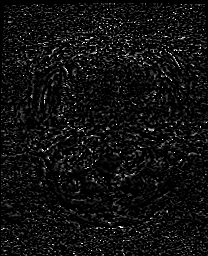
[im 18/52]
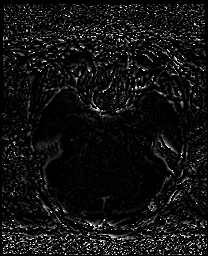
[im 35/52]
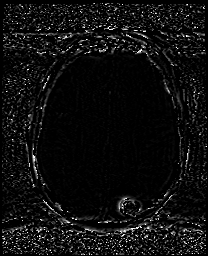
[im 52/52]
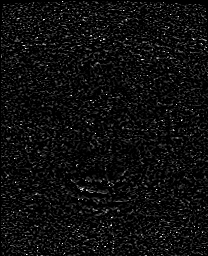

[Series 14: swi_images · axial · 3.0mm · 0.90mm/px · z∈[-77,+87]mm · 4 of 56 slices shown]
[im 1/56]
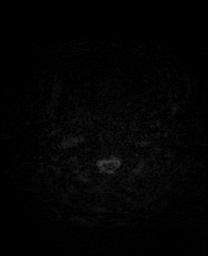
[im 19/56]
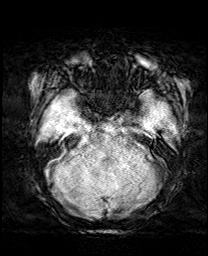
[im 37/56]
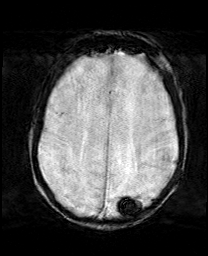
[im 56/56]
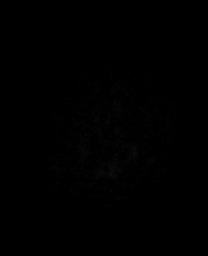

[Series 15: mip_images(sw) · axial · 24.0mm · 0.90mm/px · z∈[-67,+76]mm · 4 of 49 slices shown]
[im 1/49]
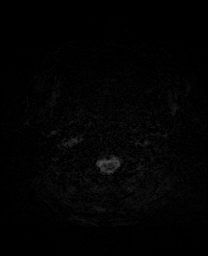
[im 17/49]
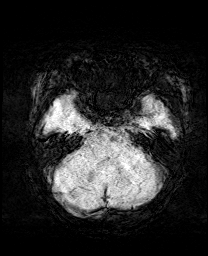
[im 33/49]
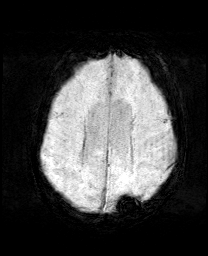
[im 49/49]
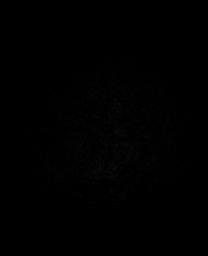

[Series 17: T2 · coronal · 5.0mm · 0.72mm/px · 2 of 28 slices shown (2 of 2)]
[im 1/28]
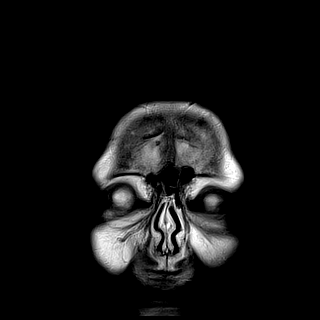
[im 28/28]
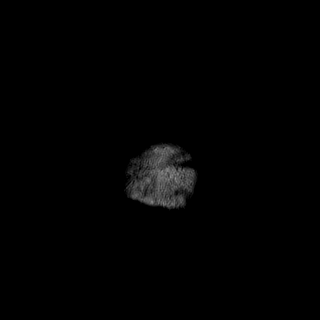

[Series 18: T1 · sagittal · 5.0mm · 0.90mm/px · 2 of 25 slices shown (2 of 2)]
[im 1/25]
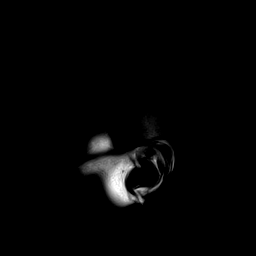
[im 25/25]
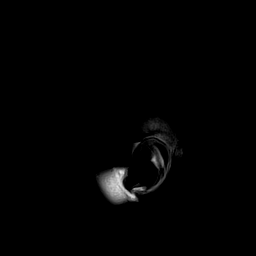

[44 of 48 positions shown; findings below may reference images not displayed]

FINDINGS: Brain: Acute infarct in the right frontal parietal lobe extending to
the occipital lobe. This is a watershed territory infarct. This was
not present on the recent MRI of 06/05/2020.

Calcific extra-axial mass in the left posterior parietal lobe
measuring 18 mm compatible with meningioma. No brain edema.

Generalized atrophy with mild chronic microvascular ischemic change
in the white matter and pons. Chronic microhemorrhage in the left
middle frontal lobe.

Vascular: Normal arterial flow voids with stenosis in the distal
left vertebral artery.

Skull and upper cervical spine: No focal skeletal lesion.

Sinuses/Orbits: Mild mucosal edema paranasal sinuses. Bilateral
cataract extraction

Other: None
IMPRESSION: Acute infarct right frontal parietal lobe extending to the occipital
lobe. This is a watershed territory infarct without associated
hemorrhage.

Atrophy and mild chronic microvascular ischemic change

18 mm calcified meningioma left posterior parietal lobe. No brain
edema.

## 2020-11-17 DIAGNOSIS — F419 Anxiety disorder, unspecified: Secondary | ICD-10-CM | POA: Insufficient documentation

## 2020-11-17 DIAGNOSIS — R251 Tremor, unspecified: Secondary | ICD-10-CM | POA: Insufficient documentation

## 2020-11-17 DIAGNOSIS — K219 Gastro-esophageal reflux disease without esophagitis: Secondary | ICD-10-CM | POA: Insufficient documentation

## 2020-11-17 DIAGNOSIS — F32A Depression, unspecified: Secondary | ICD-10-CM | POA: Insufficient documentation

## 2020-11-17 DIAGNOSIS — E119 Type 2 diabetes mellitus without complications: Secondary | ICD-10-CM | POA: Insufficient documentation

## 2020-11-17 DIAGNOSIS — I499 Cardiac arrhythmia, unspecified: Secondary | ICD-10-CM | POA: Insufficient documentation

## 2020-12-02 ENCOUNTER — Ambulatory Visit: Payer: Self-pay | Admitting: Cardiology

## 2021-05-26 ENCOUNTER — Telehealth: Payer: Self-pay | Admitting: Neurology

## 2021-05-26 NOTE — Telephone Encounter (Signed)
Was notified today that the patient has passed away. I will make sure that Dr Felecia Shelling is aware.

## 2021-06-02 ENCOUNTER — Ambulatory Visit: Payer: Self-pay | Admitting: Neurology
